# Patient Record
Sex: Male | Born: 1960 | Race: White | Hispanic: No | Marital: Married | Smoking: Former smoker
Health system: Southern US, Community
[De-identification: ages and names within clinical notes are randomized; demographics above are authoritative.]

## PROBLEM LIST (undated history)

## (undated) DIAGNOSIS — M1990 Unspecified osteoarthritis, unspecified site: Secondary | ICD-10-CM

## (undated) DIAGNOSIS — I10 Essential (primary) hypertension: Secondary | ICD-10-CM

## (undated) DIAGNOSIS — C884 Extranodal marginal zone b-cell lymphoma of mucosa-associated lymphoid tissue (malt-lymphoma) not having achieved remission: Secondary | ICD-10-CM

## (undated) DIAGNOSIS — E119 Type 2 diabetes mellitus without complications: Secondary | ICD-10-CM

## (undated) DIAGNOSIS — C801 Malignant (primary) neoplasm, unspecified: Secondary | ICD-10-CM

## (undated) DIAGNOSIS — G2581 Restless legs syndrome: Secondary | ICD-10-CM

## (undated) DIAGNOSIS — E669 Obesity, unspecified: Secondary | ICD-10-CM

## (undated) DIAGNOSIS — G894 Chronic pain syndrome: Secondary | ICD-10-CM

## (undated) DIAGNOSIS — E785 Hyperlipidemia, unspecified: Secondary | ICD-10-CM

## (undated) DIAGNOSIS — I2510 Atherosclerotic heart disease of native coronary artery without angina pectoris: Secondary | ICD-10-CM

## (undated) DIAGNOSIS — R918 Other nonspecific abnormal finding of lung field: Secondary | ICD-10-CM

## (undated) DIAGNOSIS — N184 Chronic kidney disease, stage 4 (severe): Secondary | ICD-10-CM

## (undated) DIAGNOSIS — I700 Atherosclerosis of aorta: Secondary | ICD-10-CM

## (undated) DIAGNOSIS — E041 Nontoxic single thyroid nodule: Secondary | ICD-10-CM

## (undated) DIAGNOSIS — G629 Polyneuropathy, unspecified: Secondary | ICD-10-CM

## (undated) DIAGNOSIS — I509 Heart failure, unspecified: Secondary | ICD-10-CM

## (undated) HISTORY — DX: Hyperlipidemia, unspecified: E78.5

## (undated) HISTORY — DX: Extranodal marginal zone B-cell lymphoma of mucosa-associated lymphoid tissue (MALT-lymphoma): C88.4

## (undated) HISTORY — DX: Type 2 diabetes mellitus without complications: E11.9

## (undated) HISTORY — PX: EYE SURGERY: SHX253

## (undated) HISTORY — DX: Chronic pain syndrome: G89.4

## (undated) HISTORY — PX: PERITONEAL CATHETER REMOVAL: SUR1106

## (undated) HISTORY — DX: Restless legs syndrome: G25.81

## (undated) HISTORY — DX: Essential (primary) hypertension: I10

## (undated) HISTORY — DX: Other nonspecific abnormal finding of lung field: R91.8

## (undated) HISTORY — DX: Polyneuropathy, unspecified: G62.9

## (undated) HISTORY — PX: LUNG LOBECTOMY: SHX167

## (undated) HISTORY — DX: Nontoxic single thyroid nodule: E04.1

## (undated) HISTORY — PX: MOUTH SURGERY: SHX715

## (undated) HISTORY — DX: Extranodal marginal zone b-cell lymphoma of mucosa-associated lymphoid tissue (malt-lymphoma) not having achieved remission: C88.40

## (undated) HISTORY — PX: POLYPECTOMY: SHX149

## (undated) HISTORY — PX: EYE EXAMINATION UNDER ANESTHESIA W/ RETINAL CRYOTHERAPY AND RETINAL LASER: SHX1561

## (undated) HISTORY — DX: Atherosclerosis of aorta: I70.0

## (undated) HISTORY — PX: PERITONEAL CATHETER INSERTION: SHX2223

## (undated) HISTORY — DX: Unspecified osteoarthritis, unspecified site: M19.90

## (undated) HISTORY — PX: TONSILLECTOMY: SUR1361

## (undated) HISTORY — DX: Atherosclerotic heart disease of native coronary artery without angina pectoris: I25.10

## (undated) HISTORY — DX: Obesity, unspecified: E66.9

---

## 2007-12-02 HISTORY — PX: CERVICAL SPINE SURGERY: SHX589

## 2011-05-17 ENCOUNTER — Institutional Professional Consult (permissible substitution) (INDEPENDENT_AMBULATORY_CARE_PROVIDER_SITE_OTHER): Payer: Medicare Other | Admitting: Cardiothoracic Surgery

## 2011-05-17 ENCOUNTER — Other Ambulatory Visit: Payer: Self-pay | Admitting: Cardiothoracic Surgery

## 2011-05-17 ENCOUNTER — Encounter: Payer: Self-pay | Admitting: Cardiothoracic Surgery

## 2011-05-17 ENCOUNTER — Encounter: Payer: Medicare Other | Admitting: Cardiothoracic Surgery

## 2011-05-17 VITALS — BP 157/89 | HR 66 | Temp 98.0°F | Resp 16 | Ht 71.0 in | Wt 250.0 lb

## 2011-05-17 DIAGNOSIS — D381 Neoplasm of uncertain behavior of trachea, bronchus and lung: Secondary | ICD-10-CM

## 2011-05-17 DIAGNOSIS — G2581 Restless legs syndrome: Secondary | ICD-10-CM

## 2011-05-17 DIAGNOSIS — R911 Solitary pulmonary nodule: Secondary | ICD-10-CM | POA: Insufficient documentation

## 2011-05-17 DIAGNOSIS — I10 Essential (primary) hypertension: Secondary | ICD-10-CM

## 2011-05-17 DIAGNOSIS — I700 Atherosclerosis of aorta: Secondary | ICD-10-CM | POA: Insufficient documentation

## 2011-05-17 DIAGNOSIS — E119 Type 2 diabetes mellitus without complications: Secondary | ICD-10-CM

## 2011-05-17 DIAGNOSIS — R918 Other nonspecific abnormal finding of lung field: Secondary | ICD-10-CM

## 2011-05-17 DIAGNOSIS — G894 Chronic pain syndrome: Secondary | ICD-10-CM

## 2011-05-17 DIAGNOSIS — E785 Hyperlipidemia, unspecified: Secondary | ICD-10-CM | POA: Insufficient documentation

## 2011-05-17 DIAGNOSIS — E041 Nontoxic single thyroid nodule: Secondary | ICD-10-CM

## 2011-05-17 DIAGNOSIS — E669 Obesity, unspecified: Secondary | ICD-10-CM

## 2011-05-17 DIAGNOSIS — M1990 Unspecified osteoarthritis, unspecified site: Secondary | ICD-10-CM

## 2011-05-17 DIAGNOSIS — G629 Polyneuropathy, unspecified: Secondary | ICD-10-CM
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
## 2011-05-17 NOTE — Patient Instructions (Addendum)
Call your primary MD for treatment of gout. Stop taking amitriptyline. Return to office after PET scan to discuss lung surgery.

## 2011-05-23 ENCOUNTER — Encounter (HOSPITAL_COMMUNITY)
Admission: RE | Admit: 2011-05-23 | Discharge: 2011-05-23 | Disposition: A | Discharge status: Home / Self Care | Payer: Medicare Other | Source: Ambulatory Visit | Attending: Cardiothoracic Surgery | Admitting: Cardiothoracic Surgery

## 2011-05-23 ENCOUNTER — Encounter (HOSPITAL_COMMUNITY): Payer: Self-pay

## 2011-05-23 DIAGNOSIS — J984 Other disorders of lung: Secondary | ICD-10-CM | POA: Insufficient documentation

## 2011-05-23 DIAGNOSIS — D381 Neoplasm of uncertain behavior of trachea, bronchus and lung: Secondary | ICD-10-CM

## 2011-05-23 DIAGNOSIS — R222 Localized swelling, mass and lump, trunk: Secondary | ICD-10-CM | POA: Insufficient documentation

## 2011-05-23 LAB — GLUCOSE, CAPILLARY: Glucose-Capillary: 163 mg/dL — ABNORMAL HIGH (ref 70–99)

## 2011-05-23 MED ORDER — FLUDEOXYGLUCOSE F - 18 (FDG) INJECTION
18.5000 | Freq: Once | INTRAVENOUS | Status: AC | PRN
  Administered 2011-05-23: 18.5 via INTRAVENOUS

## 2011-05-24 ENCOUNTER — Ambulatory Visit (INDEPENDENT_AMBULATORY_CARE_PROVIDER_SITE_OTHER): Payer: Medicare Other | Admitting: Cardiothoracic Surgery

## 2011-05-24 VITALS — BP 163/82 | HR 67 | Resp 17 | Ht 71.0 in | Wt 259.0 lb

## 2011-05-24 DIAGNOSIS — R918 Other nonspecific abnormal finding of lung field: Secondary | ICD-10-CM

## 2011-05-24 DIAGNOSIS — J984 Other disorders of lung: Secondary | ICD-10-CM

## 2011-05-24 DIAGNOSIS — R911 Solitary pulmonary nodule: Secondary | ICD-10-CM

## 2011-05-24 NOTE — Progress Notes (Signed)
HPI  The patient returns for followup discussion of a recently diagnosed 1.5 cm nodule in the superior segment of the right lower lobe. He is a nonsmoker x20 years. Since his original consult he has undergone a PET scan. The PET scan is reviewed with the patient and shows no hypermetabolic activity of the solitary lung nodule. This is consistent with broncho- alveolar type lung cancer. Of importance the mediastinal lymph nodes were all negative. Also of note the right thyroid nodule the patient was recent diagnosed with is also negative on PET scan.  Current Outpatient Prescriptions  Medication Sig Dispense Refill  . aspirin 81 MG tablet Take 81 mg by mouth daily.        . febuxostat (ULORIC) 40 MG tablet Take 80 mg by mouth daily.        . furosemide (LASIX) 80 MG tablet Take 80 mg by mouth daily.        Marland Kitchen gabapentin (NEURONTIN) 600 MG tablet Take 600 mg by mouth 3 (three) times daily.        . insulin aspart (NOVOLOG) 100 UNIT/ML injection Inject into the skin 3 (three) times daily before meals.        . insulin aspart protamine-insulin aspart (NOVOLOG 70/30) (70-30) 100 UNIT/ML injection Inject 35 Units into the skin 2 (two) times daily with a meal.        . insulin glargine (LANTUS) 100 UNIT/ML injection Inject into the skin at bedtime. 54untis am and 80units pm       . lisinopril (PRINIVIL,ZESTRIL) 40 MG tablet Take 40 mg by mouth daily.        . metFORMIN (GLUCOPHAGE) 1000 MG tablet Take 1,000 mg by mouth 2 (two) times daily with a meal.        . nebivolol (BYSTOLIC) 10 MG tablet Take 10 mg by mouth daily.        . nitroGLYCERIN (NITROLINGUAL) 0.4 MG/SPRAY spray Place 1 spray under the tongue every 5 (five) minutes as needed.        Marland Kitchen oxyCODONE (OXY IR/ROXICODONE) 5 MG immediate release tablet Take 5 mg by mouth every 6 (six) hours as needed.        . Tamsulosin HCl (FLOMAX) 0.4 MG CAPS Take 0.4 mg by mouth daily.           Review of Systems: No pulmonary symptoms. No change.  Physical  Exam  Blood pressure 158/80 pulse 60 and regular saturation rare 97% General appearance is a middle-aged Caucasian male no distress. Neck is without adenopathy palpable. Cardiac rhythm is regular without gallop. Breath sounds are clear and equal. Neuro exam is intact.   Diagnostic Tests: The PET scan is reviewed with the patient as noted above.   Impression: Solitary nodule in the superior segment right lower lobe with negative activity and PET scan is consistent with a broncoalveolar or type lung cancer.  Plan: I discussed the golden impressed with the patient. Offered a repeat CT scan in 3-6 months time for comparison e wishes to have this nodule removed which is a reasonable option.  He is in the process of buying and moving into new home so we will 22nd right vats segmental resection of the superior segment right lower lobe. The patient will return for a preop office visit on October 10.

## 2011-05-29 ENCOUNTER — Encounter: Payer: Medicare Other | Admitting: Cardiothoracic Surgery

## 2011-06-04 ENCOUNTER — Other Ambulatory Visit: Payer: Self-pay | Admitting: Cardiothoracic Surgery

## 2011-06-04 DIAGNOSIS — D381 Neoplasm of uncertain behavior of trachea, bronchus and lung: Secondary | ICD-10-CM

## 2011-06-12 ENCOUNTER — Ambulatory Visit (INDEPENDENT_AMBULATORY_CARE_PROVIDER_SITE_OTHER): Payer: Medicare Other | Admitting: Cardiothoracic Surgery

## 2011-06-12 ENCOUNTER — Ambulatory Visit: Payer: Medicare Other | Admitting: Cardiothoracic Surgery

## 2011-06-12 ENCOUNTER — Encounter: Payer: Self-pay | Admitting: Cardiothoracic Surgery

## 2011-06-12 ENCOUNTER — Ambulatory Visit
Admission: RE | Admit: 2011-06-12 | Discharge: 2011-06-12 | Disposition: A | Discharge status: Home / Self Care | Payer: Medicare Other | Source: Ambulatory Visit | Attending: Cardiothoracic Surgery | Admitting: Cardiothoracic Surgery

## 2011-06-12 VITALS — BP 132/76 | HR 80 | Resp 20 | Ht 71.0 in | Wt 254.0 lb

## 2011-06-12 DIAGNOSIS — J984 Other disorders of lung: Secondary | ICD-10-CM

## 2011-06-12 DIAGNOSIS — R911 Solitary pulmonary nodule: Secondary | ICD-10-CM

## 2011-06-12 DIAGNOSIS — D381 Neoplasm of uncertain behavior of trachea, bronchus and lung: Secondary | ICD-10-CM

## 2011-06-12 NOTE — Patient Instructions (Signed)
Stop aspirin a week before surgery 
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
## 2011-06-20 ENCOUNTER — Encounter (HOSPITAL_COMMUNITY)
Admission: RE | Admit: 2011-06-20 | Discharge: 2011-06-20 | Disposition: A | Discharge status: Home / Self Care | Payer: Medicare Other | Source: Ambulatory Visit | Attending: Cardiothoracic Surgery | Admitting: Cardiothoracic Surgery

## 2011-06-20 ENCOUNTER — Other Ambulatory Visit: Payer: Self-pay | Admitting: Cardiothoracic Surgery

## 2011-06-20 DIAGNOSIS — Z01811 Encounter for preprocedural respiratory examination: Secondary | ICD-10-CM

## 2011-06-20 LAB — ABO/RH: ABO/RH(D): A POS

## 2011-06-20 LAB — COMPREHENSIVE METABOLIC PANEL
ALT: 36 U/L (ref 0–53)
AST: 23 U/L (ref 0–37)
Albumin: 3.5 g/dL (ref 3.5–5.2)
Alkaline Phosphatase: 76 U/L (ref 39–117)
BUN: 21 mg/dL (ref 6–23)
CO2: 26 mEq/L (ref 19–32)
Calcium: 9.9 mg/dL (ref 8.4–10.5)
Chloride: 102 mEq/L (ref 96–112)
Creatinine, Ser: 1.09 mg/dL (ref 0.50–1.35)
GFR calc Af Amer: 88 mL/min — ABNORMAL LOW (ref >90)
GFR calc non Af Amer: 76 mL/min — ABNORMAL LOW (ref >90)
Glucose, Bld: 171 mg/dL — ABNORMAL HIGH (ref 70–99)
Potassium: 3.8 mEq/L (ref 3.5–5.1)
Sodium: 141 mEq/L (ref 135–145)
Total Bilirubin: 0.4 mg/dL (ref 0.3–1.2)
Total Protein: 6.7 g/dL (ref 6.0–8.3)

## 2011-06-20 LAB — SURGICAL PCR SCREEN
MRSA, PCR: NEGATIVE
Staphylococcus aureus: NEGATIVE

## 2011-06-20 LAB — CBC
HCT: 35.9 % — ABNORMAL LOW (ref 39.0–52.0)
Hemoglobin: 13.2 g/dL (ref 13.0–17.0)
MCH: 31.4 pg (ref 26.0–34.0)
MCHC: 36.8 g/dL — ABNORMAL HIGH (ref 30.0–36.0)
MCV: 85.5 fL (ref 78.0–100.0)
Platelets: 230 10*3/uL (ref 150–400)
RBC: 4.2 MIL/uL — ABNORMAL LOW (ref 4.22–5.81)
RDW: 13.1 % (ref 11.5–15.5)
WBC: 10.6 10*3/uL — ABNORMAL HIGH (ref 4.0–10.5)

## 2011-06-20 LAB — URINALYSIS, ROUTINE W REFLEX MICROSCOPIC
Bilirubin Urine: NEGATIVE
Glucose, UA: NEGATIVE mg/dL
Hgb urine dipstick: NEGATIVE
Ketones, ur: NEGATIVE mg/dL
Leukocytes, UA: NEGATIVE
Nitrite: NEGATIVE
Protein, ur: 100 mg/dL — AB
Specific Gravity, Urine: 1.011 (ref 1.005–1.030)
Urobilinogen, UA: 0.2 mg/dL (ref 0.0–1.0)
pH: 5 (ref 5.0–8.0)

## 2011-06-20 LAB — URINE MICROSCOPIC-ADD ON

## 2011-06-20 LAB — BLOOD GAS, ARTERIAL
Acid-Base Excess: 2.8 mmol/L — ABNORMAL HIGH (ref 0.0–2.0)
Bicarbonate: 26.5 mEq/L — ABNORMAL HIGH (ref 20.0–24.0)
Drawn by: 344381
FIO2: 21 %
O2 Saturation: 99.8 %
Patient temperature: 98.6
TCO2: 27.7 mmol/L (ref 0–100)
pCO2 arterial: 38.5 mmHg (ref 35.0–45.0)
pH, Arterial: 7.451 — ABNORMAL HIGH (ref 7.350–7.450)
pO2, Arterial: 115 mmHg — ABNORMAL HIGH (ref 80.0–100.0)

## 2011-06-20 LAB — APTT: aPTT: 29 seconds (ref 24–37)

## 2011-06-20 LAB — PROTIME-INR
INR: 0.99 (ref 0.00–1.49)
Prothrombin Time: 13.3 seconds (ref 11.6–15.2)

## 2011-06-24 ENCOUNTER — Other Ambulatory Visit: Payer: Self-pay | Admitting: Cardiothoracic Surgery

## 2011-06-24 ENCOUNTER — Inpatient Hospital Stay (HOSPITAL_COMMUNITY): Payer: Medicare Other

## 2011-06-24 ENCOUNTER — Inpatient Hospital Stay (HOSPITAL_COMMUNITY)
Admission: RE | Admit: 2011-06-24 | Discharge: 2011-06-28 | DRG: 822 | Disposition: A | Discharge status: Home / Self Care | Payer: Medicare Other | Source: Ambulatory Visit | Attending: Cardiothoracic Surgery | Admitting: Cardiothoracic Surgery

## 2011-06-24 DIAGNOSIS — Z01818 Encounter for other preprocedural examination: Secondary | ICD-10-CM

## 2011-06-24 DIAGNOSIS — D381 Neoplasm of uncertain behavior of trachea, bronchus and lung: Secondary | ICD-10-CM

## 2011-06-24 DIAGNOSIS — Z87891 Personal history of nicotine dependence: Secondary | ICD-10-CM

## 2011-06-24 DIAGNOSIS — Z7982 Long term (current) use of aspirin: Secondary | ICD-10-CM

## 2011-06-24 DIAGNOSIS — E669 Obesity, unspecified: Secondary | ICD-10-CM | POA: Diagnosis present

## 2011-06-24 DIAGNOSIS — Z01812 Encounter for preprocedural laboratory examination: Secondary | ICD-10-CM

## 2011-06-24 DIAGNOSIS — Z79899 Other long term (current) drug therapy: Secondary | ICD-10-CM

## 2011-06-24 DIAGNOSIS — I10 Essential (primary) hypertension: Secondary | ICD-10-CM | POA: Diagnosis present

## 2011-06-24 DIAGNOSIS — I700 Atherosclerosis of aorta: Secondary | ICD-10-CM | POA: Diagnosis present

## 2011-06-24 DIAGNOSIS — G2581 Restless legs syndrome: Secondary | ICD-10-CM | POA: Diagnosis present

## 2011-06-24 DIAGNOSIS — Z01810 Encounter for preprocedural cardiovascular examination: Secondary | ICD-10-CM

## 2011-06-24 DIAGNOSIS — E119 Type 2 diabetes mellitus without complications: Secondary | ICD-10-CM | POA: Diagnosis present

## 2011-06-24 DIAGNOSIS — G894 Chronic pain syndrome: Secondary | ICD-10-CM | POA: Diagnosis present

## 2011-06-24 DIAGNOSIS — C884 Extranodal marginal zone b-cell lymphoma of mucosa-associated lymphoid tissue (malt-lymphoma) not having achieved remission: Secondary | ICD-10-CM

## 2011-06-24 DIAGNOSIS — E785 Hyperlipidemia, unspecified: Secondary | ICD-10-CM | POA: Diagnosis present

## 2011-06-24 DIAGNOSIS — C8589 Other specified types of non-Hodgkin lymphoma, extranodal and solid organ sites: Principal | ICD-10-CM | POA: Diagnosis present

## 2011-06-24 DIAGNOSIS — Z794 Long term (current) use of insulin: Secondary | ICD-10-CM

## 2011-06-24 HISTORY — DX: Extranodal marginal zone B-cell lymphoma of mucosa-associated lymphoid tissue (MALT-lymphoma): C88.4

## 2011-06-24 HISTORY — DX: Extranodal marginal zone b-cell lymphoma of mucosa-associated lymphoid tissue (malt-lymphoma) not having achieved remission: C88.40

## 2011-06-24 LAB — POCT I-STAT 3, ART BLOOD GAS (G3+)
Acid-Base Excess: 3 mmol/L — ABNORMAL HIGH (ref 0.0–2.0)
Bicarbonate: 28.8 mEq/L — ABNORMAL HIGH (ref 20.0–24.0)
O2 Saturation: 98 %
Patient temperature: 97.5
TCO2: 30 mmol/L (ref 0–100)
pCO2 arterial: 46.9 mmHg — ABNORMAL HIGH (ref 35.0–45.0)
pH, Arterial: 7.394 (ref 7.350–7.450)
pO2, Arterial: 97 mmHg (ref 80.0–100.0)

## 2011-06-24 LAB — BLOOD GAS, ARTERIAL
Acid-base deficit: 1 mmol/L (ref 0.0–2.0)
Bicarbonate: 24.6 mEq/L — ABNORMAL HIGH (ref 20.0–24.0)
Drawn by: 296031
O2 Content: 2 L/min
O2 Saturation: 97.9 %
Patient temperature: 98.6
TCO2: 26.3 mmol/L (ref 0–100)
pCO2 arterial: 52.6 mmHg — ABNORMAL HIGH (ref 35.0–45.0)
pH, Arterial: 7.292 — ABNORMAL LOW (ref 7.350–7.450)
pO2, Arterial: 99.5 mmHg (ref 80.0–100.0)

## 2011-06-24 LAB — GLUCOSE, CAPILLARY
Glucose-Capillary: 302 mg/dL — ABNORMAL HIGH (ref 70–99)
Glucose-Capillary: 265 mg/dL — ABNORMAL HIGH (ref 70–99)
Glucose-Capillary: 231 mg/dL — ABNORMAL HIGH (ref 70–99)
Glucose-Capillary: 211 mg/dL — ABNORMAL HIGH (ref 70–99)
Glucose-Capillary: 188 mg/dL — ABNORMAL HIGH (ref 70–99)

## 2011-06-25 ENCOUNTER — Inpatient Hospital Stay (HOSPITAL_COMMUNITY): Payer: Medicare Other

## 2011-06-25 DIAGNOSIS — E1165 Type 2 diabetes mellitus with hyperglycemia: Secondary | ICD-10-CM

## 2011-06-25 DIAGNOSIS — IMO0001 Reserved for inherently not codable concepts without codable children: Secondary | ICD-10-CM

## 2011-06-25 LAB — TYPE AND SCREEN
ABO/RH(D): A POS
Antibody Screen: NEGATIVE
Unit division: 0
Unit division: 0

## 2011-06-25 LAB — BASIC METABOLIC PANEL
BUN: 27 mg/dL — ABNORMAL HIGH (ref 6–23)
CO2: 28 mEq/L (ref 19–32)
Calcium: 9.1 mg/dL (ref 8.4–10.5)
Chloride: 101 mEq/L (ref 96–112)
Creatinine, Ser: 1.13 mg/dL (ref 0.50–1.35)
GFR calc Af Amer: 85 mL/min — ABNORMAL LOW (ref >90)
GFR calc non Af Amer: 73 mL/min — ABNORMAL LOW (ref >90)
Glucose, Bld: 180 mg/dL — ABNORMAL HIGH (ref 70–99)
Potassium: 4.2 mEq/L (ref 3.5–5.1)
Sodium: 138 mEq/L (ref 135–145)

## 2011-06-25 LAB — GLUCOSE, CAPILLARY
Glucose-Capillary: 158 mg/dL — ABNORMAL HIGH (ref 70–99)
Glucose-Capillary: 147 mg/dL — ABNORMAL HIGH (ref 70–99)
Glucose-Capillary: 209 mg/dL — ABNORMAL HIGH (ref 70–99)
Glucose-Capillary: 166 mg/dL — ABNORMAL HIGH (ref 70–99)
Glucose-Capillary: 234 mg/dL — ABNORMAL HIGH (ref 70–99)

## 2011-06-25 LAB — CBC
HCT: 33 % — ABNORMAL LOW (ref 39.0–52.0)
Hemoglobin: 11.7 g/dL — ABNORMAL LOW (ref 13.0–17.0)
MCH: 30.8 pg (ref 26.0–34.0)
MCHC: 35.5 g/dL (ref 30.0–36.0)
MCV: 86.8 fL (ref 78.0–100.0)
Platelets: 179 10*3/uL (ref 150–400)
RBC: 3.8 MIL/uL — ABNORMAL LOW (ref 4.22–5.81)
RDW: 13.3 % (ref 11.5–15.5)
WBC: 10.9 10*3/uL — ABNORMAL HIGH (ref 4.0–10.5)

## 2011-06-25 LAB — POCT I-STAT 3, ART BLOOD GAS (G3+)
Acid-Base Excess: 3 mmol/L — ABNORMAL HIGH (ref 0.0–2.0)
Bicarbonate: 29 mEq/L — ABNORMAL HIGH (ref 20.0–24.0)
O2 Saturation: 97 %
Patient temperature: 99.5
TCO2: 31 mmol/L (ref 0–100)
pCO2 arterial: 52.8 mmHg — ABNORMAL HIGH (ref 35.0–45.0)
pH, Arterial: 7.35 (ref 7.350–7.450)
pO2, Arterial: 94 mmHg (ref 80.0–100.0)

## 2011-06-26 ENCOUNTER — Inpatient Hospital Stay (HOSPITAL_COMMUNITY): Payer: Medicare Other

## 2011-06-26 DIAGNOSIS — IMO0001 Reserved for inherently not codable concepts without codable children: Secondary | ICD-10-CM

## 2011-06-26 DIAGNOSIS — E1165 Type 2 diabetes mellitus with hyperglycemia: Secondary | ICD-10-CM

## 2011-06-26 LAB — CBC
HCT: 34.9 % — ABNORMAL LOW (ref 39.0–52.0)
Hemoglobin: 12.1 g/dL — ABNORMAL LOW (ref 13.0–17.0)
MCH: 31.1 pg (ref 26.0–34.0)
MCHC: 34.7 g/dL (ref 30.0–36.0)
MCV: 89.7 fL (ref 78.0–100.0)
Platelets: 180 10*3/uL (ref 150–400)
RBC: 3.89 MIL/uL — ABNORMAL LOW (ref 4.22–5.81)
RDW: 13.6 % (ref 11.5–15.5)
WBC: 13.5 10*3/uL — ABNORMAL HIGH (ref 4.0–10.5)

## 2011-06-26 LAB — COMPREHENSIVE METABOLIC PANEL
ALT: 25 U/L (ref 0–53)
AST: 33 U/L (ref 0–37)
Albumin: 3.3 g/dL — ABNORMAL LOW (ref 3.5–5.2)
Alkaline Phosphatase: 66 U/L (ref 39–117)
BUN: 33 mg/dL — ABNORMAL HIGH (ref 6–23)
CO2: 30 mEq/L (ref 19–32)
Calcium: 9.4 mg/dL (ref 8.4–10.5)
Chloride: 98 mEq/L (ref 96–112)
Creatinine, Ser: 1.3 mg/dL (ref 0.50–1.35)
GFR calc Af Amer: 71 mL/min — ABNORMAL LOW (ref >90)
GFR calc non Af Amer: 62 mL/min — ABNORMAL LOW (ref >90)
Glucose, Bld: 157 mg/dL — ABNORMAL HIGH (ref 70–99)
Potassium: 4.4 mEq/L (ref 3.5–5.1)
Sodium: 136 mEq/L (ref 135–145)
Total Bilirubin: 0.9 mg/dL (ref 0.3–1.2)
Total Protein: 7 g/dL (ref 6.0–8.3)

## 2011-06-26 LAB — GLUCOSE, CAPILLARY
Glucose-Capillary: 159 mg/dL — ABNORMAL HIGH (ref 70–99)
Glucose-Capillary: 147 mg/dL — ABNORMAL HIGH (ref 70–99)
Glucose-Capillary: 164 mg/dL — ABNORMAL HIGH (ref 70–99)
Glucose-Capillary: 180 mg/dL — ABNORMAL HIGH (ref 70–99)
Glucose-Capillary: 231 mg/dL — ABNORMAL HIGH (ref 70–99)
Glucose-Capillary: 171 mg/dL — ABNORMAL HIGH (ref 70–99)

## 2011-06-26 LAB — HEMOGLOBIN A1C
Hgb A1c MFr Bld: 6.6 % — ABNORMAL HIGH (ref <5.7)
Mean Plasma Glucose: 143 mg/dL — ABNORMAL HIGH (ref <117)

## 2011-06-27 ENCOUNTER — Inpatient Hospital Stay (HOSPITAL_COMMUNITY): Payer: Medicare Other

## 2011-06-27 DIAGNOSIS — IMO0001 Reserved for inherently not codable concepts without codable children: Secondary | ICD-10-CM

## 2011-06-27 DIAGNOSIS — E1165 Type 2 diabetes mellitus with hyperglycemia: Secondary | ICD-10-CM

## 2011-06-27 LAB — BASIC METABOLIC PANEL
BUN: 44 mg/dL — ABNORMAL HIGH (ref 6–23)
CO2: 30 mEq/L (ref 19–32)
Calcium: 9.3 mg/dL (ref 8.4–10.5)
Chloride: 100 mEq/L (ref 96–112)
Creatinine, Ser: 1.38 mg/dL — ABNORMAL HIGH (ref 0.50–1.35)
GFR calc Af Amer: 66 mL/min — ABNORMAL LOW (ref >90)
GFR calc non Af Amer: 57 mL/min — ABNORMAL LOW (ref >90)
Glucose, Bld: 179 mg/dL — ABNORMAL HIGH (ref 70–99)
Potassium: 4.4 mEq/L (ref 3.5–5.1)
Sodium: 137 mEq/L (ref 135–145)

## 2011-06-27 LAB — GLUCOSE, CAPILLARY
Glucose-Capillary: 204 mg/dL — ABNORMAL HIGH (ref 70–99)
Glucose-Capillary: 181 mg/dL — ABNORMAL HIGH (ref 70–99)
Glucose-Capillary: 212 mg/dL — ABNORMAL HIGH (ref 70–99)
Glucose-Capillary: 102 mg/dL — ABNORMAL HIGH (ref 70–99)

## 2011-06-28 ENCOUNTER — Inpatient Hospital Stay (HOSPITAL_COMMUNITY): Payer: Medicare Other

## 2011-06-28 ENCOUNTER — Encounter: Payer: Medicare Other | Admitting: Oncology

## 2011-06-28 LAB — GLUCOSE, CAPILLARY: Glucose-Capillary: 196 mg/dL — ABNORMAL HIGH (ref 70–99)

## 2011-07-01 LAB — POCT I-STAT GLUCOSE
Glucose, Bld: 262 mg/dL — ABNORMAL HIGH (ref 70–99)
Operator id: 195151

## 2011-07-04 ENCOUNTER — Ambulatory Visit (INDEPENDENT_AMBULATORY_CARE_PROVIDER_SITE_OTHER): Payer: Self-pay | Admitting: *Deleted

## 2011-07-04 DIAGNOSIS — C3430 Malignant neoplasm of lower lobe, unspecified bronchus or lung: Secondary | ICD-10-CM

## 2011-07-04 NOTE — Progress Notes (Signed)
Mr, Licon returns to the office for suture removal s/p (R)video-assisted thoracci surgery, wedge resection of right lower lobe superior segment nodule, mediastinall lymph node dissection on 06/24/11 by Dr. Dahlia Byes.  The thoracotomy incision is well healed, asa well as the vats incisions.  I removed the one ct suture without difficulty from a well healed site.  He is using the Oxycodone q6hrs and keeps him at a level 5 pain score.  I told him he could use it every 4 hrs to better manage his pain if he wishes.  Lungs are cta, breathing easily.  Walking at home.  Appetite and bowels ok.  He will return next week to see md with a cxr.

## 2011-07-08 ENCOUNTER — Other Ambulatory Visit: Payer: Self-pay | Admitting: *Deleted

## 2011-07-08 ENCOUNTER — Encounter: Payer: Self-pay | Admitting: *Deleted

## 2011-07-08 ENCOUNTER — Telehealth: Payer: Self-pay | Admitting: *Deleted

## 2011-07-08 ENCOUNTER — Other Ambulatory Visit: Payer: Self-pay | Admitting: Cardiothoracic Surgery

## 2011-07-08 DIAGNOSIS — D381 Neoplasm of uncertain behavior of trachea, bronchus and lung: Secondary | ICD-10-CM

## 2011-07-08 DIAGNOSIS — R52 Pain, unspecified: Secondary | ICD-10-CM

## 2011-07-08 MED ORDER — HYDROCODONE-ACETAMINOPHEN 7.5-500 MG PO TABS: 1.0000 | ORAL_TABLET | ORAL | Status: DC | PRN

## 2011-07-08 NOTE — Telephone Encounter (Signed)
I called Hydrocodone 7.5/500 to his pharmacy.

## 2011-07-09 ENCOUNTER — Telehealth: Payer: Self-pay | Admitting: Oncology

## 2011-07-09 NOTE — Telephone Encounter (Signed)
gv pt new pt appt for 11/26 @ 9:30 am w/JG. D/t/duration per Lavone Nian.

## 2011-07-10 ENCOUNTER — Ambulatory Visit (INDEPENDENT_AMBULATORY_CARE_PROVIDER_SITE_OTHER): Payer: Self-pay | Admitting: Cardiothoracic Surgery

## 2011-07-10 ENCOUNTER — Encounter: Payer: Self-pay | Admitting: Cardiothoracic Surgery

## 2011-07-10 ENCOUNTER — Ambulatory Visit
Admission: RE | Admit: 2011-07-10 | Discharge: 2011-07-10 | Disposition: A | Discharge status: Home / Self Care | Payer: Medicare Other | Source: Ambulatory Visit | Attending: Cardiothoracic Surgery | Admitting: Cardiothoracic Surgery

## 2011-07-10 VITALS — BP 105/64 | HR 60 | Resp 18 | Ht 70.0 in | Wt 249.0 lb

## 2011-07-10 DIAGNOSIS — D381 Neoplasm of uncertain behavior of trachea, bronchus and lung: Secondary | ICD-10-CM

## 2011-07-10 DIAGNOSIS — J984 Other disorders of lung: Secondary | ICD-10-CM

## 2011-07-10 DIAGNOSIS — C8581 Other specified types of non-Hodgkin lymphoma, lymph nodes of head, face, and neck: Secondary | ICD-10-CM

## 2011-07-10 DIAGNOSIS — Z09 Encounter for follow-up examination after completed treatment for conditions other than malignant neoplasm: Secondary | ICD-10-CM

## 2011-07-10 NOTE — Op Note (Signed)
NAMEMarland Kitchen  Curtis Mays, Curtis Mays NO.:  [id number]  MEDICAL RECORD NO.:  [id number]  LOCATION:  2316                         FACILITY:  [facility]  PHYSICIAN:  Ivin Poot, M.D.  DATE OF BIRTH:  [birth year]  DATE OF PROCEDURE:  06/24/2011 DATE OF DISCHARGE:                              OPERATIVE REPORT   OPERATION: 1. Right video-assisted thoracic surgery, wedge resection of right     lower lobe superior segment nodule. 2. Mediastinal lymph node dissection. 3. Placement of On-Q wound analgesia irrigation system.  SURGEON:  Ivin Poot, MD  ASSISTANT:  Suzzanne Cloud, PA  PREOPERATIVE DIAGNOSIS:  A 1.7-mm nodule in superior segment of right lower lobe.  POSTOPERATIVE DIAGNOSIS:  A 1.7-mm nodule in superior segment of right lower lobe.  INDICATIONS:  The patient is a 50 year old obese Caucasian diabetic ex- smoker who presents with recently discovered incidental finding of a 1.7- cm nodule in the superior segment of the right lower lobe.  He stopped smoking over 20 years ago.  He has no pulmonary symptoms.  He recently had surgery on the C-spine at [facility] and is recovering from that procedure.  A PET scan showed this to have no metabolic activity, and there is no abnormal metabolic activity in the mediastinal lymph nodes.  A recent head CT scan was negative.  The patient was offered the choice of either following this with serial scans or excisional biopsy. It is his preference that the abnormality be removed, and we went ahead and scheduled this operation.  In the office, I discussed indications, benefits and risks of right VATS, wedge resection of the nodule in the superior segment of the right lower lobe.  I discussed the major aspects of the surgery including the location of the surgical incision, the use of postoperative chest tube, and expected postoperative hospital recovery.  He understood that he would have incisional pain managed by PCA pump as  well supplemental oral medications.  He understood the risks of bleeding, pneumonia, air leak, ventilator dependence, and death. After reviewing these issues, he demonstrated his understanding and agreed to proceed with the surgery under what I felt was an informed consent.  OPERATIVE FINDINGS:  A generous wedge resection of the tumor was performed.  The frozen section on this appeared to be a lymphoid tumor possible lymphoma with clear margins.  PROCEDURE:  The patient was brought to the operating room, placed supine on the operating table where general anesthesia was induced.  A double- lumen endotracheal tube was positioned by the anesthesiologist.  The patient was then turned to expose the right chest which was prepped and draped as a sterile field.  A proper time-out was performed to confirm proper patient, proper site, and proper operation.  After the patient was prepped and draped, a small 1 inch incision was made in the fifth interspace, through which a VATS port was inserted and the camera was used for surveillance of the right chest.  There were no adhesions. There were no abnormalities noted on the surface of the lung.  The camera was withdrawn.  The incision was extended in the fifth interspace, and a retractor was placed to gently  retract the ribs.  Using the camera as well as the VATS endoscopic instrument, the lung was mobilized and carefully examined.  I was able to reach in and feel the superior segment and there was a firm nodule at the location of the CT scan.  The fissure between the upper lobe and lower lobe was extended with an Endo-GIA stapling device. Several more lows in the GI stapling device were used to perform a generous wedge shaped resection of the superior segment including the nodule.  Specimen was sent to pathology and frozen section showed this to be a lymphoid tissue tumor.  Lymphoma markers will be performed.  The margins were cleared by the  pathologist.  While the Pathology was looking at the specimen, lymph nodes were biopsied from the 10R and 11R stations and submitted for permanent section.  The staple lines were inspected and found to be hemostatic. Pulmonary is sealant (Pro Gel) was placed on the staple lines.  After the pathology report was returned, the lungs were expanded under direct vision.  A 28-French chest tube was placed and directed to the apex and connected to underwater seal Pleur-Evac drainage system.  The chest was closed with #2 Vicryl pericostals to reapproximate the ribs.  The muscle layers were closed in 2 layers using #1 interrupted Ethibond.  The subcutaneous and skin layers were closed with a running Vicryl.  An On Q catheter was placed just beneath the incision above the chest tube site and connected to a reservoir of 0.5% Marcaine and secured to the skin with silk suture.  Sterile dressings were applied.  The patient was then returned to the supine position.  He was extubated and observed and found to be stable for transfer to the recovery room.     Ivin Poot, M.D.     PV/MEDQ  D:  06/24/2011  T:  06/25/2011  Job:  [id number]  Electronically Signed by Ivin Poot M.D. on 07/10/2011 08:07:39 AM

## 2011-07-10 NOTE — Discharge Summary (Signed)
NAMEARLAN, Curtis Mays NO.:  [id number]  MEDICAL RECORD NO.:  [id number]  LOCATION:  2003                         FACILITY:  [facility]  PHYSICIAN:  Ivin Poot, M.D.  DATE OF BIRTH:  1960/10/02  DATE OF ADMISSION:  06/24/2011 DATE OF DISCHARGE:                              DISCHARGE SUMMARY   PRIMARY ADMITTING DIAGNOSIS:  Right lower lobe lung mass.  ADDITIONAL/DISCHARGE DIAGNOSES: 1. Mucosa-associated lymphoid tissue (MALT) lymphoma. 2. Type 2 diabetes mellitus, insulin dependent. 3. History of neuropathy. 4. Chronic pain syndrome. 5. Arthritis. 6. Obesity. 7. Hyperlipidemia. 8. Restless legs syndrome. 9. Hypertension. 10.History of right thyroid nodule. 11.Aortic calcification. 12.Previous history of tobacco abuse.  PROCEDURES PERFORMED:  Right VATS, right mini thoracotomy with right lower lobe wedge resection and node dissection.  HISTORY:  The patient is a 50 year old male who underwent cervical spine surgery earlier this year at [facility].  During his preoperative workup, he underwent a CT scan, which showed an incidental 1.7 cm nodule in the superior segment of the right lower lobe.  A PET scan was then performed, which showed no metabolic activity in the nodule or in any of the mediastinal lymph nodes.  A CT of the head was negative.  The patient was referred to Dr. Tharon Aquas Trigt for further evaluation and treatment.  Dr. Prescott Gum reviewed his films and felt that this likely represented an early stage lung cancer.  The patient was given the option of either following this with serial CT scans or excisional biopsy.  It was his preference that the abnormality be removed and after explanation of all risks, benefits, and alternatives of surgery, the patient agreed to proceed.  HOSPITAL COURSE:  He was admitted to [facility] on June 24, 2011 and was taken to the operating room where he underwent right VATS with  right lower lobe wedge resection by Dr. Prescott Gum.  Please see previously dictated operative report for complete details of surgery. He tolerated the procedure well and was transferred to the SICU in stable condition for further postoperative convalescence.  The intraoperative frozen section was positive for lymphoma, and his final pathology showed extranodal marginal zone lymphoma of mucosa-associated lymphoid tissue lymphoma (MALT).  Postoperatively, he has done very well.  He has been transferred to the step-down unit and is progressing well.  His chest tubes have been removed in the standard fashion and a followup chest x-ray is stable with only a tiny residual right apical pneumothorax.  He is tolerating a regular diet and is having normal bowel and bladder function.  He has remained afebrile, and his vital signs have been stable.  His most recent postoperative labs show sodium 137, potassium 4.4, BUN 44, creatinine 1.38, hemoglobin 12.1, hematocrit 34.9, platelets 180, white count 13.5.  His incisions are all healing well.  He has been seen and evaluated on the morning of postop day 4 and is doing well and has been deemed ready for discharge home at this time.  DISCHARGE MEDICATIONS:  As follows: 1. Oxycodone/APAP 5/325 mg 1-2 q.4 h. p.r.n. pain. 2. Aspirin 325 mg daily. 3. Bystolic 10 mg daily. 4. Lasix 80 mg  daily. 5. Neurontin 600 mg t.i.d. 6. Lantus 80 units subcu at bedtime. 7. Lisinopril 40 mg daily. 8. Metformin 1000 mg b.i.d. 9. NovoLog 10 units subcutaneously t.i.d. with meals as directed. 10.Flomax 0.4 mg at bedtime. 11.Uloric 80 mg at bedtime.  DISCHARGE INSTRUCTIONS:  He is asked to refrain from driving, heavy lifting, or strenuous activity.  He may continue ambulating daily and using his incentive spirometer.  He may shower daily and clean his incisions with soap and water.  He will continue his same preoperative diet.  DISCHARGE FOLLOWUP:  He will return to  the office on July 10, 2011 to see Dr. Prescott Gum with a chest x-ray from [place] 45 minutes prior to this appointment.  If he experiences any problems or has questions in the interim, he is asked to contact our office immediately. Dr. Prescott Gum will also schedule an appointment with the patient to follow up with Dr. Beryle Beams in the [facility] for further management of this issue post discharge.  Primary care physician there is Jenean Lindau.     Suzzanne Cloud, P.A.   ______________________________ Ivin Poot, M.D.    GC/MEDQ  D:  06/28/2011  T:  06/28/2011  Job:  [id number]  cc:   Annia Belt, M.D., F.A.C.P. [place] [facility]  Electronically Signed by Micheline Maze. on 07/02/2011 10:12:24 AM Electronically Signed by Ivin Poot M.D. on 07/10/2011 08:07:34 AM

## 2011-07-10 NOTE — Progress Notes (Signed)
:                    [address]            [place] [address]          [phone]     HPI The patient returns for his first postop office visit after undergoing right VATS and wedge resection of a B cell lymphoma (MALT) 2 weeks ago. He did well following surgery without significant Pulmicort problem was and remained in a sinus rhythm. He is currently on his long-term medications as listed below for his diabetes, neuropathy, and hypertension. He is having some postthoracotomy pain which is helped with the oral narcotic. He denies fever productive cough or difficulty with surgical drainage or erythema. He is a nonsmoker.  The patient has an appointment to be evaluated by Dr. Cletis Athens later this month. The pathology report has been forwarded to Dr. Sammuel Cooper.  Current Outpatient Prescriptions  Medication Sig Dispense Refill  . aspirin 81 MG tablet Take 81 mg by mouth daily.        . febuxostat (ULORIC) 40 MG tablet Take 80 mg by mouth daily.        . furosemide (LASIX) 80 MG tablet Take 80 mg by mouth daily.        Marland Kitchen gabapentin (NEURONTIN) 600 MG tablet Take 600 mg by mouth 3 (three) times daily.        Marland Kitchen HYDROcodone-acetaminophen (LORTAB) 7.5-500 MG per tablet Take 1 tablet by mouth every 4 (four) hours as needed. One or two every 4-6 hours prn pain  40 tablet  0  . insulin aspart (NOVOLOG) 100 UNIT/ML injection Inject into the skin 3 (three) times daily before meals.        . insulin glargine (LANTUS) 100 UNIT/ML injection Inject into the skin at bedtime. 54[place]is am and 80units pm       . lisinopril (PRINIVIL,ZESTRIL) 40 MG tablet Take 40 mg by mouth daily.        . metFORMIN (GLUCOPHAGE) 1000 MG tablet Take 1,000 mg by mouth 2 (two) times daily with a meal.        . nebivolol (BYSTOLIC) 10 MG tablet Take 10 mg by mouth daily.        Marland Kitchen oxyCODONE-acetaminophen (PERCOCET) 5-325 MG per tablet 1 tablet every 6 (six) hours as needed.       . Tamsulosin HCl (FLOMAX)  0.4 MG CAPS Take 0.4 mg by mouth daily.           Physical Exam: Is afebrile. Oxygen saturation 96% room air. Blood pressure 105/60. Pulse 80 and regular. Breath sounds clear and equal. Thoracotomy incision well-healed. No calf tenderness.  Diagnostic Tests: Chest x-ray reveals minimal volume loss of the right base, no pleural effusion no airspace disease.  Impression Stable course following right VATS and wedge resection of the 1.7 cm nodule which by histology is a MALT tumor.  Plan: Return in 2 months with a chest x-ray for followup of recovery. The patient was provided with a prescription for oxycodone when necessary pain.

## 2011-07-10 NOTE — Patient Instructions (Signed)
Ok to drive,routine activity

## 2011-07-28 ENCOUNTER — Encounter: Payer: Self-pay | Admitting: Oncology

## 2011-07-29 ENCOUNTER — Ambulatory Visit (HOSPITAL_BASED_OUTPATIENT_CLINIC_OR_DEPARTMENT_OTHER): Payer: Medicare Other | Admitting: Oncology

## 2011-07-29 ENCOUNTER — Other Ambulatory Visit: Payer: Self-pay | Admitting: *Deleted

## 2011-07-29 ENCOUNTER — Telehealth: Payer: Self-pay | Admitting: Oncology

## 2011-07-29 ENCOUNTER — Ambulatory Visit: Payer: Medicare Other

## 2011-07-29 ENCOUNTER — Other Ambulatory Visit: Payer: Medicare Other | Admitting: Lab

## 2011-07-29 VITALS — BP 180/87 | HR 66 | Temp 98.1°F | Ht 69.5 in | Wt 251.6 lb

## 2011-07-29 DIAGNOSIS — C884 Extranodal marginal zone B-cell lymphoma of mucosa-associated lymphoid tissue [MALT-lymphoma]: Secondary | ICD-10-CM

## 2011-07-29 DIAGNOSIS — G8929 Other chronic pain: Secondary | ICD-10-CM

## 2011-07-29 DIAGNOSIS — I10 Essential (primary) hypertension: Secondary | ICD-10-CM

## 2011-07-29 DIAGNOSIS — C8582 Other specified types of non-Hodgkin lymphoma, intrathoracic lymph nodes: Secondary | ICD-10-CM

## 2011-07-29 DIAGNOSIS — E119 Type 2 diabetes mellitus without complications: Secondary | ICD-10-CM

## 2011-07-29 NOTE — Telephone Encounter (Signed)
gve the pt his nov ct scan appt along with the march 2013 appt calendar and ct scan appts with instructions.
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
## 2011-08-02 ENCOUNTER — Other Ambulatory Visit: Payer: Self-pay | Admitting: Oncology

## 2011-08-02 ENCOUNTER — Ambulatory Visit (HOSPITAL_COMMUNITY)
Admission: RE | Admit: 2011-08-02 | Discharge: 2011-08-02 | Disposition: A | Discharge status: Home / Self Care | Payer: Medicare Other | Source: Ambulatory Visit | Attending: Oncology | Admitting: Oncology

## 2011-08-02 DIAGNOSIS — C8589 Other specified types of non-Hodgkin lymphoma, extranodal and solid organ sites: Secondary | ICD-10-CM | POA: Insufficient documentation

## 2011-08-02 DIAGNOSIS — Z902 Acquired absence of lung [part of]: Secondary | ICD-10-CM | POA: Insufficient documentation

## 2011-08-02 DIAGNOSIS — J90 Pleural effusion, not elsewhere classified: Secondary | ICD-10-CM | POA: Insufficient documentation

## 2011-08-02 DIAGNOSIS — C884 Extranodal marginal zone b-cell lymphoma of mucosa-associated lymphoid tissue (malt-lymphoma) not having achieved remission: Secondary | ICD-10-CM

## 2011-08-02 DIAGNOSIS — I2510 Atherosclerotic heart disease of native coronary artery without angina pectoris: Secondary | ICD-10-CM | POA: Insufficient documentation

## 2011-08-02 DIAGNOSIS — R599 Enlarged lymph nodes, unspecified: Secondary | ICD-10-CM | POA: Insufficient documentation

## 2011-08-02 DIAGNOSIS — K7689 Other specified diseases of liver: Secondary | ICD-10-CM | POA: Insufficient documentation

## 2011-08-02 DIAGNOSIS — R161 Splenomegaly, not elsewhere classified: Secondary | ICD-10-CM | POA: Insufficient documentation

## 2011-08-02 MED ORDER — IOHEXOL 300 MG/ML  SOLN
100.0000 mL | Freq: Once | INTRAMUSCULAR | Status: AC | PRN
  Administered 2011-08-02: 100 mL via INTRAVENOUS

## 2011-08-11 ENCOUNTER — Inpatient Hospital Stay (HOSPITAL_COMMUNITY)
Admission: EM | Admit: 2011-08-11 | Discharge: 2011-08-16 | DRG: 194 | Disposition: A | Discharge status: Home / Self Care | Payer: Medicare Other | Attending: Internal Medicine | Admitting: Internal Medicine

## 2011-08-11 ENCOUNTER — Emergency Department (HOSPITAL_COMMUNITY): Payer: Medicare Other

## 2011-08-11 ENCOUNTER — Encounter (HOSPITAL_COMMUNITY): Payer: Self-pay | Admitting: Emergency Medicine

## 2011-08-11 ENCOUNTER — Other Ambulatory Visit: Payer: Self-pay

## 2011-08-11 DIAGNOSIS — E871 Hypo-osmolality and hyponatremia: Secondary | ICD-10-CM | POA: Diagnosis present

## 2011-08-11 DIAGNOSIS — D72829 Elevated white blood cell count, unspecified: Secondary | ICD-10-CM | POA: Diagnosis present

## 2011-08-11 DIAGNOSIS — R0600 Dyspnea, unspecified: Secondary | ICD-10-CM

## 2011-08-11 DIAGNOSIS — C884 Extranodal marginal zone B-cell lymphoma of mucosa-associated lymphoid tissue [MALT-lymphoma]: Secondary | ICD-10-CM | POA: Diagnosis present

## 2011-08-11 DIAGNOSIS — I10 Essential (primary) hypertension: Secondary | ICD-10-CM | POA: Diagnosis present

## 2011-08-11 DIAGNOSIS — R52 Pain, unspecified: Secondary | ICD-10-CM

## 2011-08-11 DIAGNOSIS — C8589 Other specified types of non-Hodgkin lymphoma, extranodal and solid organ sites: Secondary | ICD-10-CM | POA: Diagnosis present

## 2011-08-11 DIAGNOSIS — E875 Hyperkalemia: Secondary | ICD-10-CM | POA: Diagnosis present

## 2011-08-11 DIAGNOSIS — J189 Pneumonia, unspecified organism: Principal | ICD-10-CM | POA: Diagnosis present

## 2011-08-11 DIAGNOSIS — E119 Type 2 diabetes mellitus without complications: Secondary | ICD-10-CM | POA: Diagnosis present

## 2011-08-11 LAB — DIFFERENTIAL
Basophils Absolute: 0 10*3/uL (ref 0.0–0.1)
Eosinophils Relative: 0 % (ref 0–5)
Lymphocytes Relative: 3 % — ABNORMAL LOW (ref 12–46)
Monocytes Absolute: 1.5 10*3/uL — ABNORMAL HIGH (ref 0.1–1.0)

## 2011-08-11 LAB — BASIC METABOLIC PANEL
CO2: 25 mEq/L (ref 19–32)
Calcium: 10.2 mg/dL (ref 8.4–10.5)
Creatinine, Ser: 1.02 mg/dL (ref 0.50–1.35)
Glucose, Bld: 272 mg/dL — ABNORMAL HIGH (ref 70–99)

## 2011-08-11 LAB — GLUCOSE, CAPILLARY: Glucose-Capillary: 333 mg/dL — ABNORMAL HIGH (ref 70–99)

## 2011-08-11 LAB — CBC
HCT: 37.1 % — ABNORMAL LOW (ref 39.0–52.0)
MCH: 31.1 pg (ref 26.0–34.0)
MCV: 84.9 fL (ref 78.0–100.0)
RDW: 13.2 % (ref 11.5–15.5)
WBC: 21 10*3/uL — ABNORMAL HIGH (ref 4.0–10.5)

## 2011-08-11 LAB — URINALYSIS, ROUTINE W REFLEX MICROSCOPIC
Ketones, ur: NEGATIVE mg/dL
Specific Gravity, Urine: 1.031 — ABNORMAL HIGH (ref 1.005–1.030)
Urobilinogen, UA: 1 mg/dL (ref 0.0–1.0)
pH: 5 (ref 5.0–8.0)

## 2011-08-11 LAB — CARDIAC PANEL(CRET KIN+CKTOT+MB+TROPI)
Relative Index: INVALID (ref 0.0–2.5)
Total CK: 27 U/L (ref 7–232)

## 2011-08-11 LAB — PRO B NATRIURETIC PEPTIDE: Pro B Natriuretic peptide (BNP): 253 pg/mL — ABNORMAL HIGH (ref 0–125)

## 2011-08-11 MED ORDER — DEXTROSE 5 % IV SOLN
1.0000 g | INTRAVENOUS | Status: DC
  Administered 2011-08-11 – 2011-08-15 (×5): 1 g via INTRAVENOUS
  Filled 2011-08-11 (×6): qty 10

## 2011-08-11 MED ORDER — GABAPENTIN 300 MG PO CAPS
600.0000 mg | ORAL_CAPSULE | Freq: Three times a day (TID) | ORAL | Status: DC
  Administered 2011-08-11 – 2011-08-16 (×14): 600 mg via ORAL
  Filled 2011-08-11 (×16): qty 2

## 2011-08-11 MED ORDER — IPRATROPIUM BROMIDE 0.02 % IN SOLN
0.5000 mg | Freq: Four times a day (QID) | RESPIRATORY_TRACT | Status: DC
  Administered 2011-08-11 – 2011-08-13 (×8): 0.5 mg via RESPIRATORY_TRACT
  Filled 2011-08-11 (×9): qty 2.5

## 2011-08-11 MED ORDER — IPRATROPIUM BROMIDE 0.02 % IN SOLN
0.5000 mg | Freq: Once | RESPIRATORY_TRACT | Status: AC
  Administered 2011-08-11: 0.5 mg via RESPIRATORY_TRACT
  Filled 2011-08-11: qty 2.5

## 2011-08-11 MED ORDER — INSULIN GLARGINE 100 UNIT/ML ~~LOC~~ SOLN
20.0000 [IU] | Freq: Every day | SUBCUTANEOUS | Status: DC
  Administered 2011-08-11: 20 [IU] via SUBCUTANEOUS
  Filled 2011-08-11: qty 3

## 2011-08-11 MED ORDER — INSULIN ASPART 100 UNIT/ML ~~LOC~~ SOLN
0.0000 [IU] | Freq: Three times a day (TID) | SUBCUTANEOUS | Status: DC
  Administered 2011-08-12: 8 [IU] via SUBCUTANEOUS
  Administered 2011-08-12 (×2): 5 [IU] via SUBCUTANEOUS
  Administered 2011-08-13: 11 [IU] via SUBCUTANEOUS
  Administered 2011-08-13 – 2011-08-15 (×7): 5 [IU] via SUBCUTANEOUS
  Administered 2011-08-15: 11 [IU] via SUBCUTANEOUS
  Administered 2011-08-16: 5 [IU] via SUBCUTANEOUS

## 2011-08-11 MED ORDER — GABAPENTIN 600 MG PO TABS: 600.0000 mg | ORAL_TABLET | Freq: Three times a day (TID) | ORAL | Status: DC

## 2011-08-11 MED ORDER — IBUPROFEN 800 MG PO TABS
400.0000 mg | ORAL_TABLET | Freq: Four times a day (QID) | ORAL | Status: DC | PRN
  Filled 2011-08-11: qty 1

## 2011-08-11 MED ORDER — TAMSULOSIN HCL 0.4 MG PO CAPS
0.4000 mg | ORAL_CAPSULE | Freq: Every day | ORAL | Status: DC
  Administered 2011-08-12 – 2011-08-15 (×4): 0.4 mg via ORAL
  Filled 2011-08-11 (×5): qty 1

## 2011-08-11 MED ORDER — DEXTROSE 5 % IV SOLN
500.0000 mg | INTRAVENOUS | Status: DC
  Administered 2011-08-11 – 2011-08-15 (×5): 500 mg via INTRAVENOUS
  Filled 2011-08-11 (×6): qty 500

## 2011-08-11 MED ORDER — INSULIN ASPART 100 UNIT/ML ~~LOC~~ SOLN
3.0000 [IU] | Freq: Three times a day (TID) | SUBCUTANEOUS | Status: DC
  Administered 2011-08-12 – 2011-08-13 (×5): 3 [IU] via SUBCUTANEOUS

## 2011-08-11 MED ORDER — INSULIN ASPART 100 UNIT/ML ~~LOC~~ SOLN
4.0000 [IU] | Freq: Once | SUBCUTANEOUS | Status: AC
  Administered 2011-08-11: 4 [IU] via SUBCUTANEOUS
  Filled 2011-08-11: qty 3

## 2011-08-11 MED ORDER — ONDANSETRON HCL 4 MG/2ML IJ SOLN
4.0000 mg | Freq: Four times a day (QID) | INTRAMUSCULAR | Status: DC | PRN
  Administered 2011-08-13: 4 mg via INTRAVENOUS
  Filled 2011-08-11: qty 2

## 2011-08-11 MED ORDER — DOCUSATE SODIUM 100 MG PO CAPS
100.0000 mg | ORAL_CAPSULE | Freq: Two times a day (BID) | ORAL | Status: DC
  Administered 2011-08-11 – 2011-08-16 (×10): 100 mg via ORAL
  Filled 2011-08-11 (×11): qty 1

## 2011-08-11 MED ORDER — NEBIVOLOL HCL 10 MG PO TABS
10.0000 mg | ORAL_TABLET | Freq: Every day | ORAL | Status: DC
  Administered 2011-08-12 – 2011-08-16 (×5): 10 mg via ORAL
  Filled 2011-08-11 (×6): qty 1

## 2011-08-11 MED ORDER — LEVALBUTEROL HCL 0.63 MG/3ML IN NEBU
0.6300 mg | INHALATION_SOLUTION | Freq: Once | RESPIRATORY_TRACT | Status: AC
  Administered 2011-08-11: 0.63 mg via RESPIRATORY_TRACT
  Filled 2011-08-11: qty 3

## 2011-08-11 MED ORDER — GUAIFENESIN ER 600 MG PO TB12
600.0000 mg | ORAL_TABLET | Freq: Two times a day (BID) | ORAL | Status: DC
  Administered 2011-08-11 – 2011-08-16 (×10): 600 mg via ORAL
  Filled 2011-08-11 (×12): qty 1

## 2011-08-11 MED ORDER — LEVALBUTEROL HCL 0.63 MG/3ML IN NEBU
0.6300 mg | INHALATION_SOLUTION | Freq: Four times a day (QID) | RESPIRATORY_TRACT | Status: DC
  Administered 2011-08-12 – 2011-08-13 (×7): 0.63 mg via RESPIRATORY_TRACT
  Filled 2011-08-11 (×16): qty 3

## 2011-08-11 MED ORDER — FEBUXOSTAT 40 MG PO TABS
80.0000 mg | ORAL_TABLET | Freq: Every day | ORAL | Status: DC
  Administered 2011-08-12 – 2011-08-15 (×4): 80 mg via ORAL
  Filled 2011-08-11 (×5): qty 2

## 2011-08-11 MED ORDER — ALBUTEROL SULFATE (5 MG/ML) 0.5% IN NEBU
2.5000 mg | INHALATION_SOLUTION | Freq: Once | RESPIRATORY_TRACT | Status: AC
  Administered 2011-08-11: 2.5 mg via RESPIRATORY_TRACT
  Filled 2011-08-11: qty 0.5

## 2011-08-11 MED ORDER — ACETAMINOPHEN 650 MG RE SUPP: 650.0000 mg | Freq: Four times a day (QID) | RECTAL | Status: DC | PRN

## 2011-08-11 MED ORDER — ACETAMINOPHEN 325 MG PO TABS: 650.0000 mg | ORAL_TABLET | Freq: Four times a day (QID) | ORAL | Status: DC | PRN

## 2011-08-11 MED ORDER — MORPHINE SULFATE 2 MG/ML IJ SOLN
1.0000 mg | INTRAMUSCULAR | Status: DC | PRN
  Administered 2011-08-11 – 2011-08-15 (×9): 1 mg via INTRAVENOUS
  Filled 2011-08-11 (×9): qty 1

## 2011-08-11 MED ORDER — ASPIRIN 81 MG PO CHEW
81.0000 mg | CHEWABLE_TABLET | Freq: Every day | ORAL | Status: DC
  Administered 2011-08-12 – 2011-08-16 (×5): 81 mg via ORAL
  Filled 2011-08-11 (×5): qty 1

## 2011-08-11 MED ORDER — ONDANSETRON HCL 4 MG PO TABS: 4.0000 mg | ORAL_TABLET | Freq: Four times a day (QID) | ORAL | Status: DC | PRN

## 2011-08-11 MED ORDER — OSELTAMIVIR PHOSPHATE 75 MG PO CAPS
75.0000 mg | ORAL_CAPSULE | Freq: Two times a day (BID) | ORAL | Status: DC
  Administered 2011-08-12 – 2011-08-16 (×9): 75 mg via ORAL
  Filled 2011-08-11 (×11): qty 1

## 2011-08-11 MED ORDER — SODIUM CHLORIDE 0.9 % IV SOLN
Freq: Once | INTRAVENOUS | Status: AC
  Administered 2011-08-11: 15:00:00 via INTRAVENOUS

## 2011-08-11 MED ORDER — OXYCODONE-ACETAMINOPHEN 5-325 MG PO TABS
1.0000 | ORAL_TABLET | Freq: Four times a day (QID) | ORAL | Status: DC | PRN
  Administered 2011-08-11 – 2011-08-16 (×17): 1 via ORAL
  Filled 2011-08-11 (×16): qty 1

## 2011-08-11 MED ORDER — LISINOPRIL 20 MG PO TABS: 40.0000 mg | ORAL_TABLET | Freq: Every day | ORAL | Status: DC

## 2011-08-11 MED ORDER — ALBUTEROL SULFATE (5 MG/ML) 0.5% IN NEBU: 2.5000 mg | INHALATION_SOLUTION | RESPIRATORY_TRACT | Status: DC | PRN

## 2011-08-11 MED ORDER — SODIUM CHLORIDE 0.9 % IV SOLN
INTRAVENOUS | Status: DC
  Administered 2011-08-12 (×2): via INTRAVENOUS
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
## 2011-08-11 NOTE — ED Notes (Signed)
[id number] Expected date:08/11/11<BR> Expected time:<BR> Means of arrival:Ambulance<BR> Comments:<BR> RAND [id number] - 51yoM Lung CA productive cough

## 2011-08-11 NOTE — ED Notes (Signed)
resp called for tx 

## 2011-08-11 NOTE — ED Notes (Signed)
Per EMS pt c/o sob with productive cough, green sputum. Pt states fever and chills as well as pain with cough. No resp distress.
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
## 2011-08-12 ENCOUNTER — Inpatient Hospital Stay (HOSPITAL_COMMUNITY): Payer: Medicare Other

## 2011-08-12 LAB — COMPREHENSIVE METABOLIC PANEL
ALT: 13 U/L (ref 0–53)
AST: 9 U/L (ref 0–37)
Albumin: 2.8 g/dL — ABNORMAL LOW (ref 3.5–5.2)
Calcium: 9.4 mg/dL (ref 8.4–10.5)
GFR calc Af Amer: 66 mL/min — ABNORMAL LOW (ref >90)
Potassium: 4.3 mEq/L (ref 3.5–5.1)
Sodium: 130 mEq/L — ABNORMAL LOW (ref 135–145)
Total Protein: 7.3 g/dL (ref 6.0–8.3)

## 2011-08-12 LAB — CARDIAC PANEL(CRET KIN+CKTOT+MB+TROPI)
Relative Index: INVALID (ref 0.0–2.5)
Relative Index: INVALID (ref 0.0–2.5)
Troponin I: <0.3 ng/mL (ref <0.30)

## 2011-08-12 LAB — CBC
HCT: 32.8 % — ABNORMAL LOW (ref 39.0–52.0)
Hemoglobin: 11.7 g/dL — ABNORMAL LOW (ref 13.0–17.0)
MCH: 30.5 pg (ref 26.0–34.0)
MCHC: 35.7 g/dL (ref 30.0–36.0)
MCV: 85.4 fL (ref 78.0–100.0)

## 2011-08-12 LAB — GLUCOSE, CAPILLARY: Glucose-Capillary: 207 mg/dL — ABNORMAL HIGH (ref 70–99)

## 2011-08-12 LAB — APTT: aPTT: 38 seconds — ABNORMAL HIGH (ref 24–37)

## 2011-08-12 LAB — EXPECTORATED SPUTUM ASSESSMENT W GRAM STAIN, RFLX TO RESP C

## 2011-08-12 LAB — PROTIME-INR: Prothrombin Time: 16.5 seconds — ABNORMAL HIGH (ref 11.6–15.2)

## 2011-08-12 LAB — INFLUENZA PANEL BY PCR (TYPE A & B): Influenza A By PCR: NEGATIVE

## 2011-08-12 MED ORDER — INSULIN GLARGINE 100 UNIT/ML ~~LOC~~ SOLN
30.0000 [IU] | Freq: Every day | SUBCUTANEOUS | Status: DC
  Administered 2011-08-12: 30 [IU] via SUBCUTANEOUS

## 2011-08-12 NOTE — Progress Notes (Signed)
Subjective: Patient seen, admitted with pneumonia, on ceftriaxone and zithromax.  Objective: Vital signs in last 24 hours: Temp:  [97.2 F (36.2 C)-100.3 F (37.9 C)] 100 F (37.8 C) (12/10 1345) Pulse Rate:  [79-107] 80  (12/10 1345) Resp:  [18-19] 19  (12/10 1345) BP: (114-144)/(62-77) 126/64 mmHg (12/10 1345) SpO2:  [96 %-100 %] 97 % (12/10 1433) Weight:  [109.7 kg (241 lb 13.5 oz)] 241 lb 13.5 oz (109.7 kg) (12/09 2123) Weight change:  Last BM Date: 08/05/11  Intake/Output from previous day: 12/09 0701 - 12/10 0700 In: 975 [P.O.:360; I.V.:315; IV Piggyback:300] Out: 150 [Urine:150] Total I/O In: 1330 [P.O.:480; I.V.:600; IV Piggyback:250] Out: 800 [Urine:800]   Physical Exam: General: Alert, awake, oriented x3, in no acute distress. HEENT: No bruits, no goiter. Heart: Regular rate and rhythm, without murmurs, rubs, gallops. Lungs: Clear to auscultation bilaterally. Abdomen: Soft, nontender, nondistended, positive bowel sounds. Extremities: No clubbing cyanosis or edema with positive pedal pulses. Neuro: Grossly intact, nonfocal.    Lab Results: Basic Metabolic Panel:  Basename 08/12/11 0200 08/11/11 1528  NA 130* 132*  K 4.3 5.9*  CL 95* 95*  CO2 25 25  GLUCOSE 289* 272*  BUN 40* 32*  CREATININE 1.39* 1.02  CALCIUM 9.4 10.2  MG -- --  PHOS -- --   Liver Function Tests:  Basename 08/12/11 0200  AST 9  ALT 13  ALKPHOS 68  BILITOT 1.4*  PROT 7.3  ALBUMIN 2.8*   CBC:  Basename 08/12/11 0200 08/11/11 1528  WBC 24.1* 21.0*  NEUTROABS -- 18.8*  HGB 11.7* 13.6  HCT 32.8* 37.1*  MCV 85.4 84.9  PLT 242 272   Cardiac Enzymes:  Basename 08/12/11 1055 08/12/11 0200 08/11/11 1808  CKTOTAL 30 30 27   CKMB 1.8 1.6 1.5  CKMBINDEX -- -- --  TROPONINI <0.30 <0.30 <0.30     Basename 08/12/11 1654 08/12/11 1154 08/12/11 0825 08/11/11 2130  GLUCAP 207* 290* 233* 333*    Coagulation:  Basename 08/12/11 0200  LABPROT 16.5*  INR 1.31   Urine Drug  Screen: Drugs of Abuse  No results found for this basename: labopia, cocainscrnur, labbenz, amphetmu, thcu, labbarb     Recent Results (from the past 240 hour(s))  CULTURE, SPUTUM-ASSESSMENT     Status: Normal   Collection Time   08/12/11 10:30 AM      Component Value Range Status Comment   Specimen Description SPUTUM   Final    Special Requests NONE   Final    Sputum evaluation     Final    Value: THIS SPECIMEN IS ACCEPTABLE. RESPIRATORY CULTURE REPORT TO FOLLOW.   Report Status 08/12/2011 FINAL   Final     Studies/Results: Dg Chest 2 View  08/12/2011  *RADIOLOGY REPORT*  Clinical Data: Cough, fever  CHEST - 2 VIEW  Comparison: 08/10/2009  Findings: Cardiomediastinal silhouette is stable.  There is increasing right pleural effusion with right basilar atelectasis or infiltrate.  Stable postsurgical changes. No pulmonary edema.  IMPRESSION: Increasing right pleural effusion with right basilar atelectasis or infiltrate.  No pulmonary edema.  Original Report Authenticated By: Lahoma Crocker, M.D.   Dg Chest 2 View  08/11/2011  *RADIOLOGY REPORT*  Clinical Data: Fever and cough.  History of right lobectomy. History of right lobectomy and lymphoma.  CHEST - 2 VIEW  Comparison: CT chest 08/02/2011 and plain film chest 07/10/2011.  Findings: Small right pleural effusion with some basilar atelectasis appear unchanged.  Left lung is clear.  Heart size normal.  No pneumothorax.  No focal bony abnormality.  IMPRESSION: No change in a small right effusion and basilar airspace disease consistent with history of prior right lower lobectomy.  Original Report Authenticated By: Arvid Right. Luther Parody, M.D.    Medications: Scheduled Meds:   . aspirin  81 mg Oral Daily  . azithromycin  500 mg Intravenous Q24H  . cefTRIAXone (ROCEPHIN)  IV  1 g Intravenous Q24H  . docusate sodium  100 mg Oral BID  . febuxostat  80 mg Oral Daily  . gabapentin  600 mg Oral TID  . guaiFENesin  600 mg Oral BID  . insulin aspart   0-15 Units Subcutaneous TID WC  . insulin aspart  3 Units Subcutaneous TID WC  . insulin aspart  4 Units Subcutaneous Once  . insulin glargine  30 Units Subcutaneous QHS  . ipratropium  0.5 mg Nebulization Q6H  . levalbuterol  0.63 mg Nebulization Q6H  . levalbuterol  0.63 mg Nebulization Once  . nebivolol  10 mg Oral Daily  . oseltamivir  75 mg Oral BID  . Tamsulosin HCl  0.4 mg Oral Daily  . DISCONTD: gabapentin  600 mg Oral TID  . DISCONTD: insulin glargine  20 Units Subcutaneous QHS  . DISCONTD: lisinopril  40 mg Oral Daily   Continuous Infusions:   . sodium chloride 75 mL/hr at 08/12/11 1500   PRN Meds:.acetaminophen, acetaminophen, albuterol, ibuprofen, morphine, ondansetron (ZOFRAN) IV, ondansetron, oxyCODONE-acetaminophen  Assessment/Plan:    PNA (pneumonia) Continue Ceftriaxone and zithromax. Will get CBC in am.   DM2 (diabetes mellitus, type 2) Continue lantus and SSI  HTN (hypertension) Continue nebivolol.  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  Leukocytosis Sec to pneumonia.    LOS: 1 day   [facility] Pager: [phone] 08/12/2011, 5:37 PM

## 2011-08-12 NOTE — Progress Notes (Signed)
UR CHART REVIEWED; B Konstantina Nachreiner RN, BSN, MHA 

## 2011-08-13 ENCOUNTER — Encounter: Payer: Self-pay | Admitting: *Deleted

## 2011-08-13 LAB — CBC
Hemoglobin: 11 g/dL — ABNORMAL LOW (ref 13.0–17.0)
MCH: 29.6 pg (ref 26.0–34.0)
MCHC: 34.3 g/dL (ref 30.0–36.0)
Platelets: 255 10*3/uL (ref 150–400)

## 2011-08-13 LAB — BASIC METABOLIC PANEL
Calcium: 9.5 mg/dL (ref 8.4–10.5)
GFR calc non Af Amer: 79 mL/min — ABNORMAL LOW (ref >90)
Glucose, Bld: 210 mg/dL — ABNORMAL HIGH (ref 70–99)
Sodium: 132 mEq/L — ABNORMAL LOW (ref 135–145)

## 2011-08-13 LAB — URINE CULTURE
Culture  Setup Time: 201212100303
Culture: NO GROWTH

## 2011-08-13 LAB — GLUCOSE, CAPILLARY
Glucose-Capillary: 212 mg/dL — ABNORMAL HIGH (ref 70–99)
Glucose-Capillary: 263 mg/dL — ABNORMAL HIGH (ref 70–99)

## 2011-08-13 MED ORDER — INSULIN GLARGINE 100 UNIT/ML ~~LOC~~ SOLN
50.0000 [IU] | Freq: Every day | SUBCUTANEOUS | Status: DC
  Administered 2011-08-13 – 2011-08-14 (×2): 50 [IU] via SUBCUTANEOUS

## 2011-08-13 MED ORDER — INSULIN ASPART 100 UNIT/ML ~~LOC~~ SOLN
4.0000 [IU] | Freq: Three times a day (TID) | SUBCUTANEOUS | Status: DC
  Administered 2011-08-13 – 2011-08-16 (×8): 4 [IU] via SUBCUTANEOUS

## 2011-08-13 MED ORDER — IPRATROPIUM BROMIDE 0.02 % IN SOLN
0.5000 mg | Freq: Four times a day (QID) | RESPIRATORY_TRACT | Status: DC
  Administered 2011-08-14 – 2011-08-16 (×7): 0.5 mg via RESPIRATORY_TRACT
  Filled 2011-08-13 (×7): qty 2.5

## 2011-08-13 MED ORDER — LEVALBUTEROL HCL 0.63 MG/3ML IN NEBU
0.6300 mg | INHALATION_SOLUTION | Freq: Four times a day (QID) | RESPIRATORY_TRACT | Status: DC
  Administered 2011-08-14 – 2011-08-16 (×7): 0.63 mg via RESPIRATORY_TRACT
  Filled 2011-08-13 (×13): qty 3

## 2011-08-13 MED ORDER — VITAMINS A & D EX OINT
TOPICAL_OINTMENT | CUTANEOUS | Status: AC
  Administered 2011-08-13: 5
  Filled 2011-08-13: qty 5

## 2011-08-13 NOTE — Progress Notes (Signed)
Subjective: Patient seen, admitted with pneumonia, on ceftriaxone and zithromax. WBC has improved, complains of cough with clear phlegm. Influenza PCR is negative. Blood Glucose is elevated, lantus was increased to 30 units yesterday.  Objective: Vital signs in last 24 hours: Temp:  [98.1 F (36.7 C)-99.1 F (37.3 C)] 98.1 F (36.7 C) (12/11 1345) Pulse Rate:  [76-101] 76  (12/11 1345) Resp:  [19-20] 19  (12/11 1345) BP: (130-164)/(69-79) 130/69 mmHg (12/11 1345) SpO2:  [97 %-100 %] 98 % (12/11 1345) Weight:  [109.226 kg (240 lb 12.8 oz)] 240 lb 12.8 oz (109.226 kg) (12/11 [id number]) Weight change: -0.474 kg (-1 lb 0.7 oz) Last BM Date: 08/05/11  Intake/Output from previous day: 12/10 0701 - 12/11 0700 In: 3000 [P.O.:1200; I.V.:1500; IV Piggyback:300] Out: 2100 [Urine:2100] Total I/O In: 480 [P.O.:480] Out: 350 [Urine:350]   Physical Exam: General: Alert, awake, oriented x3, in no acute distress. HEENT: No bruits, no goiter. Heart: Regular rate and rhythm, without murmurs, rubs, gallops. Lungs: Decreased breath sounds on right. Abdomen: Soft, nontender, nondistended, positive bowel sounds. Extremities: No clubbing cyanosis or edema with positive pedal pulses. Neuro: Grossly intact, nonfocal.    Lab Results: Basic Metabolic Panel:  Basename 08/13/11 0450 08/12/11 0200  NA 132* 130*  K 4.6 4.3  CL 100 95*  CO2 24 25  GLUCOSE 210* 289*  BUN 39* 40*  CREATININE 1.06 1.39*  CALCIUM 9.5 9.4  MG -- --  PHOS -- --   Liver Function Tests:  Basename 08/12/11 0200  AST 9  ALT 13  ALKPHOS 68  BILITOT 1.4*  PROT 7.3  ALBUMIN 2.8*   CBC:  Basename 08/13/11 0450 08/12/11 0200 08/11/11 1528  WBC 19.3* 24.1* --  NEUTROABS -- -- 18.8*  HGB 11.0* 11.7* --  HCT 32.1* 32.8* --  MCV 86.3 85.4 --  PLT 255 242 --   Cardiac Enzymes:  Basename 08/12/11 1055 08/12/11 0200 08/11/11 1808  CKTOTAL 30 30 27   CKMB 1.8 1.6 1.5  CKMBINDEX -- -- --  TROPONINI <0.30 <0.30 <0.30       Basename 08/13/11 1214 08/13/11 0749 08/12/11 2141 08/12/11 1654 08/12/11 1154 08/12/11 0825  GLUCAP 333* 212* 267* 207* 290* 233*    Coagulation:  Basename 08/12/11 0200  LABPROT 16.5*  INR 1.31   Urine Drug Screen: Drugs of Abuse  No results found for this basename: labopia,  cocainscrnur,  labbenz,  amphetmu,  thcu,  labbarb     Recent Results (from the past 240 hour(s))  CULTURE, BLOOD (ROUTINE X 2)     Status: Normal (Preliminary result)   Collection Time   08/11/11  6:40 PM      Component Value Range Status Comment   Specimen Description BLOOD LEFT ARM   Final    Special Requests BOTTLES DRAWN AEROBIC AND ANAEROBIC 10CC EACH   Final    Setup Time [id number]   Final    Culture     Final    Value:        BLOOD CULTURE RECEIVED NO GROWTH TO DATE CULTURE WILL BE HELD FOR 5 DAYS BEFORE ISSUING A FINAL NEGATIVE REPORT   Report Status PENDING   Incomplete   CULTURE, BLOOD (ROUTINE X 2)     Status: Normal (Preliminary result)   Collection Time   08/11/11  6:45 PM      Component Value Range Status Comment   Specimen Description BLOOD LEFT HAND   Final    Special Requests BOTTLES DRAWN AEROBIC AND ANAEROBIC 8  CC EACH   Final    Setup Time [id number]   Final    Culture     Final    Value:        BLOOD CULTURE RECEIVED NO GROWTH TO DATE CULTURE WILL BE HELD FOR 5 DAYS BEFORE ISSUING A FINAL NEGATIVE REPORT   Report Status PENDING   Incomplete   URINE CULTURE     Status: Normal   Collection Time   08/11/11  7:31 PM      Component Value Range Status Comment   Specimen Description URINE, CLEAN CATCH   Final    Special Requests NONE   Final    Setup Time [id number]   Final    Colony Count NO GROWTH   Final    Culture NO GROWTH   Final    Report Status 08/13/2011 FINAL   Final   CULTURE, SPUTUM-ASSESSMENT     Status: Normal   Collection Time   08/12/11 10:30 AM      Component Value Range Status Comment   Specimen Description SPUTUM   Final    Special Requests NONE    Final    Sputum evaluation     Final    Value: THIS SPECIMEN IS ACCEPTABLE. RESPIRATORY CULTURE REPORT TO FOLLOW.   Report Status 08/12/2011 FINAL   Final   CULTURE, RESPIRATORY     Status: Normal (Preliminary result)   Collection Time   08/12/11 10:30 AM      Component Value Range Status Comment   Specimen Description SPUTUM   Final    Special Requests NONE   Final    Gram Stain     Final    Value: FEW WBC PRESENT, PREDOMINANTLY PMN     RARE SQUAMOUS EPITHELIAL CELLS PRESENT     RARE GRAM POSITIVE COCCI IN PAIRS     RARE GRAM NEGATIVE RODS   Culture NORMAL OROPHARYNGEAL FLORA   Final    Report Status PENDING   Incomplete     Studies/Results: Dg Chest 2 View  08/12/2011  *RADIOLOGY REPORT*  Clinical Data: Cough, fever  CHEST - 2 VIEW  Comparison: 08/10/2009  Findings: Cardiomediastinal silhouette is stable.  There is increasing right pleural effusion with right basilar atelectasis or infiltrate.  Stable postsurgical changes. No pulmonary edema.  IMPRESSION: Increasing right pleural effusion with right basilar atelectasis or infiltrate.  No pulmonary edema.  Original Report Authenticated By: Lahoma Crocker, M.D.    Medications: Scheduled Meds:    . aspirin  81 mg Oral Daily  . azithromycin  500 mg Intravenous Q24H  . cefTRIAXone (ROCEPHIN)  IV  1 g Intravenous Q24H  . docusate sodium  100 mg Oral BID  . febuxostat  80 mg Oral Daily  . gabapentin  600 mg Oral TID  . guaiFENesin  600 mg Oral BID  . insulin aspart  0-15 Units Subcutaneous TID WC  . insulin aspart  3 Units Subcutaneous TID WC  . insulin glargine  30 Units Subcutaneous QHS  . ipratropium  0.5 mg Nebulization Q6H  . levalbuterol  0.63 mg Nebulization Q6H  . nebivolol  10 mg Oral Daily  . oseltamivir  75 mg Oral BID  . Tamsulosin HCl  0.4 mg Oral Daily  . vitamin A & D       Continuous Infusions:    . sodium chloride 75 mL/hr at 08/12/11 1500   PRN Meds:.acetaminophen, acetaminophen, albuterol, ibuprofen,  morphine, ondansetron (ZOFRAN) IV, ondansetron, oxyCODONE-acetaminophen  Assessment/Plan:   PNA (pneumonia)  Continue Ceftriaxone and zithromax. Will get CBC in am. DM2 (diabetes mellitus, type 2) Blood glucose is elevated. Will increase Lantus to 50 units. Increase the meal coverage to 4 units subcutaneous tid. HTN (hypertension) Continue nebivolol. 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 Leukocytosis Sec to pneumonia.    LOS: 2 days   [facility] Pager: [phone] 08/13/2011, 4:13 PM

## 2011-08-13 NOTE — Progress Notes (Signed)
Inpatient Diabetes Program Recommendations  AACE/ADA: New Consensus Statement on Inpatient Glycemic Control (2009)  Target Ranges:  Prepandial:   less than 140 mg/dL      Peak postprandial:   less than 180 mg/dL (1-2 hours)      Critically ill patients:  140 - 180 mg/dL   Reason for Visit: Hyperglycemia  Inpatient Diabetes Program Recommendations Insulin - Basal: Increase Lantus to 40 units QHS - Titrate until FBS is <150 mg/dL. Home dose is Lantus 80 units QHS Insulin - Meal Coverage: Increase Novolog to 6 units tidwc if pt eats >50% meal  Note:

## 2011-08-14 LAB — CBC
MCH: 29.7 pg (ref 26.0–34.0)
MCHC: 34.2 g/dL (ref 30.0–36.0)
MCV: 86.9 fL (ref 78.0–100.0)
Platelets: 256 10*3/uL (ref 150–400)
RDW: 13.2 % (ref 11.5–15.5)
WBC: 17.7 10*3/uL — ABNORMAL HIGH (ref 4.0–10.5)

## 2011-08-14 LAB — CULTURE, RESPIRATORY W GRAM STAIN: Culture: NORMAL

## 2011-08-14 LAB — GLUCOSE, CAPILLARY: Glucose-Capillary: 235 mg/dL — ABNORMAL HIGH (ref 70–99)

## 2011-08-14 LAB — BASIC METABOLIC PANEL
Calcium: 9.7 mg/dL (ref 8.4–10.5)
Chloride: 100 mEq/L (ref 96–112)
Creatinine, Ser: 1.04 mg/dL (ref 0.50–1.35)
GFR calc Af Amer: >90 mL/min (ref >90)
GFR calc non Af Amer: 81 mL/min — ABNORMAL LOW (ref >90)

## 2011-08-14 MED ORDER — VITAMINS A & D EX OINT
TOPICAL_OINTMENT | CUTANEOUS | Status: AC
  Administered 2011-08-14: 17:00:00
  Filled 2011-08-14: qty 5

## 2011-08-14 MED ORDER — INSULIN GLARGINE 100 UNIT/ML ~~LOC~~ SOLN
60.0000 [IU] | Freq: Every day | SUBCUTANEOUS | Status: DC
  Administered 2011-08-15: 60 [IU] via SUBCUTANEOUS

## 2011-08-14 NOTE — Progress Notes (Signed)
Patient ID: Curtis Mays, male   DOB: [birth year], 50 y.o.   MRN: [id number] Subjective: No events overnight. Patient denies chest pain, shortness of breath, abdominal pain. Had bowel movement and reports ambulating.  Objective:  Vital signs in last 24 hours:  Filed Vitals:   08/14/11 1425 08/14/11 1450 08/14/11 2052 08/14/11 2154  BP: 160/77   179/88  Pulse: 81   88  Temp: 98 F (36.7 C)   99.1 F (37.3 C)  TempSrc: Oral   Oral  Resp: 19   18  Height:      Weight:      SpO2: 99% 98% 99% 98%    Intake/Output from previous day:   Intake/Output Summary (Last 24 hours) at 08/14/11 2252 Last data filed at 08/14/11 1958  Gross per 24 hour  Intake   1340 ml  Output   1850 ml  Net   -510 ml    Physical Exam: General: Alert, awake, oriented x3, in no acute distress. HEENT: No bruits, no goiter. Moist mucous membranes, no scleral icterus, no conjunctival pallor. Heart: Regular rate and rhythm, without murmurs, rubs, gallops. Lungs: Clear to auscultation bilaterally. No wheezing, no rhonchi, no rales.  Abdomen: Soft, nontender, nondistended, positive bowel sounds. Extremities: No clubbing cyanosis or edema,  positive pedal pulses. Neuro: Grossly intact, nonfocal.    Lab Results:  Basic Metabolic Panel:    Component Value Date/Time   NA 133* 08/14/2011 0435   K 4.9 08/14/2011 0435   CL 100 08/14/2011 0435   CO2 25 08/14/2011 0435   BUN 37* 08/14/2011 0435   CREATININE 1.04 08/14/2011 0435   GLUCOSE 263* 08/14/2011 0435   CALCIUM 9.7 08/14/2011 0435   CBC:    Component Value Date/Time   WBC 17.7* 08/14/2011 0435   HGB 10.4* 08/14/2011 0435   HCT 30.4* 08/14/2011 0435   PLT 256 08/14/2011 0435   MCV 86.9 08/14/2011 0435   NEUTROABS 18.8* 08/11/2011 1528   LYMPHSABS 0.7 08/11/2011 1528   MONOABS 1.5* 08/11/2011 1528   EOSABS 0.1 08/11/2011 1528   BASOSABS 0.0 08/11/2011 1528      Lab 08/14/11 0435 08/13/11 0450 08/12/11 0200 08/11/11 1528  WBC 17.7* 19.3* 24.1*  21.0*  HGB 10.4* 11.0* 11.7* 13.6  HCT 30.4* 32.1* 32.8* 37.1*  PLT 256 255 242 272  MCV 86.9 86.3 85.4 84.9  MCH 29.7 29.6 30.5 31.1  MCHC 34.2 34.3 35.7 36.7*  RDW 13.2 13.2 13.4 13.2  LYMPHSABS -- -- -- 0.7  MONOABS -- -- -- 1.5*  EOSABS -- -- -- 0.1  BASOSABS -- -- -- 0.0  BANDABS -- -- -- --    Lab 08/14/11 0435 08/13/11 0450 08/12/11 0200 08/11/11 1528  NA 133* 132* 130* 132*  K 4.9 4.6 4.3 5.9*  CL 100 100 95* 95*  CO2 25 24 25 25   GLUCOSE 263* 210* 289* 272*  BUN 37* 39* 40* 32*  CREATININE 1.04 1.06 1.39* 1.02  CALCIUM 9.7 9.5 9.4 10.2  MG -- -- -- --    Lab 08/12/11 0200  INR 1.31  PROTIME --   Cardiac markers:  Lab 08/12/11 1055 08/12/11 0200 08/11/11 1808  CKMB 1.8 1.6 1.5  TROPONINI <0.30 <0.30 <0.30  MYOGLOBIN -- -- --   No components found with this basename: POCBNP:3 Recent Results (from the past 240 hour(s))  CULTURE, BLOOD (ROUTINE X 2)     Status: Normal (Preliminary result)   Collection Time   08/11/11  6:40 PM  Component Value Range Status Comment   Specimen Description BLOOD LEFT ARM   Final    Special Requests BOTTLES DRAWN AEROBIC AND ANAEROBIC 10CC EACH   Final    Setup Time [id number]   Final    Culture     Final    Value:        BLOOD CULTURE RECEIVED NO GROWTH TO DATE CULTURE WILL BE HELD FOR 5 DAYS BEFORE ISSUING A FINAL NEGATIVE REPORT   Report Status PENDING   Incomplete   CULTURE, BLOOD (ROUTINE X 2)     Status: Normal (Preliminary result)   Collection Time   08/11/11  6:45 PM      Component Value Range Status Comment   Specimen Description BLOOD LEFT HAND   Final    Special Requests BOTTLES DRAWN AEROBIC AND ANAEROBIC 8 CC EACH   Final    Setup Time [id number]   Final    Culture     Final    Value:        BLOOD CULTURE RECEIVED NO GROWTH TO DATE CULTURE WILL BE HELD FOR 5 DAYS BEFORE ISSUING A FINAL NEGATIVE REPORT   Report Status PENDING   Incomplete   URINE CULTURE     Status: Normal   Collection Time   08/11/11   7:31 PM      Component Value Range Status Comment   Specimen Description URINE, CLEAN CATCH   Final    Special Requests NONE   Final    Setup Time [id number]   Final    Colony Count NO GROWTH   Final    Culture NO GROWTH   Final    Report Status 08/13/2011 FINAL   Final   CULTURE, SPUTUM-ASSESSMENT     Status: Normal   Collection Time   08/12/11 10:30 AM      Component Value Range Status Comment   Specimen Description SPUTUM   Final    Special Requests NONE   Final    Sputum evaluation     Final    Value: THIS SPECIMEN IS ACCEPTABLE. RESPIRATORY CULTURE REPORT TO FOLLOW.   Report Status 08/12/2011 FINAL   Final   CULTURE, RESPIRATORY     Status: Normal   Collection Time   08/12/11 10:30 AM      Component Value Range Status Comment   Specimen Description SPUTUM   Final    Special Requests NONE   Final    Gram Stain     Final    Value: FEW WBC PRESENT, PREDOMINANTLY PMN     RARE SQUAMOUS EPITHELIAL CELLS PRESENT     RARE GRAM POSITIVE COCCI IN PAIRS     RARE GRAM NEGATIVE RODS   Culture NORMAL OROPHARYNGEAL FLORA   Final    Report Status 08/14/2011 FINAL   Final   URINE CULTURE     Status: Normal   Collection Time   08/12/11  7:15 PM      Component Value Range Status Comment   Specimen Description URINE, RANDOM   Final    Special Requests Zithromax, Rocephin   Final    Setup Time [id number]   Final    Colony Count NO GROWTH   Final    Culture NO GROWTH   Final    Report Status 08/13/2011 FINAL   Final     Studies/Results: No results found.  Medications: Scheduled Meds:   . aspirin  81 mg Oral Daily  . azithromycin  500 mg Intravenous Q24H  .  cefTRIAXone (ROCEPHIN)  IV  1 g Intravenous Q24H  . docusate sodium  100 mg Oral BID  . febuxostat  80 mg Oral Daily  . gabapentin  600 mg Oral TID  . guaiFENesin  600 mg Oral BID  . insulin aspart  0-15 Units Subcutaneous TID WC  . insulin aspart  4 Units Subcutaneous TID WC  . insulin glargine  50 Units Subcutaneous  QHS  . ipratropium  0.5 mg Nebulization Q6H WA  . levalbuterol  0.63 mg Nebulization Q6H WA  . nebivolol  10 mg Oral Daily  . oseltamivir  75 mg Oral BID  . Tamsulosin HCl  0.4 mg Oral Daily  . vitamin A & D       Continuous Infusions:   . sodium chloride 10 mL/hr at 08/13/11 1614   PRN Meds:.acetaminophen, acetaminophen, albuterol, ibuprofen, morphine, ondansetron (ZOFRAN) IV, ondansetron, oxyCODONE-acetaminophen  Assessment/Plan:  PNA (pneumonia)  Continue Ceftriaxone and zithromax.  Will get CBC in am.   DM2 (diabetes mellitus, type 2)  Blood glucose is elevated.  Will increase Lantus to 60 units.   HTN (hypertension)  Continue nebivolol.   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   Leukocytosis  Sec to pneumonia.     LOS: 3 days   MAGICK-Daaiyah Baumert 08/14/2011, 10:52 PM

## 2011-08-15 LAB — BASIC METABOLIC PANEL
BUN: 32 mg/dL — ABNORMAL HIGH (ref 6–23)
Chloride: 100 mEq/L (ref 96–112)
GFR calc Af Amer: >90 mL/min (ref >90)
GFR calc non Af Amer: >90 mL/min (ref >90)
Potassium: 4.9 mEq/L (ref 3.5–5.1)
Sodium: 132 mEq/L — ABNORMAL LOW (ref 135–145)

## 2011-08-15 LAB — GLUCOSE, CAPILLARY
Glucose-Capillary: 237 mg/dL — ABNORMAL HIGH (ref 70–99)
Glucose-Capillary: 338 mg/dL — ABNORMAL HIGH (ref 70–99)
Glucose-Capillary: 223 mg/dL — ABNORMAL HIGH (ref 70–99)
Glucose-Capillary: 295 mg/dL — ABNORMAL HIGH (ref 70–99)

## 2011-08-15 LAB — CBC
MCHC: 33.1 g/dL (ref 30.0–36.0)
Platelets: 286 10*3/uL (ref 150–400)
RDW: 12.9 % (ref 11.5–15.5)
WBC: 15.7 10*3/uL — ABNORMAL HIGH (ref 4.0–10.5)

## 2011-08-15 MED ORDER — HYDRALAZINE HCL 20 MG/ML IJ SOLN
10.0000 mg | Freq: Three times a day (TID) | INTRAMUSCULAR | Status: DC | PRN
  Administered 2011-08-15: 10 mg via INTRAVENOUS

## 2011-08-15 MED ORDER — ALBUTEROL SULFATE (5 MG/ML) 0.5% IN NEBU
INHALATION_SOLUTION | RESPIRATORY_TRACT | Status: AC
  Filled 2011-08-15: qty 0.5

## 2011-08-15 MED ORDER — INSULIN GLARGINE 100 UNIT/ML ~~LOC~~ SOLN
75.0000 [IU] | Freq: Every day | SUBCUTANEOUS | Status: DC
  Filled 2011-08-15: qty 3

## 2011-08-15 MED ORDER — CLONIDINE HCL 0.1 MG PO TABS
0.1000 mg | ORAL_TABLET | Freq: Once | ORAL | Status: AC
  Administered 2011-08-15: 0.1 mg via ORAL
  Filled 2011-08-15: qty 1

## 2011-08-15 MED ORDER — HYDRALAZINE HCL 20 MG/ML IJ SOLN
INTRAMUSCULAR | Status: AC
  Administered 2011-08-15: 10 mg via INTRAVENOUS
  Filled 2011-08-15: qty 1

## 2011-08-15 NOTE — Progress Notes (Signed)
Patient ID: Curtis Mays, male   DOB: July 29, 1961, 50 y.o.   MRN: [id number]  Subjective: No events overnight. Patient denies chest pain, shortness of breath, abdominal pain. Had bowel movement and reports ambulating.  Objective:  Vital signs in last 24 hours:  Filed Vitals:   08/15/11 1745 08/15/11 1938 08/15/11 2103 08/15/11 2239  BP: 177/80 180/80 169/77 168/76  Pulse:  93 97 93  Temp:   98.4 F (36.9 C)   TempSrc:   Oral   Resp:   20   Height:      Weight:      SpO2:   95% 95%    Intake/Output from previous day:   Intake/Output Summary (Last 24 hours) at 08/15/11 2300 Last data filed at 08/15/11 1843  Gross per 24 hour  Intake    540 ml  Output   2151 ml  Net  -1611 ml    Physical Exam: General: Alert, awake, oriented x3, in no acute distress. HEENT: No bruits, no goiter. Moist mucous membranes, no scleral icterus, no conjunctival pallor. Heart: Regular rate and rhythm, without murmurs, rubs, gallops. Lungs: Clear to auscultation bilaterally. No wheezing, no rhonchi, no rales.  Abdomen: Soft, nontender, nondistended, positive bowel sounds. Extremities: No clubbing cyanosis or edema,  positive pedal pulses. Neuro: Grossly intact, nonfocal.    Lab Results:  Basic Metabolic Panel:    Component Value Date/Time   NA 132* 08/15/2011 0426   K 4.9 08/15/2011 0426   CL 100 08/15/2011 0426   CO2 24 08/15/2011 0426   BUN 32* 08/15/2011 0426   CREATININE 0.98 08/15/2011 0426   GLUCOSE 258* 08/15/2011 0426   CALCIUM 9.8 08/15/2011 0426   CBC:    Component Value Date/Time   WBC 15.7* 08/15/2011 0426   HGB 10.2* 08/15/2011 0426   HCT 30.8* 08/15/2011 0426   PLT 286 08/15/2011 0426   MCV 86.3 08/15/2011 0426   NEUTROABS 18.8* 08/11/2011 1528   LYMPHSABS 0.7 08/11/2011 1528   MONOABS 1.5* 08/11/2011 1528   EOSABS 0.1 08/11/2011 1528   BASOSABS 0.0 08/11/2011 1528      Lab 08/15/11 0426 08/14/11 0435 08/13/11 0450 08/12/11 0200 08/11/11 1528  WBC 15.7* 17.7*  19.3* 24.1* 21.0*  HGB 10.2* 10.4* 11.0* 11.7* 13.6  HCT 30.8* 30.4* 32.1* 32.8* 37.1*  PLT 286 256 255 242 272  MCV 86.3 86.9 86.3 85.4 84.9  MCH 28.6 29.7 29.6 30.5 31.1  MCHC 33.1 34.2 34.3 35.7 36.7*  RDW 12.9 13.2 13.2 13.4 13.2  LYMPHSABS -- -- -- -- 0.7  MONOABS -- -- -- -- 1.5*  EOSABS -- -- -- -- 0.1  BASOSABS -- -- -- -- 0.0  BANDABS -- -- -- -- --    Lab 08/15/11 0426 08/14/11 0435 08/13/11 0450 08/12/11 0200 08/11/11 1528  NA 132* 133* 132* 130* 132*  K 4.9 4.9 4.6 4.3 5.9*  CL 100 100 100 95* 95*  CO2 24 25 24 25 25   GLUCOSE 258* 263* 210* 289* 272*  BUN 32* 37* 39* 40* 32*  CREATININE 0.98 1.04 1.06 1.39* 1.02  CALCIUM 9.8 9.7 9.5 9.4 10.2  MG -- -- -- -- --    Lab 08/12/11 0200  INR 1.31  PROTIME --   Cardiac markers:  Lab 08/12/11 1055 08/12/11 0200 08/11/11 1808  CKMB 1.8 1.6 1.5  TROPONINI <0.30 <0.30 <0.30  MYOGLOBIN -- -- --   No components found with this basename: POCBNP:3 Recent Results (from the past 240 hour(s))  CULTURE, BLOOD (  ROUTINE X 2)     Status: Normal (Preliminary result)   Collection Time   08/11/11  6:40 PM      Component Value Range Status Comment   Specimen Description BLOOD LEFT ARM   Final    Special Requests BOTTLES DRAWN AEROBIC AND ANAEROBIC 10CC EACH   Final    Setup Time [id number]   Final    Culture     Final    Value:        BLOOD CULTURE RECEIVED NO GROWTH TO DATE CULTURE WILL BE HELD FOR 5 DAYS BEFORE ISSUING A FINAL NEGATIVE REPORT   Report Status PENDING   Incomplete   CULTURE, BLOOD (ROUTINE X 2)     Status: Normal (Preliminary result)   Collection Time   08/11/11  6:45 PM      Component Value Range Status Comment   Specimen Description BLOOD LEFT HAND   Final    Special Requests BOTTLES DRAWN AEROBIC AND ANAEROBIC 8 CC EACH   Final    Setup Time [id number]   Final    Culture     Final    Value:        BLOOD CULTURE RECEIVED NO GROWTH TO DATE CULTURE WILL BE HELD FOR 5 DAYS BEFORE ISSUING A FINAL NEGATIVE  REPORT   Report Status PENDING   Incomplete   URINE CULTURE     Status: Normal   Collection Time   08/11/11  7:31 PM      Component Value Range Status Comment   Specimen Description URINE, CLEAN CATCH   Final    Special Requests NONE   Final    Setup Time [id number]   Final    Colony Count NO GROWTH   Final    Culture NO GROWTH   Final    Report Status 08/13/2011 FINAL   Final   CULTURE, SPUTUM-ASSESSMENT     Status: Normal   Collection Time   08/12/11 10:30 AM      Component Value Range Status Comment   Specimen Description SPUTUM   Final    Special Requests NONE   Final    Sputum evaluation     Final    Value: THIS SPECIMEN IS ACCEPTABLE. RESPIRATORY CULTURE REPORT TO FOLLOW.   Report Status 08/12/2011 FINAL   Final   CULTURE, RESPIRATORY     Status: Normal   Collection Time   08/12/11 10:30 AM      Component Value Range Status Comment   Specimen Description SPUTUM   Final    Special Requests NONE   Final    Gram Stain     Final    Value: FEW WBC PRESENT, PREDOMINANTLY PMN     RARE SQUAMOUS EPITHELIAL CELLS PRESENT     RARE GRAM POSITIVE COCCI IN PAIRS     RARE GRAM NEGATIVE RODS   Culture NORMAL OROPHARYNGEAL FLORA   Final    Report Status 08/14/2011 FINAL   Final   URINE CULTURE     Status: Normal   Collection Time   08/12/11  7:15 PM      Component Value Range Status Comment   Specimen Description URINE, RANDOM   Final    Special Requests Zithromax, Rocephin   Final    Setup Time [id number]   Final    Colony Count NO GROWTH   Final    Culture NO GROWTH   Final    Report Status 08/13/2011 FINAL   Final     Studies/Results:  No results found.  Medications: Scheduled Meds:   . aspirin  81 mg Oral Daily  . azithromycin  500 mg Intravenous Q24H  . cefTRIAXone (ROCEPHIN)  IV  1 g Intravenous Q24H  . cloNIDine  0.1 mg Oral Once  . docusate sodium  100 mg Oral BID  . febuxostat  80 mg Oral Daily  . gabapentin  600 mg Oral TID  . guaiFENesin  600 mg Oral BID    . insulin aspart  0-15 Units Subcutaneous TID WC  . insulin aspart  4 Units Subcutaneous TID WC  . insulin glargine  60 Units Subcutaneous QHS  . ipratropium  0.5 mg Nebulization Q6H WA  . levalbuterol  0.63 mg Nebulization Q6H WA  . nebivolol  10 mg Oral Daily  . oseltamivir  75 mg Oral BID  . Tamsulosin HCl  0.4 mg Oral Daily  . DISCONTD: insulin glargine  50 Units Subcutaneous QHS   Continuous Infusions:   . sodium chloride 10 mL/hr at 08/13/11 1614   PRN Meds:.acetaminophen, acetaminophen, albuterol, hydrALAZINE, ibuprofen, morphine, ondansetron (ZOFRAN) IV, ondansetron, oxyCODONE-acetaminophen  Assessment/Plan:  Principal Problem:  *PNA (pneumonia) - clinically improving - continue the same medication regimen as noted above - continue supportive care  Active Problems:  DM2 (diabetes mellitus, type 2) - increase lantus to home dose   HTN (hypertension) - well controlled during the hospitalization   Leukocytosis - pt remains afebrile - WBC trending down - obtain CBC in AM   Hyponatremia - stable and at pt's baseline   Hyperkalemia - replete if indicated   LOS: 4 days   MAGICK-MYERS, ISKRA 08/15/2011, 11:00 PM
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
## 2011-08-16 LAB — BASIC METABOLIC PANEL
Chloride: 101 mEq/L (ref 96–112)
Creatinine, Ser: 0.9 mg/dL (ref 0.50–1.35)
GFR calc Af Amer: >90 mL/min (ref >90)
Sodium: 134 mEq/L — ABNORMAL LOW (ref 135–145)

## 2011-08-16 LAB — CBC
MCV: 86.2 fL (ref 78.0–100.0)
Platelets: 279 10*3/uL (ref 150–400)
RDW: 12.8 % (ref 11.5–15.5)
WBC: 17.1 10*3/uL — ABNORMAL HIGH (ref 4.0–10.5)

## 2011-08-16 MED ORDER — MOXIFLOXACIN HCL 400 MG PO TABS: 400.0000 mg | ORAL_TABLET | Freq: Every day | ORAL | Status: AC

## 2011-08-16 MED ORDER — ALBUTEROL SULFATE (5 MG/ML) 0.5% IN NEBU: 2.5000 mg | INHALATION_SOLUTION | RESPIRATORY_TRACT | Status: DC | PRN

## 2011-08-16 MED ORDER — HYDROCODONE-ACETAMINOPHEN 7.5-500 MG PO TABS: 1.0000 | ORAL_TABLET | ORAL | Status: DC | PRN

## 2011-08-16 MED ORDER — GUAIFENESIN ER 600 MG PO TB12: 600.0000 mg | ORAL_TABLET | Freq: Two times a day (BID) | ORAL | Status: DC

## 2011-08-16 NOTE — Progress Notes (Signed)
D/C instructions reviewed w/ patient. Pt verbalizes understanding, all questions answered, no further questions. Pt does state that he does not have a nebulizer machine at home and has never been on inhalers or nebs. MD did write a script for Albuterol neb.  This was clarified w/ Dr Romilda Joy who states that the Albuterol is just to be filled if the patient has increasing SOB and that he can purchase a nebulizer machine at any medical supply store.  She states pt will likely not need the script, but left if as a "just in case."  This was explained to the patient who verbalized understanding and is in agreement.

## 2011-08-16 NOTE — Discharge Summary (Signed)
Patient ID: Curtis Mays MRN: [id number] DOB/AGE: December 17, 1960 50 y.o.  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 Qty: 20 mL, Refills: 1    guaiFENesin (MUCINEX) 600 MG 12 hr tablet Take 1 tablet (600 mg total) by mouth 2 (two) times daily. Qty: 45 tablet, Refills: 1    moxifloxacin (AVELOX) 400 MG tablet Take 1 tablet (400 mg total) by mouth daily. Qty: 7 tablet, Refills: 0      CONTINUE these medications which have CHANGED   Details  HYDROcodone-acetaminophen (LORTAB) 7.5-500 MG per tablet Take 1 tablet by mouth every 4 (four) hours as needed. One or two every 4-6 hours prn pain Qty: 40 tablet, Refills: 0   Associated Diagnoses: Pain      CONTINUE these medications which have NOT CHANGED   Details  aspirin 81 MG tablet Take 81 mg by mouth daily.      febuxostat (ULORIC) 40 MG tablet Take 80 mg by mouth daily.     furosemide (LASIX) 80 MG tablet Take 80 mg by mouth daily.      gabapentin (NEURONTIN) 600 MG tablet Take 600 mg by mouth 3 (three) times daily.      ibuprofen (ADVIL,MOTRIN) 200 MG tablet Take 400 mg by mouth every 6 (six) hours as needed. For pain     insulin aspart (NOVOLOG) 100 UNIT/ML injection Inject into the skin 3 (three)  times daily before meals. 10units per 29 carbs/sliding scale    insulin glargine (LANTUS) 100 UNIT/ML injection Inject into the skin at bedtime. 80 units in evening    lisinopril (PRINIVIL,ZESTRIL) 40 MG tablet Take 40 mg by mouth daily.      metFORMIN (GLUCOPHAGE) 1000 MG tablet Take 1,000 mg by mouth 2 (two) times daily with a meal.      nebivolol (BYSTOLIC) 10 MG tablet Take 10 mg by mouth daily.      oxyCODONE (OXY IR/ROXICODONE) 5 MG immediate release tablet 5 mg by Per post-pyloric tube route Every 6 hours as needed. For pain    oxyCODONE-acetaminophen (PERCOCET) 5-325 MG per tablet Take 1 tablet by mouth every 6 (six) hours as needed. For pain    Tamsulosin HCl (FLOMAX) 0.4 MG CAPS Take 0.4 mg by mouth daily.          Disposition and Follow-up: Pt will need to follow up with PCP in 2-3 weeks to check up on resolution of symptoms. He will be discharged on antibiotics so please ensure that he has completed the therapy ( he was given 7 tablets of Avalox to complete over the next week. Please also follow upon CBG control since it has been rather difficult to control his blood sugar levels during the hospitalization. He will also need to have basic metabolic panel checked to ensure that creatinine remains stable and at pt's baseline. Please also check cbc to ensure that WBC,  Hg/Hct remain stable and at pt's baseline.  Consults:  none  Significant Diagnostic Studies:  Dg Chest 2 View 08/12/2011  IMPRESSION: Increasing right pleural effusion with right basilar atelectasis or infiltrate.  No pulmonary edema.    Dg Chest 2 View 08/11/2011   IMPRESSION: No change in a small right effusion and basilar airspace disease consistent with history of prior right lower lobectomy.    Brief H and P: This is a very pleasant 50 year old, past medical history significant for Diabetes, MALT lymphoma, followed by Dr. Durwin Reges, he present to the emergency department complaining of worsening shortness  of breath, cough, and fever. He relates that his symptoms started last Friday (2 days prior to admission). He saw his primary care physician, and he was prescribed Z-pack, and he received steroid injection. He relates that his symptoms are getting worse, that was why he presented to the emergency department. He relates runny nose, headaches when he has fever. He denies chest pain. He does have pain when he cough in the incision site of right mini thoracotomy. He is satting 97% on 2 L.    Physical Exam on Discharge:  Filed Vitals:   08/15/11 2239 08/16/11 0226 08/16/11 0627 08/16/11 0905  BP: 168/76  179/88 163/71  Pulse: 93  93 89  Temp:   100.8 F (38.2 C) 98.3 F (36.8 C)  TempSrc:   Oral Oral  Resp:   28 24  Height:      Weight:   107.775 kg (237 lb 9.6 oz)   SpO2: 95% 95% 95% 97%     Intake/Output Summary (Last 24 hours) at 08/16/11 1002 Last data filed at 08/16/11 0900  Gross per 24 hour  Intake    660 ml  Output   1676 ml  Net  -1016 ml    General: Alert, awake, oriented x3, in no acute distress. HEENT: No bruits, no goiter. Heart: Regular rate and rhythm, without murmurs, rubs, gallops. Lungs: Clear to auscultation bilaterally. Abdomen: Soft, nontender, nondistended, positive bowel sounds. Extremities: No clubbing cyanosis or edema with positive pedal pulses. Neuro: Grossly intact, nonfocal.  CBC:    Component Value Date/Time   WBC 17.1* 08/16/2011 0510   HGB 11.0* 08/16/2011 0510   HCT 31.8* 08/16/2011 0510   PLT 279 08/16/2011 0510   MCV 86.2 08/16/2011 0510   NEUTROABS 18.8* 08/11/2011 1528   LYMPHSABS 0.7 08/11/2011 1528   MONOABS 1.5* 08/11/2011 1528   EOSABS 0.1 08/11/2011 1528   BASOSABS 0.0 08/11/2011 [id number]    Basic Metabolic Panel:    Component Value Date/Time   NA 134* 08/16/2011 0510   K 4.1 08/16/2011 0510   CL 101 08/16/2011 0510   CO2 26 08/16/2011 0510   BUN 24* 08/16/2011 0510   CREATININE 0.90 08/16/2011 0510   GLUCOSE 205* 08/16/2011 0510    CALCIUM 9.6 08/16/2011 0510    Hospital Course:   Principal Problem:  *PNA (pneumonia)  - pt has responded well to treatment with antibiotic and IVF - his PNA was resolving and he has maintained oxygen saturations > 97 % on RA - we was also ambulating well with saturations > 95% on RA - pt will continue to take antibiotic for additional  7 days and will need to follow up with PCP to ensure resolution of symptms  Active Problems:  DM2 (diabetes mellitus, type 2)  - Lantus was increased to pt's home regimen and CBG were relatively well controlled - he will need to follow up with  PCP to ensure that dose of insulin is adequate in diabetes contro  HTN (hypertension)  - well controlled during the hospitalization   Leukocytosis  - pt remained afebrile  - WBC trending down during the course of hospitalization - pt will need follow up CBC in an outpatient setting  Hyponatremia  - stable during the hospitalization and at pt's baseline   Disposition - plan of care and diagnosis, diagnostic studies and test results were discussed with pt and family at bedside - pt and  family verbalized understanding  Time spent on Discharge: Over 30 minutes  Signed: MAGICK-Emmersen Garraway 08/16/2011, 10:02 AM

## 2011-08-16 NOTE — Progress Notes (Signed)
Pt wife here for d/c home. Pt d/c in w/c in stable condition by this RN. Pt states he is in possession of d/c instructions, scripts, and all personal belongings.

## 2011-08-16 NOTE — Plan of Care (Signed)
Problem: Phase II Progression Outcomes Goal: Patient states signs/symptoms high/low CBGs & treatment Outcome: Completed/Met Date Met:  08/16/11 Patient verbalized

## 2011-08-16 NOTE — Progress Notes (Addendum)
Patient had 4 bts vtach  @0139 .  Patient asymptomatic and resting comfortably.  Will continue to monitor. At [place] patient had 29 beat run of SVT.  Patient resting upright in chair

## 2011-08-18 LAB — CULTURE, BLOOD (ROUTINE X 2)
Culture  Setup Time: 201212100213
Culture  Setup Time: 201212100213

## 2011-09-09 ENCOUNTER — Other Ambulatory Visit: Payer: Self-pay | Admitting: Cardiothoracic Surgery

## 2011-09-09 DIAGNOSIS — D381 Neoplasm of uncertain behavior of trachea, bronchus and lung: Secondary | ICD-10-CM

## 2011-09-11 ENCOUNTER — Ambulatory Visit
Admission: RE | Admit: 2011-09-11 | Discharge: 2011-09-11 | Disposition: A | Discharge status: Home / Self Care | Payer: Medicare Other | Source: Ambulatory Visit | Attending: Cardiothoracic Surgery | Admitting: Cardiothoracic Surgery

## 2011-09-11 ENCOUNTER — Encounter: Payer: Self-pay | Admitting: Cardiothoracic Surgery

## 2011-09-11 ENCOUNTER — Ambulatory Visit (INDEPENDENT_AMBULATORY_CARE_PROVIDER_SITE_OTHER): Payer: Medicare Other | Admitting: Cardiothoracic Surgery

## 2011-09-11 VITALS — BP 160/80 | HR 72 | Resp 16 | Ht 71.0 in | Wt 230.0 lb

## 2011-09-11 DIAGNOSIS — Z09 Encounter for follow-up examination after completed treatment for conditions other than malignant neoplasm: Secondary | ICD-10-CM

## 2011-09-11 DIAGNOSIS — C3430 Malignant neoplasm of lower lobe, unspecified bronchus or lung: Secondary | ICD-10-CM

## 2011-09-11 DIAGNOSIS — D381 Neoplasm of uncertain behavior of trachea, bronchus and lung: Secondary | ICD-10-CM

## 2011-09-11 NOTE — Patient Instructions (Signed)
Do not smopke

## 2011-09-11 NOTE — Progress Notes (Signed)
HPI:                            [address]            [place] [address]          [phone]      The patient returns for a 3 month followup after undergoing resection of a B-cell lymphoma (M. ALT) tumor with superior segmentectomy of the right lower lobe. This was a stage I lesion and no further chemotherapy is planned. The patient is being followed by Dr. Murriel Hopper. He is now 3 months following surgery. He was recently admitted to the [facility] long hospital for treatment of a pneumonia. Due to the pneumonia and recent thoracotomy has lost approximately 20 pounds. He is a nonsmoker. He is having some expected postthoracotomy pain.  Current Outpatient Prescriptions  Medication Sig Dispense Refill  . aspirin 81 MG tablet Take 81 mg by mouth daily.        . febuxostat (ULORIC) 40 MG tablet Take 80 mg by mouth daily.       . furosemide (LASIX) 80 MG tablet Take 80 mg by mouth daily.        Marland Kitchen gabapentin (NEURONTIN) 600 MG tablet Take 600 mg by mouth 3 (three) times daily.       Marland Kitchen ibuprofen (ADVIL,MOTRIN) 200 MG tablet Take 400 mg by mouth every 6 (six) hours as needed. For pain       . insulin aspart (NOVOLOG) 100 UNIT/ML injection Inject into the skin 3 (three) times daily before meals. 10units per 29 carbs/sliding scale      . insulin glargine (LANTUS) 100 UNIT/ML injection Inject into the skin at bedtime. 80 units in evening      . lisinopril (PRINIVIL,ZESTRIL) 40 MG tablet Take 40 mg by mouth daily.        . metFORMIN (GLUCOPHAGE) 1000 MG tablet Take 1,000 mg by mouth 2 (two) times daily with a meal.        . nebivolol (BYSTOLIC) 10 MG tablet Take 10 mg by mouth daily.        Marland Kitchen oxyCODONE-acetaminophen (PERCOCET) 5-325 MG per tablet Take 1 tablet by mouth every 6 (six) hours as needed. For pain      . Tamsulosin HCl (FLOMAX) 0.4 MG CAPS Take 0.4 mg by mouth daily.           Physical Exam: Vital signs afebrile blood pressure 160/80 pulse 72 regular room-air  saturation 97% weight 2:30 height 5 foot 11 BMI 32.1 Breath sounds clear and equal Thoracotomy scar well healed Cardiac rhythm regular Good range of motion of right upper cavity  Diagnostic Tests: PA and lateral chest x-ray reveals a postoperative changes in the right lower lung field otherwise no evidence of recurrent disease  Impression: Stable course 3 months following resection for stage I MALT tumor from the right lower lobe  Plan: The patient will be followed by Dr. Verlin Grills return appeared are detected the patient back in a time for any thoracic surgical issues. Patient to resume normal activity.

## 2011-10-24 DIAGNOSIS — Z0271 Encounter for disability determination: Secondary | ICD-10-CM

## 2011-11-18 ENCOUNTER — Ambulatory Visit (HOSPITAL_COMMUNITY)
Admission: RE | Admit: 2011-11-18 | Discharge: 2011-11-18 | Disposition: A | Discharge status: Home / Self Care | Payer: Medicare Other | Source: Ambulatory Visit | Attending: Oncology | Admitting: Oncology

## 2011-11-18 ENCOUNTER — Other Ambulatory Visit (HOSPITAL_BASED_OUTPATIENT_CLINIC_OR_DEPARTMENT_OTHER): Payer: Medicare Other | Admitting: Lab

## 2011-11-18 ENCOUNTER — Other Ambulatory Visit: Payer: Self-pay | Admitting: Oncology

## 2011-11-18 ENCOUNTER — Encounter (HOSPITAL_COMMUNITY): Payer: Self-pay

## 2011-11-18 DIAGNOSIS — C8589 Other specified types of non-Hodgkin lymphoma, extranodal and solid organ sites: Secondary | ICD-10-CM

## 2011-11-18 DIAGNOSIS — C884 Extranodal marginal zone B-cell lymphoma of mucosa-associated lymphoid tissue [MALT-lymphoma]: Secondary | ICD-10-CM

## 2011-11-18 DIAGNOSIS — R0602 Shortness of breath: Secondary | ICD-10-CM | POA: Insufficient documentation

## 2011-11-18 DIAGNOSIS — I2510 Atherosclerotic heart disease of native coronary artery without angina pectoris: Secondary | ICD-10-CM | POA: Insufficient documentation

## 2011-11-18 HISTORY — DX: Malignant (primary) neoplasm, unspecified: C80.1

## 2011-11-18 LAB — CBC WITH DIFFERENTIAL/PLATELET
BASO%: 0.7 % (ref 0.0–2.0)
LYMPH%: 16 % (ref 14.0–49.0)
MCHC: 35 g/dL (ref 32.0–36.0)
MCV: 86.7 fL (ref 79.3–98.0)
MONO#: 0.6 10*3/uL (ref 0.1–0.9)
MONO%: 7.4 % (ref 0.0–14.0)
Platelets: 191 10*3/uL (ref 140–400)
RBC: 4.81 10*6/uL (ref 4.20–5.82)
RDW: 13.8 % (ref 11.0–14.6)
WBC: 8 10*3/uL (ref 4.0–10.3)

## 2011-11-18 LAB — CMP (CANCER CENTER ONLY)
ALT(SGPT): 24 U/L (ref 10–47)
AST: 23 U/L (ref 11–38)
Alkaline Phosphatase: 66 U/L (ref 26–84)
Sodium: 144 mEq/L (ref 128–145)
Total Bilirubin: 0.6 mg/dl (ref 0.20–1.60)
Total Protein: 7.3 g/dL (ref 6.4–8.1)

## 2011-11-18 MED ORDER — IOHEXOL 300 MG/ML  SOLN
80.0000 mL | Freq: Once | INTRAMUSCULAR | Status: AC | PRN
  Administered 2011-11-18: 80 mL via INTRAVENOUS

## 2011-11-22 ENCOUNTER — Ambulatory Visit: Payer: Medicare Other | Admitting: Oncology

## 2011-12-23 ENCOUNTER — Ambulatory Visit (HOSPITAL_BASED_OUTPATIENT_CLINIC_OR_DEPARTMENT_OTHER): Payer: Medicare Other | Admitting: Oncology

## 2011-12-23 VITALS — BP 156/79 | HR 72 | Temp 97.2°F | Ht 71.0 in | Wt 252.3 lb

## 2011-12-23 DIAGNOSIS — R0989 Other specified symptoms and signs involving the circulatory and respiratory systems: Secondary | ICD-10-CM

## 2011-12-23 DIAGNOSIS — C884 Extranodal marginal zone B-cell lymphoma of mucosa-associated lymphoid tissue [MALT-lymphoma]: Secondary | ICD-10-CM

## 2011-12-23 DIAGNOSIS — C8582 Other specified types of non-Hodgkin lymphoma, intrathoracic lymph nodes: Secondary | ICD-10-CM

## 2011-12-23 DIAGNOSIS — E119 Type 2 diabetes mellitus without complications: Secondary | ICD-10-CM

## 2011-12-23 DIAGNOSIS — I10 Essential (primary) hypertension: Secondary | ICD-10-CM
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
## 2011-12-24 ENCOUNTER — Ambulatory Visit: Payer: Medicare Other | Admitting: Oncology

## 2011-12-26 ENCOUNTER — Other Ambulatory Visit: Payer: Self-pay | Admitting: Oncology

## 2011-12-27 ENCOUNTER — Telehealth: Payer: Self-pay | Admitting: Oncology

## 2011-12-27 NOTE — Telephone Encounter (Signed)
Called pt left message, lab and CT in July, NPO 4 hrs prior to CT. Pt will see md after a few days

## 2012-02-28 ENCOUNTER — Telehealth: Payer: Self-pay | Admitting: Oncology

## 2012-02-28 NOTE — Telephone Encounter (Signed)
Called pt and moved appt to 7/30 due to MD call day,

## 2012-03-23 ENCOUNTER — Other Ambulatory Visit (HOSPITAL_BASED_OUTPATIENT_CLINIC_OR_DEPARTMENT_OTHER): Payer: Medicare Other | Admitting: Lab

## 2012-03-23 ENCOUNTER — Ambulatory Visit (HOSPITAL_COMMUNITY)
Admission: RE | Admit: 2012-03-23 | Discharge: 2012-03-23 | Disposition: A | Discharge status: Home / Self Care | Payer: Medicare Other | Source: Ambulatory Visit | Attending: Oncology | Admitting: Oncology

## 2012-03-23 DIAGNOSIS — I2510 Atherosclerotic heart disease of native coronary artery without angina pectoris: Secondary | ICD-10-CM | POA: Insufficient documentation

## 2012-03-23 DIAGNOSIS — C8589 Other specified types of non-Hodgkin lymphoma, extranodal and solid organ sites: Secondary | ICD-10-CM | POA: Insufficient documentation

## 2012-03-23 DIAGNOSIS — I319 Disease of pericardium, unspecified: Secondary | ICD-10-CM | POA: Insufficient documentation

## 2012-03-23 DIAGNOSIS — I700 Atherosclerosis of aorta: Secondary | ICD-10-CM | POA: Insufficient documentation

## 2012-03-23 DIAGNOSIS — C884 Extranodal marginal zone B-cell lymphoma of mucosa-associated lymphoid tissue [MALT-lymphoma]: Secondary | ICD-10-CM

## 2012-03-23 DIAGNOSIS — J984 Other disorders of lung: Secondary | ICD-10-CM | POA: Insufficient documentation

## 2012-03-23 LAB — CBC WITH DIFFERENTIAL/PLATELET
BASO%: 1.5 % (ref 0.0–2.0)
EOS%: 3.7 % (ref 0.0–7.0)
HCT: 38.7 % (ref 38.4–49.9)
LYMPH%: 20.7 % (ref 14.0–49.0)
MCH: 32.1 pg (ref 27.2–33.4)
MCHC: 35.5 g/dL (ref 32.0–36.0)
NEUT%: 65.9 % (ref 39.0–75.0)
Platelets: 172 10*3/uL (ref 140–400)
lymph#: 1.4 10*3/uL (ref 0.9–3.3)

## 2012-03-23 LAB — CMP (CANCER CENTER ONLY)
Alkaline Phosphatase: 67 U/L (ref 26–84)
BUN, Bld: 40 mg/dL — ABNORMAL HIGH (ref 7–22)
CO2: 28 mEq/L (ref 18–33)
Creat: 1.3 mg/dl — ABNORMAL HIGH (ref 0.6–1.2)
Glucose, Bld: 339 mg/dL — ABNORMAL HIGH (ref 73–118)
Sodium: 140 mEq/L (ref 128–145)
Total Bilirubin: 0.8 mg/dl (ref 0.20–1.60)
Total Protein: 7.2 g/dL (ref 6.4–8.1)

## 2012-03-23 MED ORDER — IOHEXOL 300 MG/ML  SOLN
100.0000 mL | Freq: Once | INTRAMUSCULAR | Status: AC | PRN
  Administered 2012-03-23: 100 mL via INTRAVENOUS

## 2012-03-24 ENCOUNTER — Telehealth: Payer: Self-pay | Admitting: *Deleted

## 2012-03-24 NOTE — Telephone Encounter (Signed)
Called patient and let him know that his CT scans were good, however his blood sugar is up and I will send a copy of his labs to his PCP:  Dr. Regino Schultze  In [place].  Phone is [phone] and fax is [phone]  Or [phone].

## 2012-03-24 NOTE — Telephone Encounter (Signed)
Message copied by Ignacia Felling on Tue Mar 24, 2012 12:29 PM ------      Message from: Annia Belt      Created: Mon Mar 23, 2012  8:44 PM       Call pt 1)  CTs good  2)  Blood sugar high - forward results to his primary care please

## 2012-03-30 ENCOUNTER — Ambulatory Visit: Payer: Medicare Other | Admitting: Oncology

## 2012-03-31 ENCOUNTER — Telehealth: Payer: Self-pay | Admitting: Oncology

## 2012-03-31 ENCOUNTER — Ambulatory Visit (HOSPITAL_BASED_OUTPATIENT_CLINIC_OR_DEPARTMENT_OTHER): Payer: Medicare Other | Admitting: Oncology

## 2012-03-31 VITALS — BP 154/69 | HR 70 | Temp 97.6°F | Ht 71.5 in | Wt 258.9 lb

## 2012-03-31 DIAGNOSIS — I10 Essential (primary) hypertension: Secondary | ICD-10-CM

## 2012-03-31 DIAGNOSIS — C884 Extranodal marginal zone B-cell lymphoma of mucosa-associated lymphoid tissue [MALT-lymphoma]: Secondary | ICD-10-CM

## 2012-03-31 DIAGNOSIS — C8582 Other specified types of non-Hodgkin lymphoma, intrathoracic lymph nodes: Secondary | ICD-10-CM

## 2012-03-31 DIAGNOSIS — E119 Type 2 diabetes mellitus without complications: Secondary | ICD-10-CM

## 2012-03-31 DIAGNOSIS — G8912 Acute post-thoracotomy pain: Secondary | ICD-10-CM

## 2012-03-31 NOTE — Progress Notes (Signed)
CC:   Ivin Poot, M.D. Jenean Lindau, MD  Followup visit for this 51 year old man diagnosed with an extranodal marginal zone B-cell non-Hodgkin's lymphoma found on resection of a solitary right lower lobe pulmonary nodule in October 2012.  Staging evaluation including a PET scan showed no additional disease.  Spleen was borderline enlarged on initial scans but not reported as enlarged on subsequent studies.  Unfortunately, he has developed a postthoracotomy pain syndrome which is unrelieved by the gabapentin that he takes for his diabetic neuropathy. He was recently started on some morphine tablets by his primary care physician.  REVIEW OF SYSTEMS:  Otherwise unremarkable.  PHYSICAL EXAMINATION:  Head and neck:  Normal.  Lungs:  Clear, resonant to percussion.  Heart:  Regular cardiac rhythm.  No murmur.  No lymphadenopathy in the neck, cervical, supraclavicular, axillary or inguinal regions.  Abdomen:  Soft, obese, nontender.  No obvious mass or organomegaly.  Extremities:  No edema.  No calf tenderness.  Neurologic: Grossly normal.  LAB:  Hemoglobin 13.7, hematocrit 38.7, MCV 90, white count 6800, platelets a 172,000.  A random blood sugar was 339.  Serum LDH remains normal at 129.  CT scan chest, abdomen, pelvis done March 23, 2012, which I personally reviewed, shows no evidence for new disease.  Spleen upper limit of normal for his size at 13.1 cm, unchanged.  Small upper abdominal/retroperitoneal lymph nodes, maximum dimension 1.1 cm in the portacaval region, unchanged from prior studies.  IMPRESSION: 1. Mucosa-associated lymphoid tissue-type extranodal B-cell non-     Hodgkin's lymphoma.  Status post resection of solitary right lung     nodule.  He remains free of any obvious new disease now out almost     1 years from surgery in October 2012.  Plan:  I will get a followup scan in 6 months.  I think if this     scan is stable, I will decrease scanning to once a  year. 2. Post-thoracotomy pain syndrome.  This is likely a result from some     damage to small nerves.  I suggested that he might try Lyrica     instead of the gabapentin.  Another thing that might help would be     a topical lidoderm patch. 3. Insulin-dependent diabetes. 4. Essential hypertension.    ______________________________ Annia Belt, M.D., F.A.C.P. JMG/MEDQ  D:  03/31/2012  T:  03/31/2012  Job:  [id number]

## 2012-03-31 NOTE — Telephone Encounter (Signed)
gv pt appt schedule for January 2014 including ct 09/22/12.

## 2012-04-27 ENCOUNTER — Ambulatory Visit: Payer: Medicare Other | Admitting: Oncology

## 2012-06-03 ENCOUNTER — Ambulatory Visit: Payer: Medicare Other | Admitting: Cardiothoracic Surgery

## 2012-09-22 ENCOUNTER — Other Ambulatory Visit (HOSPITAL_BASED_OUTPATIENT_CLINIC_OR_DEPARTMENT_OTHER): Payer: Medicare Other | Admitting: Lab

## 2012-09-22 ENCOUNTER — Other Ambulatory Visit: Payer: Self-pay | Admitting: Oncology

## 2012-09-22 ENCOUNTER — Encounter (HOSPITAL_COMMUNITY): Payer: Self-pay

## 2012-09-22 ENCOUNTER — Ambulatory Visit (HOSPITAL_COMMUNITY)
Admission: RE | Admit: 2012-09-22 | Discharge: 2012-09-22 | Disposition: A | Discharge status: Home / Self Care | Payer: Medicare Other | Source: Ambulatory Visit | Attending: Oncology | Admitting: Oncology

## 2012-09-22 DIAGNOSIS — I2510 Atherosclerotic heart disease of native coronary artery without angina pectoris: Secondary | ICD-10-CM | POA: Insufficient documentation

## 2012-09-22 DIAGNOSIS — E119 Type 2 diabetes mellitus without complications: Secondary | ICD-10-CM

## 2012-09-22 DIAGNOSIS — C8589 Other specified types of non-Hodgkin lymphoma, extranodal and solid organ sites: Secondary | ICD-10-CM | POA: Insufficient documentation

## 2012-09-22 DIAGNOSIS — N289 Disorder of kidney and ureter, unspecified: Secondary | ICD-10-CM | POA: Insufficient documentation

## 2012-09-22 DIAGNOSIS — C884 Extranodal marginal zone B-cell lymphoma of mucosa-associated lymphoid tissue [MALT-lymphoma]: Secondary | ICD-10-CM

## 2012-09-22 DIAGNOSIS — C8582 Other specified types of non-Hodgkin lymphoma, intrathoracic lymph nodes: Secondary | ICD-10-CM

## 2012-09-22 DIAGNOSIS — K4090 Unilateral inguinal hernia, without obstruction or gangrene, not specified as recurrent: Secondary | ICD-10-CM | POA: Insufficient documentation

## 2012-09-22 DIAGNOSIS — E041 Nontoxic single thyroid nodule: Secondary | ICD-10-CM | POA: Insufficient documentation

## 2012-09-22 DIAGNOSIS — R161 Splenomegaly, not elsewhere classified: Secondary | ICD-10-CM | POA: Insufficient documentation

## 2012-09-22 DIAGNOSIS — R1011 Right upper quadrant pain: Secondary | ICD-10-CM | POA: Insufficient documentation

## 2012-09-22 LAB — BASIC METABOLIC PANEL (CC13)
CO2: 28 mEq/L (ref 22–29)
Calcium: 10.1 mg/dL (ref 8.4–10.4)
Creatinine: 1.4 mg/dL — ABNORMAL HIGH (ref 0.7–1.3)
Glucose: 412 mg/dl — ABNORMAL HIGH (ref 70–99)

## 2012-09-22 LAB — SEDIMENTATION RATE: Sed Rate: 23 mm/hr — ABNORMAL HIGH (ref 0–16)

## 2012-09-22 LAB — CBC WITH DIFFERENTIAL/PLATELET
Basophils Absolute: 0.1 10*3/uL (ref 0.0–0.1)
EOS%: 5.1 % (ref 0.0–7.0)
Eosinophils Absolute: 0.4 10*3/uL (ref 0.0–0.5)
LYMPH%: 24.8 % (ref 14.0–49.0)
MCH: 31.9 pg (ref 27.2–33.4)
MCV: 88.7 fL (ref 79.3–98.0)
MONO%: 9.2 % (ref 0.0–14.0)
NEUT#: 5 10*3/uL (ref 1.5–6.5)
Platelets: 177 10*3/uL (ref 140–400)
RBC: 4.24 10*6/uL (ref 4.20–5.82)
RDW: 13.9 % (ref 11.0–14.6)

## 2012-09-22 MED ORDER — IOHEXOL 300 MG/ML  SOLN
100.0000 mL | Freq: Once | INTRAMUSCULAR | Status: AC | PRN
  Administered 2012-09-22: 100 mL via INTRAVENOUS

## 2012-09-24 ENCOUNTER — Telehealth: Payer: Self-pay | Admitting: *Deleted

## 2012-09-24 NOTE — Telephone Encounter (Signed)
Message copied by Jesse Fall on Thu Sep 24, 2012  2:55 PM ------      Message from: Annia Belt      Created: Wed Sep 23, 2012  5:03 PM       Call pt blood sugar high at 412.  Forward cc to PCP

## 2012-09-24 NOTE — Telephone Encounter (Signed)
Pt. Notified of good CT report & elevated blood sugar & lab result routed to PCP.

## 2012-09-24 NOTE — Telephone Encounter (Signed)
Message copied by Jesse Fall on Thu Sep 24, 2012 12:12 PM ------      Message from: Annia Belt      Created: Wed Sep 23, 2012  5:04 PM       Call pt CT with no evidence for active lymphoma

## 2012-09-24 NOTE — Telephone Encounter (Signed)
Message copied by Jesse Fall on Thu Sep 24, 2012  2:44 PM ------      Message from: Annia Belt      Created: Wed Sep 23, 2012  5:03 PM       Call pt blood sugar high at 412.  Forward cc to PCP

## 2012-09-24 NOTE — Telephone Encounter (Signed)
Message left on pt's identified vm to call back for CT results.

## 2012-09-24 NOTE — Telephone Encounter (Signed)
Message copied by Jesse Fall on Thu Sep 24, 2012  2:20 PM ------      Message from: Annia Belt      Created: Wed Sep 23, 2012  5:03 PM       Call pt blood sugar high at 412.  Forward cc to PCP

## 2012-09-24 NOTE — Telephone Encounter (Signed)
Message copied by Jesse Fall on Thu Sep 24, 2012  2:18 PM ------      Message from: Annia Belt      Created: Wed Sep 23, 2012  5:04 PM       Call pt CT with no evidence for active lymphoma

## 2012-09-29 ENCOUNTER — Ambulatory Visit: Payer: Medicare Other | Admitting: Oncology

## 2012-09-30 ENCOUNTER — Encounter: Payer: Self-pay | Admitting: Oncology
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
## 2012-12-03 ENCOUNTER — Telehealth: Payer: Self-pay | Admitting: *Deleted

## 2012-12-03 NOTE — Telephone Encounter (Signed)
Received call from pt stating that he has a knot on his arm & has seen Family MD & they don't know what it is & suggested he call Dr Beryle Beams.  Note to Dr. Beryle Beams.  Return call [phone].

## 2012-12-04 ENCOUNTER — Ambulatory Visit: Payer: Medicare Other | Admitting: Oncology

## 2012-12-06 ENCOUNTER — Encounter: Payer: Self-pay | Admitting: Oncology

## 2012-12-06 NOTE — Progress Notes (Signed)
52 year old man status post resection of a pulmonary nodule that turned out to be a MALT type lymphoma. He noticed a lump on his arm. He he brought this to the attention of his family physician who told him to call our office. He called yesterday. I put him on as a work in appointment today and he failed to report for the visit.

## 2013-05-31 ENCOUNTER — Telehealth: Payer: Self-pay | Admitting: *Deleted

## 2013-05-31 NOTE — Telephone Encounter (Addendum)
Received vm call from pt asking for return call to discuss symptoms.  Call returned @ 6pm & pt reports not being able to sleep at hs, problems breathing with little exertion, & night sweats.  He doesn't have a f/u appt.  Note to Dr Beryle Beams, Spoke to pt today 9/130/14 & informed per Dr Sid Falcon that this sounds like a cardiac problem but we can get a CXR & EKG & go from there.  Asked if he had a PCP & he reports that he does & will f/u with him & may want to get EKG & CXR closer to home.  He will call back if we need to schedule.

## 2013-06-01 ENCOUNTER — Telehealth: Payer: Self-pay | Admitting: *Deleted

## 2013-10-11 IMAGING — CR DG CHEST 2V
2 series · 2 of 2 positions shown · non-contrast
Comparison: 06/27/2011

CLINICAL DATA: Postop right VATS

CHEST - 2 VIEW

[w chest pa]
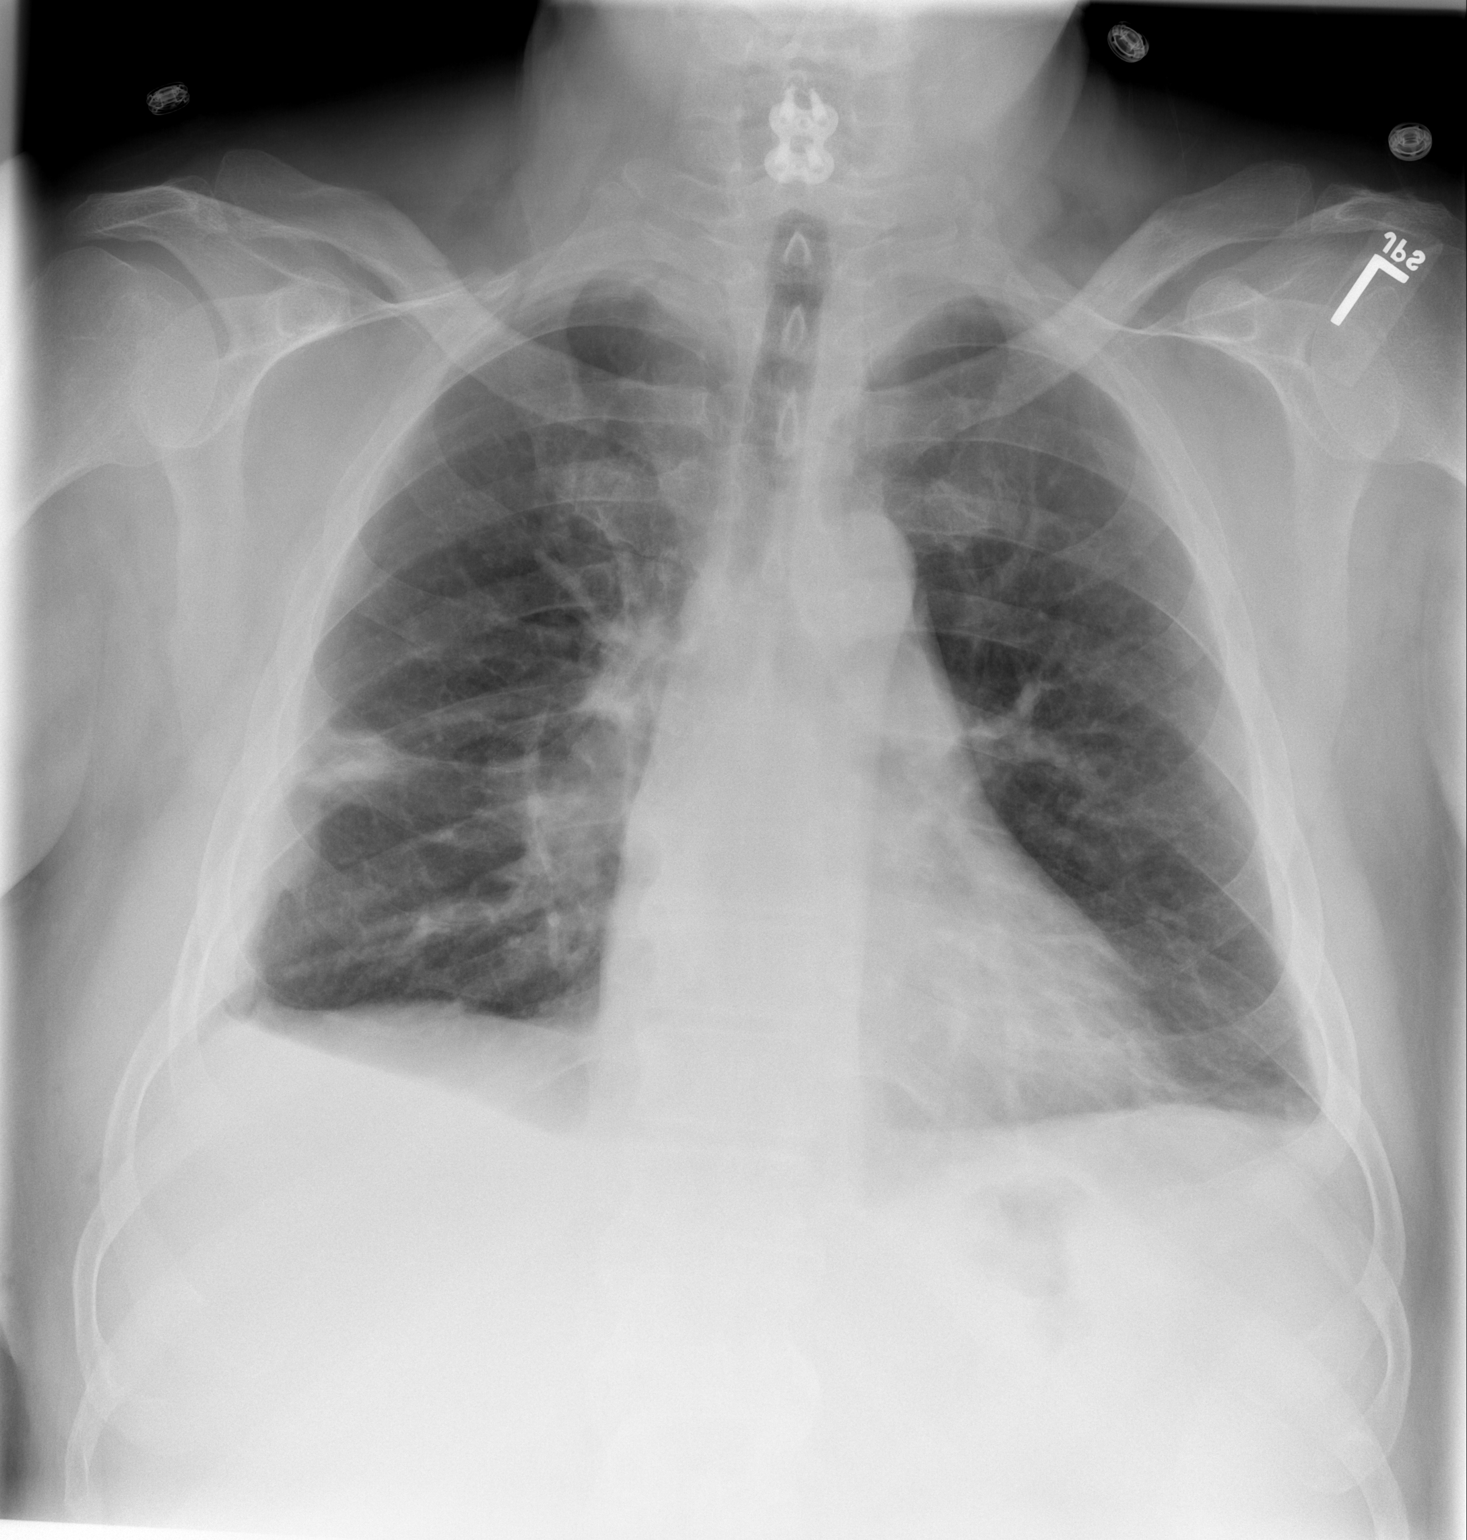

[w chest lat]
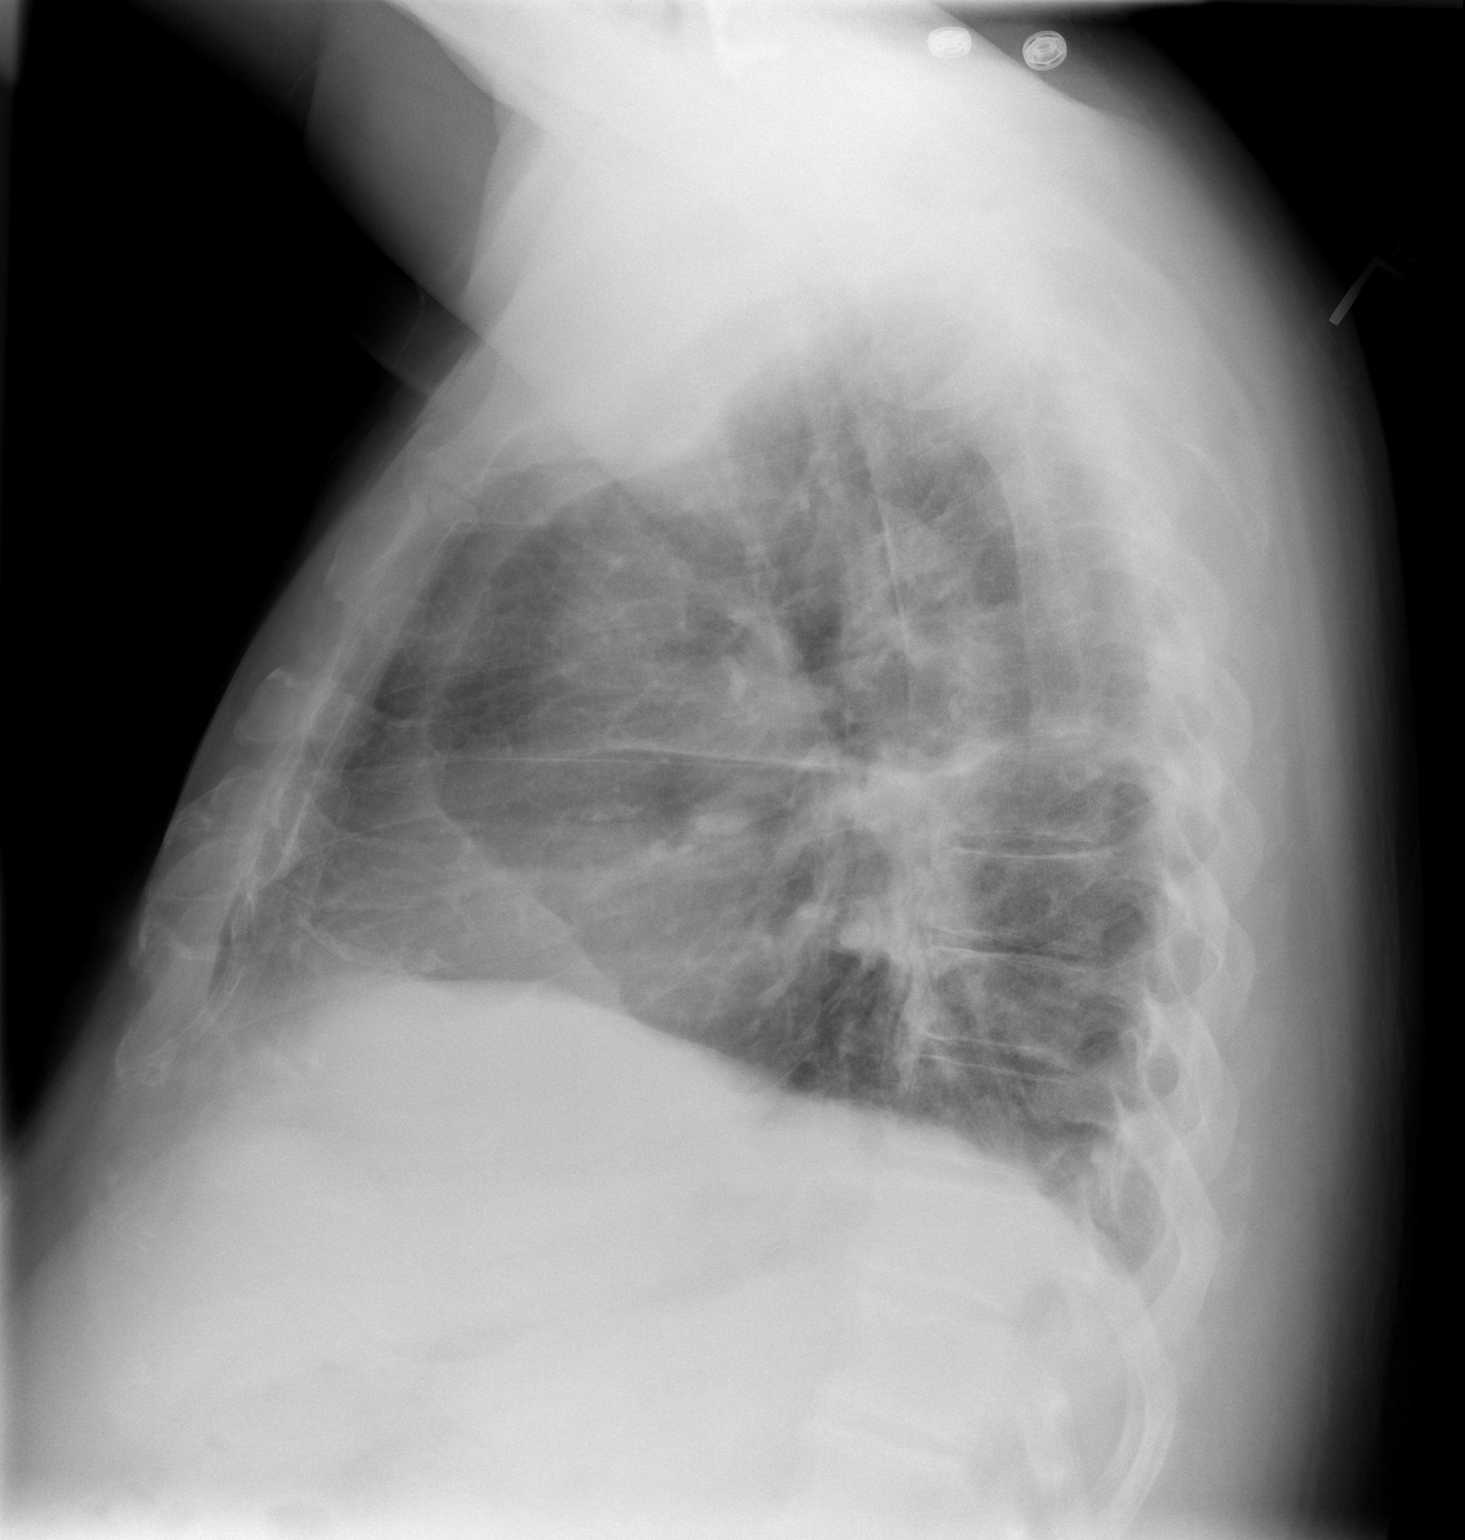

[2 of 2 positions shown; findings below may reference images not displayed]

FINDINGS: Small right hydropneumothorax with stable tiny apical
pneumothorax component.  Mild right sided volume loss.  Small
amount of loculated fluid along the major fissure.

Left lung is essentially clear.

Cardiomediastinal silhouette is within normal limits.

Degenerative changes of the visualized thoracolumbar spine.
Cervical spine fixation hardware.
IMPRESSION: Small right hydropneumothorax with stable tiny apical pneumothorax
component.

## 2013-10-30 ENCOUNTER — Encounter: Payer: Self-pay | Admitting: *Deleted

## 2013-10-30 ENCOUNTER — Telehealth: Payer: Self-pay | Admitting: *Deleted

## 2013-10-30 NOTE — Telephone Encounter (Signed)
Former pt of DR. G...td

## 2013-12-22 ENCOUNTER — Ambulatory Visit (HOSPITAL_BASED_OUTPATIENT_CLINIC_OR_DEPARTMENT_OTHER): Payer: Medicare Other | Admitting: Oncology

## 2013-12-22 ENCOUNTER — Encounter: Payer: Self-pay | Admitting: Oncology

## 2013-12-22 ENCOUNTER — Telehealth: Payer: Self-pay | Admitting: Internal Medicine

## 2013-12-22 VITALS — BP 161/80 | HR 67 | Temp 97.5°F | Resp 19 | Ht 71.5 in | Wt 261.2 lb

## 2013-12-22 DIAGNOSIS — C8582 Other specified types of non-Hodgkin lymphoma, intrathoracic lymph nodes: Secondary | ICD-10-CM

## 2013-12-22 DIAGNOSIS — C8589 Other specified types of non-Hodgkin lymphoma, extranodal and solid organ sites: Secondary | ICD-10-CM

## 2013-12-22 NOTE — Telephone Encounter (Signed)
, °

## 2013-12-22 NOTE — Progress Notes (Signed)
Hematology and Oncology Follow Up Visit  Curtis Mays [id number] August 04, 1961 53 y.o. 12/22/2013 3:21 PM   Principle Diagnosis: 86 or old gentleman diagnosed with non-Hodgkin's lymphoma, marginal zone B cell type after presenting with a solitary right lower lobe nodule in October of 2012.     Prior Therapy: Status post surgical resection and a thoracotomy done in October of 2012.   Current therapy: Observation and surveillance.  Interim History: This is a pleasant 53 year old gentleman he is to be followed by Dr. Beryle Beams for the above diagnosis. Since his last visit, he has no new complaints. He has not reported any lymphadenopathy or hospitalization. Has not reported any chest pain or difficulty breathing. Has not reported any shortness of breath or hemoptysis. Has not reported any pulmonary symptoms. He currently lives close to [employer] and commutes here for these appointments.  Medications: I have reviewed the patient's current medications.  Current Outpatient Prescriptions  Medication Sig Dispense Refill  . aspirin 81 MG tablet Take 81 mg by mouth daily.        Marland Kitchen BAYER CONTOUR TEST test strip       . febuxostat (ULORIC) 40 MG tablet Take 80 mg by mouth daily.       . furosemide (LASIX) 80 MG tablet Take 80 mg by mouth 2 (two) times daily as needed.       . gabapentin (NEURONTIN) 600 MG tablet Take 600 mg by mouth 3 (three) times daily.       . insulin aspart (NOVOLOG) 100 UNIT/ML injection Inject into the skin 3 (three) times daily before meals. 10units per 29 carbs/sliding scale      . insulin glargine (LANTUS) 100 UNIT/ML injection Inject into the skin at bedtime. 80 units in evening      . KLOR-CON M20 20 MEQ tablet Take 20 mEq by mouth 2 (two) times daily as needed. With lasix      . lisinopril (PRINIVIL,ZESTRIL) 40 MG tablet Take 40 mg by mouth daily.        . metFORMIN (GLUCOPHAGE) 1000 MG tablet Take 1,000 mg by mouth 2 (two) times daily with a meal.        . nebivolol  (BYSTOLIC) 10 MG tablet Take 10 mg by mouth daily.        Marland Kitchen oxymorphone (OPANA ER) 20 MG 12 hr tablet Take 20 mg by mouth every 12 (twelve) hours.      . Tamsulosin HCl (FLOMAX) 0.4 MG CAPS Take 0.4 mg by mouth daily.         No current facility-administered medications for this visit.     Allergies: No Known Allergies  Past Medical History, Surgical history, Social history, and Family History were reviewed and updated.  Review of Systems: Constitutional:  Negative for fever, chills, night sweats, anorexia, weight loss, pain. Cardiovascular: no chest pain or dyspnea on exertion Respiratory: no cough, shortness of breath, or wheezing Neurological: no TIA or stroke symptoms Dermatological: negative ENT: negative Skin: Negative. Gastrointestinal: no abdominal pain, change in bowel habits, or black or bloody stools Genito-Urinary: no dysuria, trouble voiding, or hematuria Hematological and Lymphatic: negative for - bleeding problems, blood clots or weight loss Breast: negative Musculoskeletal: negative Remaining ROS negative. Physical Exam: Blood pressure 161/80, pulse 67, temperature 97.5 F (36.4 C), temperature source Oral, resp. rate 19, height 5' 11.5" (1.816 m), weight 261 lb 3.2 oz (118.48 kg). ECOG: 0 General appearance: alert, cooperative and appears stated age Head: Normocephalic, without obvious abnormality, atraumatic Neck:  no adenopathy, no carotid bruit, no JVD, supple, symmetrical, trachea midline and thyroid not enlarged, symmetric, no tenderness/mass/nodules Lymph nodes: Cervical, supraclavicular, and axillary nodes normal. Heart:regular rate and rhythm, S1, S2 normal, no murmur, click, rub or gallop Lung:chest clear, no wheezing, rales, normal symmetric air entry Abdomin: soft, non-tender, without masses or organomegaly EXT:no erythema, induration, or nodules   Lab Results: Lab Results  Component Value Date   WBC 8.4 09/22/2012   HGB 13.5 09/22/2012   HCT  37.7* 09/22/2012   MCV 88.7 09/22/2012   PLT 177 09/22/2012     Chemistry      Component Value Date/Time   NA 138 09/22/2012 1330   NA 140 03/23/2012 0806   NA 134* 08/16/2011 0510   K 4.7 09/22/2012 1330   K 4.7 03/23/2012 0806   K 4.1 08/16/2011 0510   CL 100 09/22/2012 1330   CL 96* 03/23/2012 0806   CL 101 08/16/2011 0510   CO2 28 09/22/2012 1330   CO2 28 03/23/2012 0806   CO2 26 08/16/2011 0510   BUN 27.0* 09/22/2012 1330   BUN 40* 03/23/2012 0806   BUN 24* 08/16/2011 0510   CREATININE 1.4* 09/22/2012 1330   CREATININE 1.3* 03/23/2012 0806   CREATININE 0.90 08/16/2011 0510      Component Value Date/Time   CALCIUM 10.1 09/22/2012 1330   CALCIUM 9.8 03/23/2012 0806   CALCIUM 9.6 08/16/2011 0510   ALKPHOS 67 03/23/2012 0806   ALKPHOS 68 08/12/2011 0200   AST 22 03/23/2012 0806   AST 9 08/12/2011 0200   ALT 34 03/23/2012 0806   ALT 13 08/12/2011 0200   BILITOT 0.80 03/23/2012 0806   BILITOT 1.4* 08/12/2011 0200       Impression and Plan:   53 year old gentleman with the following issues:  1. Non-Hodgkin's lymphoma extranodal diagnosed after a thoracotomy of the right lower lung lobe. There was in 2012 and have been disease free since that time. The plan at this point is to continue with active surveillance with a chest x-ray and laboratory data in 6 months. Will obtain a CT scan of the chest if there is any abnormalities.  2. Followup: Will be in 6 months.   Wyatt Portela, MD 4/22/20153:21 PM

## 2014-06-07 ENCOUNTER — Other Ambulatory Visit: Payer: Medicare Other

## 2014-06-07 ENCOUNTER — Ambulatory Visit: Payer: Medicare Other | Admitting: Oncology

## 2014-08-31 ENCOUNTER — Telehealth: Payer: Self-pay | Admitting: Oncology

## 2014-08-31 NOTE — Telephone Encounter (Signed)
pt called today to r/s missed appt from oct 2015. per pt he moved and lost inf. pt given new appt for lb/fu 09/28/14 and will do cxr the same day prior to lb/fu. cxr order still active.

## 2014-09-06 DIAGNOSIS — N400 Enlarged prostate without lower urinary tract symptoms: Secondary | ICD-10-CM | POA: Diagnosis not present

## 2014-09-06 DIAGNOSIS — G894 Chronic pain syndrome: Secondary | ICD-10-CM | POA: Diagnosis not present

## 2014-09-06 DIAGNOSIS — E1140 Type 2 diabetes mellitus with diabetic neuropathy, unspecified: Secondary | ICD-10-CM | POA: Diagnosis not present

## 2014-09-06 DIAGNOSIS — M5090 Cervical disc disorder, unspecified, unspecified cervical region: Secondary | ICD-10-CM | POA: Diagnosis not present

## 2014-09-06 DIAGNOSIS — Z79899 Other long term (current) drug therapy: Secondary | ICD-10-CM | POA: Diagnosis not present

## 2014-09-28 ENCOUNTER — Ambulatory Visit (HOSPITAL_BASED_OUTPATIENT_CLINIC_OR_DEPARTMENT_OTHER): Payer: 59 | Admitting: Oncology

## 2014-09-28 ENCOUNTER — Other Ambulatory Visit (HOSPITAL_BASED_OUTPATIENT_CLINIC_OR_DEPARTMENT_OTHER): Payer: 59

## 2014-09-28 ENCOUNTER — Ambulatory Visit (HOSPITAL_COMMUNITY)
Admission: RE | Admit: 2014-09-28 | Discharge: 2014-09-28 | Disposition: A | Discharge status: Home / Self Care | Payer: 59 | Source: Ambulatory Visit | Attending: Oncology | Admitting: Oncology

## 2014-09-28 ENCOUNTER — Telehealth: Payer: Self-pay | Admitting: Oncology

## 2014-09-28 VITALS — BP 168/79 | HR 61 | Temp 97.5°F | Resp 18 | Ht 71.5 in | Wt 260.2 lb

## 2014-09-28 DIAGNOSIS — C8599 Non-Hodgkin lymphoma, unspecified, extranodal and solid organ sites: Secondary | ICD-10-CM

## 2014-09-28 DIAGNOSIS — C3490 Malignant neoplasm of unspecified part of unspecified bronchus or lung: Secondary | ICD-10-CM | POA: Diagnosis not present

## 2014-09-28 DIAGNOSIS — I517 Cardiomegaly: Secondary | ICD-10-CM | POA: Diagnosis not present

## 2014-09-28 DIAGNOSIS — C884 Extranodal marginal zone B-cell lymphoma of mucosa-associated lymphoid tissue [MALT-lymphoma]: Secondary | ICD-10-CM

## 2014-09-28 DIAGNOSIS — Z9889 Other specified postprocedural states: Secondary | ICD-10-CM | POA: Insufficient documentation

## 2014-09-28 LAB — CBC WITH DIFFERENTIAL/PLATELET
BASO%: 1 % (ref 0.0–2.0)
Basophils Absolute: 0.1 10*3/uL (ref 0.0–0.1)
EOS%: 4.2 % (ref 0.0–7.0)
Eosinophils Absolute: 0.3 10*3/uL (ref 0.0–0.5)
HEMATOCRIT: 34.1 % — AB (ref 38.4–49.9)
HEMOGLOBIN: 12.2 g/dL — AB (ref 13.0–17.1)
LYMPH%: 25.9 % (ref 14.0–49.0)
MCH: 31.4 pg (ref 27.2–33.4)
MCHC: 35.8 g/dL (ref 32.0–36.0)
MCV: 87.7 fL (ref 79.3–98.0)
MONO#: 0.8 10*3/uL (ref 0.1–0.9)
MONO%: 9.8 % (ref 0.0–14.0)
NEUT#: 4.8 10*3/uL (ref 1.5–6.5)
NEUT%: 59.1 % (ref 39.0–75.0)
PLATELETS: 163 10*3/uL (ref 140–400)
RBC: 3.89 10*6/uL — AB (ref 4.20–5.82)
RDW: 13.7 % (ref 11.0–14.6)
WBC: 8.2 10*3/uL (ref 4.0–10.3)
lymph#: 2.1 10*3/uL (ref 0.9–3.3)

## 2014-09-28 LAB — COMPREHENSIVE METABOLIC PANEL (CC13)
ALBUMIN: 3.3 g/dL — AB (ref 3.5–5.0)
ALT: 19 U/L (ref 0–55)
ANION GAP: 10 meq/L (ref 3–11)
AST: 14 U/L (ref 5–34)
Alkaline Phosphatase: 76 U/L (ref 40–150)
BILIRUBIN TOTAL: 0.55 mg/dL (ref 0.20–1.20)
BUN: 27.1 mg/dL — ABNORMAL HIGH (ref 7.0–26.0)
CO2: 27 meq/L (ref 22–29)
Calcium: 8.7 mg/dL (ref 8.4–10.4)
Chloride: 104 mEq/L (ref 98–109)
Creatinine: 1.4 mg/dL — ABNORMAL HIGH (ref 0.7–1.3)
EGFR: 56 mL/min/{1.73_m2} — ABNORMAL LOW (ref >90)
Glucose: 247 mg/dl — ABNORMAL HIGH (ref 70–140)
Potassium: 4.2 mEq/L (ref 3.5–5.1)
Sodium: 141 mEq/L (ref 136–145)
TOTAL PROTEIN: 6.4 g/dL (ref 6.4–8.3)

## 2014-09-28 NOTE — Telephone Encounter (Signed)
Labs/ov added per 01/27 POF, called pt, # VM is full and not accepting calls, mailed out ltr/cal to pt.... KJ

## 2014-09-28 NOTE — Progress Notes (Signed)
Hematology and Oncology Follow Up Visit  Curtis Mays [id number] Jan 29, 1961 54 y.o. 09/28/2014 2:32 PM   Principle Diagnosis: 24 or old gentleman diagnosed with non-Hodgkin's lymphoma, marginal zone B cell type after presenting with a solitary right lower lobe nodule in October of 2012.     Prior Therapy: Status post surgical resection and a thoracotomy done in October of 2012.   Current therapy: Observation and surveillance.  Interim History: Mr. Curtis Mays presents today for a follow-up visit. Since his last visit, he has no new complaints. He has not reported any lymphadenopathy or hospitalization. Has not reported any chest pain or difficulty breathing. Has not reported any shortness of breath or hemoptysis. Has not reported any pulmonary symptoms. He continues to live close to [employer] and commutes here for these appointments. He does have symptoms of BPH with frequency and nocturia and currently receiving medications for that. He does not report any hematuria or dysuria. Has not reported any musculoskeletal complaints of arthralgias or myalgias. Does not report any leg edema or orthopnea. He does not report any nausea, vomiting, abdominal pain or distention. Rest of his review of systems unremarkable.  Medications: I have reviewed the patient's current medications.  Current Outpatient Prescriptions  Medication Sig Dispense Refill  . aspirin 81 MG tablet Take 81 mg by mouth daily.      Marland Kitchen BAYER CONTOUR TEST test strip     . furosemide (LASIX) 80 MG tablet Take 80 mg by mouth 2 (two) times daily as needed.     . gabapentin (NEURONTIN) 600 MG tablet Take 600 mg by mouth 3 (three) times daily.     . insulin aspart (NOVOLOG) 100 UNIT/ML injection Inject into the skin 3 (three) times daily before meals. 10units per 29 carbs/sliding scale    . insulin glargine (LANTUS) 100 UNIT/ML injection Inject into the skin at bedtime. 80 units in evening    . KLOR-CON M20 20 MEQ tablet Take 20 mEq by mouth  2 (two) times daily as needed. With lasix    . lisinopril (PRINIVIL,ZESTRIL) 40 MG tablet Take 40 mg by mouth daily.      . metFORMIN (GLUCOPHAGE) 1000 MG tablet Take 1,000 mg by mouth 2 (two) times daily with a meal.      . nebivolol (BYSTOLIC) 10 MG tablet Take 10 mg by mouth daily.      Marland Kitchen oxymorphone (OPANA ER) 20 MG 12 hr tablet Take 20 mg by mouth every 12 (twelve) hours.    . Tamsulosin HCl (FLOMAX) 0.4 MG CAPS Take 0.4 mg by mouth daily.       No current facility-administered medications for this visit.     Allergies: No Known Allergies  Past Medical History, Surgical history, Social history, and Family History were reviewed and updated.   Physical Exam: Blood pressure 168/79, pulse 61, temperature 97.5 F (36.4 C), temperature source Oral, resp. rate 18, height 5' 11.5" (1.816 m), weight 260 lb 3.2 oz (118.026 kg). ECOG: 0 General appearance: alert, cooperative and appears stated age Head: Normocephalic, without obvious abnormality, atraumatic Neck: no adenopathy Lymph nodes: Cervical, supraclavicular, and axillary nodes normal. Heart:regular rate and rhythm, S1, S2 normal, no murmur, click, rub or gallop Lung:chest clear, no wheezing, rales, normal symmetric air entry Abdomin: soft, non-tender, without masses or organomegaly EXT:no erythema, induration, or nodules   Lab Results: Lab Results  Component Value Date   WBC 8.2 09/28/2014   HGB 12.2* 09/28/2014   HCT 34.1* 09/28/2014   MCV 87.7  09/28/2014   PLT 163 09/28/2014     Chemistry      Component Value Date/Time   NA 138 09/22/2012 1330   NA 140 03/23/2012 0806   NA 134* 08/16/2011 0510   K 4.7 09/22/2012 1330   K 4.7 03/23/2012 0806   K 4.1 08/16/2011 0510   CL 100 09/22/2012 1330   CL 96* 03/23/2012 0806   CL 101 08/16/2011 0510   CO2 28 09/22/2012 1330   CO2 28 03/23/2012 0806   CO2 26 08/16/2011 0510   BUN 27.0* 09/22/2012 1330   BUN 40* 03/23/2012 0806   BUN 24* 08/16/2011 0510   CREATININE  1.4* 09/22/2012 1330   CREATININE 1.3* 03/23/2012 0806   CREATININE 0.90 08/16/2011 0510      Component Value Date/Time   CALCIUM 10.1 09/22/2012 1330   CALCIUM 9.8 03/23/2012 0806   CALCIUM 9.6 08/16/2011 0510   ALKPHOS 67 03/23/2012 0806   ALKPHOS 68 08/12/2011 0200   AST 22 03/23/2012 0806   AST 9 08/12/2011 0200   ALT 34 03/23/2012 0806   ALT 13 08/12/2011 0200   BILITOT 0.80 03/23/2012 0806   BILITOT 1.4* 08/12/2011 0200       Impression and Plan:   54 year old gentleman with the following issues:  1. Non-Hodgkin's lymphoma extranodal diagnosed after a thoracotomy of the right lower lung lobe. There was in 2012 and have been disease free since that time. His laboratory data and chest x-ray do not reveal any clear-cut malignancy. The official report from his chest x-rays pending and we will certainly review that. The plan at this point is to continue with active surveillance on an annual basis and repeat laboratory testing and chest x-ray at that time.  2. Followup: Will be in 12 months. This will be sooner if there is any abnormalities on the chest x-ray.   [name], MD 1/27/20162:32 PM

## 2014-10-06 DIAGNOSIS — M5090 Cervical disc disorder, unspecified, unspecified cervical region: Secondary | ICD-10-CM | POA: Diagnosis not present

## 2014-10-06 DIAGNOSIS — E1140 Type 2 diabetes mellitus with diabetic neuropathy, unspecified: Secondary | ICD-10-CM | POA: Diagnosis not present

## 2014-10-06 DIAGNOSIS — M25552 Pain in left hip: Secondary | ICD-10-CM | POA: Diagnosis not present

## 2014-10-06 DIAGNOSIS — G894 Chronic pain syndrome: Secondary | ICD-10-CM | POA: Diagnosis not present

## 2014-11-08 DIAGNOSIS — E1140 Type 2 diabetes mellitus with diabetic neuropathy, unspecified: Secondary | ICD-10-CM | POA: Diagnosis not present

## 2014-11-08 DIAGNOSIS — G894 Chronic pain syndrome: Secondary | ICD-10-CM | POA: Diagnosis not present

## 2014-11-08 DIAGNOSIS — M5090 Cervical disc disorder, unspecified, unspecified cervical region: Secondary | ICD-10-CM | POA: Diagnosis not present

## 2014-11-08 DIAGNOSIS — Z79899 Other long term (current) drug therapy: Secondary | ICD-10-CM | POA: Diagnosis not present

## 2014-11-08 DIAGNOSIS — Z6835 Body mass index (BMI) 35.0-35.9, adult: Secondary | ICD-10-CM | POA: Diagnosis not present

## 2014-12-08 DIAGNOSIS — G894 Chronic pain syndrome: Secondary | ICD-10-CM | POA: Diagnosis not present

## 2014-12-08 DIAGNOSIS — Z6835 Body mass index (BMI) 35.0-35.9, adult: Secondary | ICD-10-CM | POA: Diagnosis not present

## 2014-12-08 DIAGNOSIS — M5090 Cervical disc disorder, unspecified, unspecified cervical region: Secondary | ICD-10-CM | POA: Diagnosis not present

## 2014-12-08 DIAGNOSIS — Z79899 Other long term (current) drug therapy: Secondary | ICD-10-CM | POA: Diagnosis not present

## 2015-01-02 DIAGNOSIS — E11329 Type 2 diabetes mellitus with mild nonproliferative diabetic retinopathy without macular edema: Secondary | ICD-10-CM | POA: Diagnosis not present

## 2015-01-09 DIAGNOSIS — G894 Chronic pain syndrome: Secondary | ICD-10-CM | POA: Diagnosis not present

## 2015-01-09 DIAGNOSIS — Z79899 Other long term (current) drug therapy: Secondary | ICD-10-CM | POA: Diagnosis not present

## 2015-01-09 DIAGNOSIS — E041 Nontoxic single thyroid nodule: Secondary | ICD-10-CM | POA: Diagnosis not present

## 2015-01-09 DIAGNOSIS — I10 Essential (primary) hypertension: Secondary | ICD-10-CM | POA: Diagnosis not present

## 2015-01-09 DIAGNOSIS — Z1389 Encounter for screening for other disorder: Secondary | ICD-10-CM | POA: Diagnosis not present

## 2015-01-09 DIAGNOSIS — E785 Hyperlipidemia, unspecified: Secondary | ICD-10-CM | POA: Diagnosis not present

## 2015-01-09 DIAGNOSIS — E1140 Type 2 diabetes mellitus with diabetic neuropathy, unspecified: Secondary | ICD-10-CM | POA: Diagnosis not present

## 2015-02-09 DIAGNOSIS — G894 Chronic pain syndrome: Secondary | ICD-10-CM | POA: Diagnosis not present

## 2015-02-09 DIAGNOSIS — Z6835 Body mass index (BMI) 35.0-35.9, adult: Secondary | ICD-10-CM | POA: Diagnosis not present

## 2015-02-09 DIAGNOSIS — M5090 Cervical disc disorder, unspecified, unspecified cervical region: Secondary | ICD-10-CM | POA: Diagnosis not present

## 2015-02-09 DIAGNOSIS — Z79899 Other long term (current) drug therapy: Secondary | ICD-10-CM | POA: Diagnosis not present

## 2015-03-14 DIAGNOSIS — Z1389 Encounter for screening for other disorder: Secondary | ICD-10-CM | POA: Diagnosis not present

## 2015-03-14 DIAGNOSIS — Z6835 Body mass index (BMI) 35.0-35.9, adult: Secondary | ICD-10-CM | POA: Diagnosis not present

## 2015-03-14 DIAGNOSIS — M5090 Cervical disc disorder, unspecified, unspecified cervical region: Secondary | ICD-10-CM | POA: Diagnosis not present

## 2015-03-14 DIAGNOSIS — G894 Chronic pain syndrome: Secondary | ICD-10-CM | POA: Diagnosis not present

## 2015-03-14 DIAGNOSIS — Z79899 Other long term (current) drug therapy: Secondary | ICD-10-CM | POA: Diagnosis not present

## 2015-04-14 DIAGNOSIS — Z6835 Body mass index (BMI) 35.0-35.9, adult: Secondary | ICD-10-CM | POA: Diagnosis not present

## 2015-04-14 DIAGNOSIS — Z79899 Other long term (current) drug therapy: Secondary | ICD-10-CM | POA: Diagnosis not present

## 2015-04-14 DIAGNOSIS — E1140 Type 2 diabetes mellitus with diabetic neuropathy, unspecified: Secondary | ICD-10-CM | POA: Diagnosis not present

## 2015-04-14 DIAGNOSIS — M5090 Cervical disc disorder, unspecified, unspecified cervical region: Secondary | ICD-10-CM | POA: Diagnosis not present

## 2015-04-14 DIAGNOSIS — G894 Chronic pain syndrome: Secondary | ICD-10-CM | POA: Diagnosis not present

## 2015-05-15 DIAGNOSIS — Z79899 Other long term (current) drug therapy: Secondary | ICD-10-CM | POA: Diagnosis not present

## 2015-05-15 DIAGNOSIS — E1140 Type 2 diabetes mellitus with diabetic neuropathy, unspecified: Secondary | ICD-10-CM | POA: Diagnosis not present

## 2015-05-15 DIAGNOSIS — R3915 Urgency of urination: Secondary | ICD-10-CM | POA: Diagnosis not present

## 2015-05-15 DIAGNOSIS — E041 Nontoxic single thyroid nodule: Secondary | ICD-10-CM | POA: Diagnosis not present

## 2015-05-15 DIAGNOSIS — J449 Chronic obstructive pulmonary disease, unspecified: Secondary | ICD-10-CM | POA: Diagnosis not present

## 2015-05-15 DIAGNOSIS — I10 Essential (primary) hypertension: Secondary | ICD-10-CM | POA: Diagnosis not present

## 2015-05-15 DIAGNOSIS — E785 Hyperlipidemia, unspecified: Secondary | ICD-10-CM | POA: Diagnosis not present

## 2015-06-13 DIAGNOSIS — N3289 Other specified disorders of bladder: Secondary | ICD-10-CM | POA: Diagnosis not present

## 2015-06-13 DIAGNOSIS — M5090 Cervical disc disorder, unspecified, unspecified cervical region: Secondary | ICD-10-CM | POA: Diagnosis not present

## 2015-06-13 DIAGNOSIS — G894 Chronic pain syndrome: Secondary | ICD-10-CM | POA: Diagnosis not present

## 2015-06-13 DIAGNOSIS — Z79899 Other long term (current) drug therapy: Secondary | ICD-10-CM | POA: Diagnosis not present

## 2015-06-13 DIAGNOSIS — Z6836 Body mass index (BMI) 36.0-36.9, adult: Secondary | ICD-10-CM | POA: Diagnosis not present

## 2015-07-03 DIAGNOSIS — R030 Elevated blood-pressure reading, without diagnosis of hypertension: Secondary | ICD-10-CM | POA: Diagnosis not present

## 2015-07-03 DIAGNOSIS — M25562 Pain in left knee: Secondary | ICD-10-CM | POA: Diagnosis not present

## 2015-07-05 DIAGNOSIS — E663 Overweight: Secondary | ICD-10-CM | POA: Diagnosis not present

## 2015-07-05 DIAGNOSIS — S8392XS Sprain of unspecified site of left knee, sequela: Secondary | ICD-10-CM | POA: Diagnosis not present

## 2015-07-05 DIAGNOSIS — Z6836 Body mass index (BMI) 36.0-36.9, adult: Secondary | ICD-10-CM | POA: Diagnosis not present

## 2015-07-13 DIAGNOSIS — G894 Chronic pain syndrome: Secondary | ICD-10-CM | POA: Diagnosis not present

## 2015-07-13 DIAGNOSIS — Z6836 Body mass index (BMI) 36.0-36.9, adult: Secondary | ICD-10-CM | POA: Diagnosis not present

## 2015-07-13 DIAGNOSIS — E663 Overweight: Secondary | ICD-10-CM | POA: Diagnosis not present

## 2015-07-13 DIAGNOSIS — M5090 Cervical disc disorder, unspecified, unspecified cervical region: Secondary | ICD-10-CM | POA: Diagnosis not present

## 2015-08-11 DIAGNOSIS — Z6836 Body mass index (BMI) 36.0-36.9, adult: Secondary | ICD-10-CM | POA: Diagnosis not present

## 2015-08-11 DIAGNOSIS — G894 Chronic pain syndrome: Secondary | ICD-10-CM | POA: Diagnosis not present

## 2015-08-11 DIAGNOSIS — E669 Obesity, unspecified: Secondary | ICD-10-CM | POA: Diagnosis not present

## 2015-08-11 DIAGNOSIS — M5090 Cervical disc disorder, unspecified, unspecified cervical region: Secondary | ICD-10-CM | POA: Diagnosis not present

## 2015-08-11 DIAGNOSIS — Z79899 Other long term (current) drug therapy: Secondary | ICD-10-CM | POA: Diagnosis not present

## 2015-09-18 DIAGNOSIS — L918 Other hypertrophic disorders of the skin: Secondary | ICD-10-CM | POA: Diagnosis not present

## 2015-09-18 DIAGNOSIS — M5090 Cervical disc disorder, unspecified, unspecified cervical region: Secondary | ICD-10-CM | POA: Diagnosis not present

## 2015-09-18 DIAGNOSIS — Z79899 Other long term (current) drug therapy: Secondary | ICD-10-CM | POA: Diagnosis not present

## 2015-09-18 DIAGNOSIS — G894 Chronic pain syndrome: Secondary | ICD-10-CM | POA: Diagnosis not present

## 2015-09-27 ENCOUNTER — Other Ambulatory Visit: Payer: 59

## 2015-09-27 ENCOUNTER — Ambulatory Visit: Payer: 59 | Admitting: Oncology

## 2015-10-06 DIAGNOSIS — B079 Viral wart, unspecified: Secondary | ICD-10-CM | POA: Diagnosis not present

## 2015-10-06 DIAGNOSIS — L578 Other skin changes due to chronic exposure to nonionizing radiation: Secondary | ICD-10-CM | POA: Diagnosis not present

## 2015-10-06 DIAGNOSIS — L570 Actinic keratosis: Secondary | ICD-10-CM | POA: Diagnosis not present

## 2015-10-06 DIAGNOSIS — L821 Other seborrheic keratosis: Secondary | ICD-10-CM | POA: Diagnosis not present

## 2015-10-19 DIAGNOSIS — M5090 Cervical disc disorder, unspecified, unspecified cervical region: Secondary | ICD-10-CM | POA: Diagnosis not present

## 2015-10-19 DIAGNOSIS — Z79899 Other long term (current) drug therapy: Secondary | ICD-10-CM | POA: Diagnosis not present

## 2015-10-19 DIAGNOSIS — G894 Chronic pain syndrome: Secondary | ICD-10-CM | POA: Diagnosis not present

## 2015-11-21 DIAGNOSIS — M109 Gout, unspecified: Secondary | ICD-10-CM | POA: Diagnosis not present

## 2015-11-21 DIAGNOSIS — Z79899 Other long term (current) drug therapy: Secondary | ICD-10-CM | POA: Diagnosis not present

## 2015-11-21 DIAGNOSIS — M5090 Cervical disc disorder, unspecified, unspecified cervical region: Secondary | ICD-10-CM | POA: Diagnosis not present

## 2015-11-21 DIAGNOSIS — G894 Chronic pain syndrome: Secondary | ICD-10-CM | POA: Diagnosis not present

## 2015-12-21 DIAGNOSIS — G894 Chronic pain syndrome: Secondary | ICD-10-CM | POA: Diagnosis not present

## 2015-12-21 DIAGNOSIS — Z79899 Other long term (current) drug therapy: Secondary | ICD-10-CM | POA: Diagnosis not present

## 2015-12-21 DIAGNOSIS — Z6836 Body mass index (BMI) 36.0-36.9, adult: Secondary | ICD-10-CM | POA: Diagnosis not present

## 2015-12-21 DIAGNOSIS — M5090 Cervical disc disorder, unspecified, unspecified cervical region: Secondary | ICD-10-CM | POA: Diagnosis not present

## 2016-02-05 DIAGNOSIS — Z79899 Other long term (current) drug therapy: Secondary | ICD-10-CM | POA: Diagnosis not present

## 2016-02-05 DIAGNOSIS — J449 Chronic obstructive pulmonary disease, unspecified: Secondary | ICD-10-CM | POA: Diagnosis not present

## 2016-02-05 DIAGNOSIS — I10 Essential (primary) hypertension: Secondary | ICD-10-CM | POA: Diagnosis not present

## 2016-02-05 DIAGNOSIS — E785 Hyperlipidemia, unspecified: Secondary | ICD-10-CM | POA: Diagnosis not present

## 2016-02-05 DIAGNOSIS — E1140 Type 2 diabetes mellitus with diabetic neuropathy, unspecified: Secondary | ICD-10-CM | POA: Diagnosis not present

## 2016-03-11 DIAGNOSIS — M5090 Cervical disc disorder, unspecified, unspecified cervical region: Secondary | ICD-10-CM | POA: Diagnosis not present

## 2016-03-11 DIAGNOSIS — G894 Chronic pain syndrome: Secondary | ICD-10-CM | POA: Diagnosis not present

## 2016-03-11 DIAGNOSIS — Z79899 Other long term (current) drug therapy: Secondary | ICD-10-CM | POA: Diagnosis not present

## 2016-04-10 DIAGNOSIS — G894 Chronic pain syndrome: Secondary | ICD-10-CM | POA: Diagnosis not present

## 2016-04-10 DIAGNOSIS — Z79899 Other long term (current) drug therapy: Secondary | ICD-10-CM | POA: Diagnosis not present

## 2016-04-10 DIAGNOSIS — M5090 Cervical disc disorder, unspecified, unspecified cervical region: Secondary | ICD-10-CM | POA: Diagnosis not present

## 2016-05-13 DIAGNOSIS — M5090 Cervical disc disorder, unspecified, unspecified cervical region: Secondary | ICD-10-CM | POA: Diagnosis not present

## 2016-05-13 DIAGNOSIS — G894 Chronic pain syndrome: Secondary | ICD-10-CM | POA: Diagnosis not present

## 2016-05-13 DIAGNOSIS — Z79899 Other long term (current) drug therapy: Secondary | ICD-10-CM | POA: Diagnosis not present

## 2016-06-12 DIAGNOSIS — E1140 Type 2 diabetes mellitus with diabetic neuropathy, unspecified: Secondary | ICD-10-CM | POA: Diagnosis not present

## 2016-06-12 DIAGNOSIS — Z1389 Encounter for screening for other disorder: Secondary | ICD-10-CM | POA: Diagnosis not present

## 2016-06-12 DIAGNOSIS — E785 Hyperlipidemia, unspecified: Secondary | ICD-10-CM | POA: Diagnosis not present

## 2016-06-12 DIAGNOSIS — Z79899 Other long term (current) drug therapy: Secondary | ICD-10-CM | POA: Diagnosis not present

## 2016-06-12 DIAGNOSIS — I10 Essential (primary) hypertension: Secondary | ICD-10-CM | POA: Diagnosis not present

## 2016-06-12 DIAGNOSIS — J449 Chronic obstructive pulmonary disease, unspecified: Secondary | ICD-10-CM | POA: Diagnosis not present

## 2016-07-11 DIAGNOSIS — M5090 Cervical disc disorder, unspecified, unspecified cervical region: Secondary | ICD-10-CM | POA: Diagnosis not present

## 2016-07-11 DIAGNOSIS — Z79899 Other long term (current) drug therapy: Secondary | ICD-10-CM | POA: Diagnosis not present

## 2016-07-11 DIAGNOSIS — G894 Chronic pain syndrome: Secondary | ICD-10-CM | POA: Diagnosis not present

## 2016-08-05 DIAGNOSIS — Z79899 Other long term (current) drug therapy: Secondary | ICD-10-CM | POA: Diagnosis not present

## 2016-08-05 DIAGNOSIS — M5090 Cervical disc disorder, unspecified, unspecified cervical region: Secondary | ICD-10-CM | POA: Diagnosis not present

## 2016-08-05 DIAGNOSIS — M109 Gout, unspecified: Secondary | ICD-10-CM | POA: Diagnosis not present

## 2016-08-05 DIAGNOSIS — G894 Chronic pain syndrome: Secondary | ICD-10-CM | POA: Diagnosis not present

## 2016-09-10 DIAGNOSIS — I10 Essential (primary) hypertension: Secondary | ICD-10-CM | POA: Diagnosis not present

## 2016-09-10 DIAGNOSIS — Z1389 Encounter for screening for other disorder: Secondary | ICD-10-CM | POA: Diagnosis not present

## 2016-09-10 DIAGNOSIS — E785 Hyperlipidemia, unspecified: Secondary | ICD-10-CM | POA: Diagnosis not present

## 2016-09-10 DIAGNOSIS — E1165 Type 2 diabetes mellitus with hyperglycemia: Secondary | ICD-10-CM | POA: Diagnosis not present

## 2016-09-10 DIAGNOSIS — E039 Hypothyroidism, unspecified: Secondary | ICD-10-CM | POA: Diagnosis not present

## 2016-10-14 DIAGNOSIS — J449 Chronic obstructive pulmonary disease, unspecified: Secondary | ICD-10-CM | POA: Diagnosis not present

## 2016-10-14 DIAGNOSIS — E785 Hyperlipidemia, unspecified: Secondary | ICD-10-CM | POA: Diagnosis not present

## 2016-10-14 DIAGNOSIS — I10 Essential (primary) hypertension: Secondary | ICD-10-CM | POA: Diagnosis not present

## 2016-10-14 DIAGNOSIS — E041 Nontoxic single thyroid nodule: Secondary | ICD-10-CM | POA: Diagnosis not present

## 2016-10-14 DIAGNOSIS — E1140 Type 2 diabetes mellitus with diabetic neuropathy, unspecified: Secondary | ICD-10-CM | POA: Diagnosis not present

## 2016-11-11 DIAGNOSIS — E1140 Type 2 diabetes mellitus with diabetic neuropathy, unspecified: Secondary | ICD-10-CM | POA: Diagnosis not present

## 2016-11-11 DIAGNOSIS — E041 Nontoxic single thyroid nodule: Secondary | ICD-10-CM | POA: Diagnosis not present

## 2016-11-11 DIAGNOSIS — E785 Hyperlipidemia, unspecified: Secondary | ICD-10-CM | POA: Diagnosis not present

## 2016-11-11 DIAGNOSIS — J449 Chronic obstructive pulmonary disease, unspecified: Secondary | ICD-10-CM | POA: Diagnosis not present

## 2016-11-11 DIAGNOSIS — I10 Essential (primary) hypertension: Secondary | ICD-10-CM | POA: Diagnosis not present

## 2016-12-25 DIAGNOSIS — Z79899 Other long term (current) drug therapy: Secondary | ICD-10-CM | POA: Diagnosis not present

## 2016-12-25 DIAGNOSIS — Z8673 Personal history of transient ischemic attack (TIA), and cerebral infarction without residual deficits: Secondary | ICD-10-CM | POA: Diagnosis not present

## 2016-12-25 DIAGNOSIS — I4891 Unspecified atrial fibrillation: Secondary | ICD-10-CM | POA: Diagnosis not present

## 2016-12-25 DIAGNOSIS — E1122 Type 2 diabetes mellitus with diabetic chronic kidney disease: Secondary | ICD-10-CM | POA: Diagnosis not present

## 2016-12-25 DIAGNOSIS — R109 Unspecified abdominal pain: Secondary | ICD-10-CM | POA: Diagnosis not present

## 2016-12-25 DIAGNOSIS — Z8572 Personal history of non-Hodgkin lymphomas: Secondary | ICD-10-CM | POA: Diagnosis not present

## 2016-12-25 DIAGNOSIS — R079 Chest pain, unspecified: Secondary | ICD-10-CM | POA: Diagnosis not present

## 2016-12-25 DIAGNOSIS — Z79891 Long term (current) use of opiate analgesic: Secondary | ICD-10-CM | POA: Diagnosis not present

## 2016-12-25 DIAGNOSIS — I129 Hypertensive chronic kidney disease with stage 1 through stage 4 chronic kidney disease, or unspecified chronic kidney disease: Secondary | ICD-10-CM | POA: Diagnosis not present

## 2016-12-25 DIAGNOSIS — R0602 Shortness of breath: Secondary | ICD-10-CM | POA: Diagnosis not present

## 2016-12-25 DIAGNOSIS — M109 Gout, unspecified: Secondary | ICD-10-CM | POA: Diagnosis not present

## 2016-12-25 DIAGNOSIS — Z794 Long term (current) use of insulin: Secondary | ICD-10-CM | POA: Diagnosis not present

## 2016-12-25 DIAGNOSIS — M5090 Cervical disc disorder, unspecified, unspecified cervical region: Secondary | ICD-10-CM | POA: Diagnosis not present

## 2016-12-25 DIAGNOSIS — N189 Chronic kidney disease, unspecified: Secondary | ICD-10-CM | POA: Diagnosis not present

## 2016-12-25 DIAGNOSIS — G894 Chronic pain syndrome: Secondary | ICD-10-CM | POA: Diagnosis not present

## 2016-12-26 DIAGNOSIS — R9431 Abnormal electrocardiogram [ECG] [EKG]: Secondary | ICD-10-CM | POA: Diagnosis not present

## 2016-12-26 DIAGNOSIS — N189 Chronic kidney disease, unspecified: Secondary | ICD-10-CM | POA: Diagnosis not present

## 2016-12-26 DIAGNOSIS — R079 Chest pain, unspecified: Secondary | ICD-10-CM | POA: Diagnosis not present

## 2016-12-26 DIAGNOSIS — I4891 Unspecified atrial fibrillation: Secondary | ICD-10-CM | POA: Diagnosis not present

## 2017-01-01 DIAGNOSIS — I4891 Unspecified atrial fibrillation: Secondary | ICD-10-CM | POA: Diagnosis not present

## 2017-01-01 DIAGNOSIS — Z09 Encounter for follow-up examination after completed treatment for conditions other than malignant neoplasm: Secondary | ICD-10-CM | POA: Diagnosis not present

## 2017-01-01 DIAGNOSIS — Z79899 Other long term (current) drug therapy: Secondary | ICD-10-CM | POA: Diagnosis not present

## 2017-01-12 DIAGNOSIS — R072 Precordial pain: Secondary | ICD-10-CM | POA: Insufficient documentation

## 2017-01-13 DIAGNOSIS — R0789 Other chest pain: Secondary | ICD-10-CM | POA: Diagnosis not present

## 2017-01-13 DIAGNOSIS — I10 Essential (primary) hypertension: Secondary | ICD-10-CM | POA: Diagnosis not present

## 2017-01-13 DIAGNOSIS — E1122 Type 2 diabetes mellitus with diabetic chronic kidney disease: Secondary | ICD-10-CM | POA: Diagnosis not present

## 2017-01-13 DIAGNOSIS — I480 Paroxysmal atrial fibrillation: Secondary | ICD-10-CM | POA: Diagnosis not present

## 2017-01-13 DIAGNOSIS — Z8679 Personal history of other diseases of the circulatory system: Secondary | ICD-10-CM | POA: Insufficient documentation

## 2017-01-13 DIAGNOSIS — R072 Precordial pain: Secondary | ICD-10-CM | POA: Diagnosis not present

## 2017-01-29 ENCOUNTER — Telehealth: Payer: Self-pay | Admitting: Oncology

## 2017-01-29 NOTE — Telephone Encounter (Signed)
Unable to reach pt to confirm 6/21 appt. Mailed letter 5/30

## 2017-02-20 ENCOUNTER — Ambulatory Visit: Payer: Medicare Other | Admitting: Oncology

## 2017-02-26 ENCOUNTER — Ambulatory Visit: Payer: Medicare Other | Admitting: Oncology

## 2017-03-13 DIAGNOSIS — I10 Essential (primary) hypertension: Secondary | ICD-10-CM | POA: Diagnosis not present

## 2017-03-13 DIAGNOSIS — J449 Chronic obstructive pulmonary disease, unspecified: Secondary | ICD-10-CM | POA: Diagnosis not present

## 2017-03-13 DIAGNOSIS — E041 Nontoxic single thyroid nodule: Secondary | ICD-10-CM | POA: Diagnosis not present

## 2017-03-13 DIAGNOSIS — E1140 Type 2 diabetes mellitus with diabetic neuropathy, unspecified: Secondary | ICD-10-CM | POA: Diagnosis not present

## 2017-03-13 DIAGNOSIS — E785 Hyperlipidemia, unspecified: Secondary | ICD-10-CM | POA: Diagnosis not present

## 2017-03-18 DIAGNOSIS — Z902 Acquired absence of lung [part of]: Secondary | ICD-10-CM | POA: Diagnosis not present

## 2017-03-18 DIAGNOSIS — Z7901 Long term (current) use of anticoagulants: Secondary | ICD-10-CM | POA: Diagnosis not present

## 2017-03-18 DIAGNOSIS — Z794 Long term (current) use of insulin: Secondary | ICD-10-CM | POA: Diagnosis not present

## 2017-03-18 DIAGNOSIS — C8590 Non-Hodgkin lymphoma, unspecified, unspecified site: Secondary | ICD-10-CM | POA: Diagnosis not present

## 2017-03-18 DIAGNOSIS — Z87891 Personal history of nicotine dependence: Secondary | ICD-10-CM | POA: Diagnosis not present

## 2017-03-18 DIAGNOSIS — N183 Chronic kidney disease, stage 3 (moderate): Secondary | ICD-10-CM | POA: Diagnosis not present

## 2017-03-18 DIAGNOSIS — I110 Hypertensive heart disease with heart failure: Secondary | ICD-10-CM | POA: Diagnosis not present

## 2017-03-18 DIAGNOSIS — I5021 Acute systolic (congestive) heart failure: Secondary | ICD-10-CM | POA: Diagnosis not present

## 2017-03-18 DIAGNOSIS — K7460 Unspecified cirrhosis of liver: Secondary | ICD-10-CM | POA: Insufficient documentation

## 2017-03-18 DIAGNOSIS — Z79899 Other long term (current) drug therapy: Secondary | ICD-10-CM | POA: Diagnosis not present

## 2017-03-18 DIAGNOSIS — E1122 Type 2 diabetes mellitus with diabetic chronic kidney disease: Secondary | ICD-10-CM | POA: Diagnosis not present

## 2017-03-18 DIAGNOSIS — E870 Hyperosmolality and hypernatremia: Secondary | ICD-10-CM | POA: Diagnosis not present

## 2017-03-18 DIAGNOSIS — I481 Persistent atrial fibrillation: Secondary | ICD-10-CM | POA: Diagnosis not present

## 2017-03-18 DIAGNOSIS — R0781 Pleurodynia: Secondary | ICD-10-CM | POA: Diagnosis not present

## 2017-03-18 DIAGNOSIS — I10 Essential (primary) hypertension: Secondary | ICD-10-CM | POA: Diagnosis not present

## 2017-03-18 DIAGNOSIS — Z7984 Long term (current) use of oral hypoglycemic drugs: Secondary | ICD-10-CM | POA: Diagnosis not present

## 2017-03-18 DIAGNOSIS — J449 Chronic obstructive pulmonary disease, unspecified: Secondary | ICD-10-CM | POA: Diagnosis not present

## 2017-03-18 DIAGNOSIS — I129 Hypertensive chronic kidney disease with stage 1 through stage 4 chronic kidney disease, or unspecified chronic kidney disease: Secondary | ICD-10-CM | POA: Diagnosis not present

## 2017-03-18 DIAGNOSIS — I5023 Acute on chronic systolic (congestive) heart failure: Secondary | ICD-10-CM | POA: Diagnosis not present

## 2017-03-18 DIAGNOSIS — R0602 Shortness of breath: Secondary | ICD-10-CM | POA: Diagnosis not present

## 2017-03-18 DIAGNOSIS — I509 Heart failure, unspecified: Secondary | ICD-10-CM | POA: Diagnosis not present

## 2017-03-18 DIAGNOSIS — I130 Hypertensive heart and chronic kidney disease with heart failure and stage 1 through stage 4 chronic kidney disease, or unspecified chronic kidney disease: Secondary | ICD-10-CM | POA: Diagnosis not present

## 2017-03-18 DIAGNOSIS — R740 Nonspecific elevation of levels of transaminase and lactic acid dehydrogenase [LDH]: Secondary | ICD-10-CM | POA: Diagnosis not present

## 2017-03-19 DIAGNOSIS — E1122 Type 2 diabetes mellitus with diabetic chronic kidney disease: Secondary | ICD-10-CM | POA: Diagnosis not present

## 2017-03-19 DIAGNOSIS — I130 Hypertensive heart and chronic kidney disease with heart failure and stage 1 through stage 4 chronic kidney disease, or unspecified chronic kidney disease: Secondary | ICD-10-CM | POA: Diagnosis not present

## 2017-03-19 DIAGNOSIS — I5023 Acute on chronic systolic (congestive) heart failure: Secondary | ICD-10-CM | POA: Diagnosis not present

## 2017-03-19 DIAGNOSIS — K7460 Unspecified cirrhosis of liver: Secondary | ICD-10-CM | POA: Diagnosis not present

## 2017-03-19 DIAGNOSIS — I10 Essential (primary) hypertension: Secondary | ICD-10-CM | POA: Diagnosis not present

## 2017-03-19 DIAGNOSIS — I481 Persistent atrial fibrillation: Secondary | ICD-10-CM | POA: Diagnosis not present

## 2017-03-19 DIAGNOSIS — R748 Abnormal levels of other serum enzymes: Secondary | ICD-10-CM | POA: Diagnosis not present

## 2017-03-19 DIAGNOSIS — N183 Chronic kidney disease, stage 3 (moderate): Secondary | ICD-10-CM | POA: Diagnosis not present

## 2017-03-19 DIAGNOSIS — I5021 Acute systolic (congestive) heart failure: Secondary | ICD-10-CM | POA: Diagnosis not present

## 2017-04-02 DIAGNOSIS — E1122 Type 2 diabetes mellitus with diabetic chronic kidney disease: Secondary | ICD-10-CM | POA: Diagnosis not present

## 2017-04-02 DIAGNOSIS — I5022 Chronic systolic (congestive) heart failure: Secondary | ICD-10-CM | POA: Diagnosis not present

## 2017-04-02 DIAGNOSIS — N183 Chronic kidney disease, stage 3 (moderate): Secondary | ICD-10-CM | POA: Diagnosis not present

## 2017-04-02 DIAGNOSIS — I10 Essential (primary) hypertension: Secondary | ICD-10-CM | POA: Diagnosis not present

## 2017-04-02 DIAGNOSIS — I480 Paroxysmal atrial fibrillation: Secondary | ICD-10-CM | POA: Diagnosis not present

## 2017-04-08 DIAGNOSIS — Z7901 Long term (current) use of anticoagulants: Secondary | ICD-10-CM | POA: Diagnosis not present

## 2017-04-08 DIAGNOSIS — Z794 Long term (current) use of insulin: Secondary | ICD-10-CM | POA: Diagnosis not present

## 2017-04-08 DIAGNOSIS — J449 Chronic obstructive pulmonary disease, unspecified: Secondary | ICD-10-CM | POA: Diagnosis not present

## 2017-04-08 DIAGNOSIS — Z79899 Other long term (current) drug therapy: Secondary | ICD-10-CM | POA: Diagnosis not present

## 2017-04-08 DIAGNOSIS — I130 Hypertensive heart and chronic kidney disease with heart failure and stage 1 through stage 4 chronic kidney disease, or unspecified chronic kidney disease: Secondary | ICD-10-CM | POA: Diagnosis not present

## 2017-04-08 DIAGNOSIS — Z87891 Personal history of nicotine dependence: Secondary | ICD-10-CM | POA: Diagnosis not present

## 2017-04-08 DIAGNOSIS — I480 Paroxysmal atrial fibrillation: Secondary | ICD-10-CM | POA: Diagnosis not present

## 2017-04-08 DIAGNOSIS — C8590 Non-Hodgkin lymphoma, unspecified, unspecified site: Secondary | ICD-10-CM | POA: Diagnosis not present

## 2017-04-08 DIAGNOSIS — I5020 Unspecified systolic (congestive) heart failure: Secondary | ICD-10-CM | POA: Diagnosis not present

## 2017-04-08 DIAGNOSIS — I491 Atrial premature depolarization: Secondary | ICD-10-CM | POA: Diagnosis not present

## 2017-04-08 DIAGNOSIS — N183 Chronic kidney disease, stage 3 (moderate): Secondary | ICD-10-CM | POA: Diagnosis not present

## 2017-04-08 DIAGNOSIS — E1122 Type 2 diabetes mellitus with diabetic chronic kidney disease: Secondary | ICD-10-CM | POA: Diagnosis not present

## 2017-04-22 DIAGNOSIS — R05 Cough: Secondary | ICD-10-CM | POA: Diagnosis not present

## 2017-05-02 DIAGNOSIS — E1122 Type 2 diabetes mellitus with diabetic chronic kidney disease: Secondary | ICD-10-CM | POA: Diagnosis not present

## 2017-05-02 DIAGNOSIS — I480 Paroxysmal atrial fibrillation: Secondary | ICD-10-CM | POA: Diagnosis not present

## 2017-05-02 DIAGNOSIS — I10 Essential (primary) hypertension: Secondary | ICD-10-CM | POA: Diagnosis not present

## 2017-05-02 DIAGNOSIS — I5022 Chronic systolic (congestive) heart failure: Secondary | ICD-10-CM | POA: Diagnosis not present

## 2017-05-02 DIAGNOSIS — K7460 Unspecified cirrhosis of liver: Secondary | ICD-10-CM | POA: Diagnosis not present

## 2017-05-12 DIAGNOSIS — I5022 Chronic systolic (congestive) heart failure: Secondary | ICD-10-CM | POA: Diagnosis not present

## 2017-05-28 DIAGNOSIS — R001 Bradycardia, unspecified: Secondary | ICD-10-CM | POA: Diagnosis not present

## 2017-05-28 DIAGNOSIS — R008 Other abnormalities of heart beat: Secondary | ICD-10-CM | POA: Diagnosis not present

## 2017-05-28 DIAGNOSIS — Z9889 Other specified postprocedural states: Secondary | ICD-10-CM | POA: Diagnosis not present

## 2017-05-28 DIAGNOSIS — I4581 Long QT syndrome: Secondary | ICD-10-CM | POA: Diagnosis not present

## 2017-05-28 DIAGNOSIS — R9431 Abnormal electrocardiogram [ECG] [EKG]: Secondary | ICD-10-CM | POA: Diagnosis not present

## 2017-05-28 DIAGNOSIS — R531 Weakness: Secondary | ICD-10-CM | POA: Diagnosis not present

## 2017-05-28 DIAGNOSIS — I10 Essential (primary) hypertension: Secondary | ICD-10-CM | POA: Diagnosis not present

## 2017-05-28 DIAGNOSIS — I493 Ventricular premature depolarization: Secondary | ICD-10-CM | POA: Diagnosis not present

## 2017-05-28 DIAGNOSIS — R61 Generalized hyperhidrosis: Secondary | ICD-10-CM | POA: Diagnosis not present

## 2017-05-28 DIAGNOSIS — Z794 Long term (current) use of insulin: Secondary | ICD-10-CM | POA: Diagnosis not present

## 2017-05-28 DIAGNOSIS — E1010 Type 1 diabetes mellitus with ketoacidosis without coma: Secondary | ICD-10-CM | POA: Diagnosis not present

## 2017-05-28 DIAGNOSIS — E10649 Type 1 diabetes mellitus with hypoglycemia without coma: Secondary | ICD-10-CM | POA: Diagnosis not present

## 2017-05-28 DIAGNOSIS — R410 Disorientation, unspecified: Secondary | ICD-10-CM | POA: Diagnosis not present

## 2017-05-29 DIAGNOSIS — R9431 Abnormal electrocardiogram [ECG] [EKG]: Secondary | ICD-10-CM | POA: Diagnosis not present

## 2017-05-29 DIAGNOSIS — R001 Bradycardia, unspecified: Secondary | ICD-10-CM | POA: Diagnosis not present

## 2017-06-04 DIAGNOSIS — E1140 Type 2 diabetes mellitus with diabetic neuropathy, unspecified: Secondary | ICD-10-CM | POA: Diagnosis not present

## 2017-06-04 DIAGNOSIS — Z79899 Other long term (current) drug therapy: Secondary | ICD-10-CM | POA: Diagnosis not present

## 2017-06-04 DIAGNOSIS — G4700 Insomnia, unspecified: Secondary | ICD-10-CM | POA: Diagnosis not present

## 2017-06-04 DIAGNOSIS — E162 Hypoglycemia, unspecified: Secondary | ICD-10-CM | POA: Diagnosis not present

## 2017-07-14 DIAGNOSIS — I10 Essential (primary) hypertension: Secondary | ICD-10-CM | POA: Diagnosis not present

## 2017-07-14 DIAGNOSIS — E1140 Type 2 diabetes mellitus with diabetic neuropathy, unspecified: Secondary | ICD-10-CM | POA: Diagnosis not present

## 2017-07-14 DIAGNOSIS — J449 Chronic obstructive pulmonary disease, unspecified: Secondary | ICD-10-CM | POA: Diagnosis not present

## 2017-07-14 DIAGNOSIS — E785 Hyperlipidemia, unspecified: Secondary | ICD-10-CM | POA: Diagnosis not present

## 2017-07-14 DIAGNOSIS — M7989 Other specified soft tissue disorders: Secondary | ICD-10-CM | POA: Diagnosis not present

## 2017-07-14 DIAGNOSIS — M25422 Effusion, left elbow: Secondary | ICD-10-CM | POA: Diagnosis not present

## 2017-07-14 DIAGNOSIS — E041 Nontoxic single thyroid nodule: Secondary | ICD-10-CM | POA: Diagnosis not present

## 2017-07-14 DIAGNOSIS — M25522 Pain in left elbow: Secondary | ICD-10-CM | POA: Diagnosis not present

## 2017-07-18 DIAGNOSIS — Z1331 Encounter for screening for depression: Secondary | ICD-10-CM | POA: Diagnosis not present

## 2017-07-18 DIAGNOSIS — E785 Hyperlipidemia, unspecified: Secondary | ICD-10-CM | POA: Diagnosis not present

## 2017-07-18 DIAGNOSIS — Z125 Encounter for screening for malignant neoplasm of prostate: Secondary | ICD-10-CM | POA: Diagnosis not present

## 2017-07-18 DIAGNOSIS — Z9181 History of falling: Secondary | ICD-10-CM | POA: Diagnosis not present

## 2017-07-18 DIAGNOSIS — Z0000 Encounter for general adult medical examination without abnormal findings: Secondary | ICD-10-CM | POA: Diagnosis not present

## 2017-07-18 DIAGNOSIS — Z1211 Encounter for screening for malignant neoplasm of colon: Secondary | ICD-10-CM | POA: Diagnosis not present

## 2017-07-31 DIAGNOSIS — Z1211 Encounter for screening for malignant neoplasm of colon: Secondary | ICD-10-CM | POA: Diagnosis not present

## 2017-07-31 DIAGNOSIS — Z1212 Encounter for screening for malignant neoplasm of rectum: Secondary | ICD-10-CM | POA: Diagnosis not present

## 2017-09-11 DIAGNOSIS — Z1331 Encounter for screening for depression: Secondary | ICD-10-CM | POA: Diagnosis not present

## 2017-09-17 DIAGNOSIS — D124 Benign neoplasm of descending colon: Secondary | ICD-10-CM | POA: Diagnosis not present

## 2017-09-17 DIAGNOSIS — D122 Benign neoplasm of ascending colon: Secondary | ICD-10-CM | POA: Diagnosis not present

## 2017-09-17 DIAGNOSIS — Z1211 Encounter for screening for malignant neoplasm of colon: Secondary | ICD-10-CM | POA: Diagnosis not present

## 2017-09-17 DIAGNOSIS — K635 Polyp of colon: Secondary | ICD-10-CM | POA: Diagnosis not present

## 2017-09-17 DIAGNOSIS — D125 Benign neoplasm of sigmoid colon: Secondary | ICD-10-CM | POA: Diagnosis not present

## 2017-09-17 DIAGNOSIS — D123 Benign neoplasm of transverse colon: Secondary | ICD-10-CM | POA: Diagnosis not present

## 2017-09-17 DIAGNOSIS — D126 Benign neoplasm of colon, unspecified: Secondary | ICD-10-CM | POA: Diagnosis not present

## 2017-09-18 DIAGNOSIS — R0789 Other chest pain: Secondary | ICD-10-CM | POA: Diagnosis not present

## 2017-09-18 DIAGNOSIS — R918 Other nonspecific abnormal finding of lung field: Secondary | ICD-10-CM | POA: Diagnosis not present

## 2017-09-18 DIAGNOSIS — K8020 Calculus of gallbladder without cholecystitis without obstruction: Secondary | ICD-10-CM | POA: Diagnosis not present

## 2017-09-18 DIAGNOSIS — I700 Atherosclerosis of aorta: Secondary | ICD-10-CM | POA: Diagnosis not present

## 2017-10-15 DIAGNOSIS — J449 Chronic obstructive pulmonary disease, unspecified: Secondary | ICD-10-CM | POA: Diagnosis not present

## 2017-10-15 DIAGNOSIS — R140 Abdominal distension (gaseous): Secondary | ICD-10-CM | POA: Diagnosis not present

## 2017-10-15 DIAGNOSIS — Z79899 Other long term (current) drug therapy: Secondary | ICD-10-CM | POA: Diagnosis not present

## 2017-10-15 DIAGNOSIS — Z8572 Personal history of non-Hodgkin lymphomas: Secondary | ICD-10-CM | POA: Diagnosis not present

## 2017-10-23 DIAGNOSIS — L820 Inflamed seborrheic keratosis: Secondary | ICD-10-CM | POA: Diagnosis not present

## 2017-10-23 DIAGNOSIS — D485 Neoplasm of uncertain behavior of skin: Secondary | ICD-10-CM | POA: Diagnosis not present

## 2017-11-10 DIAGNOSIS — C8590 Non-Hodgkin lymphoma, unspecified, unspecified site: Secondary | ICD-10-CM | POA: Diagnosis not present

## 2017-11-10 DIAGNOSIS — E1140 Type 2 diabetes mellitus with diabetic neuropathy, unspecified: Secondary | ICD-10-CM | POA: Diagnosis not present

## 2017-11-10 DIAGNOSIS — E785 Hyperlipidemia, unspecified: Secondary | ICD-10-CM | POA: Diagnosis not present

## 2017-11-10 DIAGNOSIS — I10 Essential (primary) hypertension: Secondary | ICD-10-CM | POA: Diagnosis not present

## 2017-11-10 DIAGNOSIS — I4891 Unspecified atrial fibrillation: Secondary | ICD-10-CM | POA: Diagnosis not present

## 2017-11-10 DIAGNOSIS — J449 Chronic obstructive pulmonary disease, unspecified: Secondary | ICD-10-CM | POA: Diagnosis not present

## 2017-11-20 DIAGNOSIS — K8020 Calculus of gallbladder without cholecystitis without obstruction: Secondary | ICD-10-CM | POA: Diagnosis not present

## 2017-11-20 DIAGNOSIS — K599 Functional intestinal disorder, unspecified: Secondary | ICD-10-CM | POA: Diagnosis not present

## 2017-11-20 DIAGNOSIS — K740 Hepatic fibrosis: Secondary | ICD-10-CM | POA: Diagnosis not present

## 2017-12-03 DIAGNOSIS — K7460 Unspecified cirrhosis of liver: Secondary | ICD-10-CM | POA: Diagnosis not present

## 2017-12-08 DIAGNOSIS — K2960 Other gastritis without bleeding: Secondary | ICD-10-CM | POA: Diagnosis not present

## 2017-12-08 DIAGNOSIS — K7460 Unspecified cirrhosis of liver: Secondary | ICD-10-CM | POA: Diagnosis not present

## 2017-12-11 DIAGNOSIS — R5382 Chronic fatigue, unspecified: Secondary | ICD-10-CM | POA: Diagnosis not present

## 2017-12-11 DIAGNOSIS — C8590 Non-Hodgkin lymphoma, unspecified, unspecified site: Secondary | ICD-10-CM | POA: Diagnosis not present

## 2017-12-11 DIAGNOSIS — G4700 Insomnia, unspecified: Secondary | ICD-10-CM | POA: Diagnosis not present

## 2017-12-22 DIAGNOSIS — I5022 Chronic systolic (congestive) heart failure: Secondary | ICD-10-CM | POA: Insufficient documentation

## 2017-12-22 DIAGNOSIS — I5023 Acute on chronic systolic (congestive) heart failure: Secondary | ICD-10-CM | POA: Insufficient documentation

## 2017-12-22 DIAGNOSIS — E7800 Pure hypercholesterolemia, unspecified: Secondary | ICD-10-CM | POA: Insufficient documentation

## 2017-12-23 DIAGNOSIS — I110 Hypertensive heart disease with heart failure: Secondary | ICD-10-CM | POA: Diagnosis not present

## 2017-12-23 DIAGNOSIS — I480 Paroxysmal atrial fibrillation: Secondary | ICD-10-CM | POA: Diagnosis not present

## 2017-12-23 DIAGNOSIS — I5022 Chronic systolic (congestive) heart failure: Secondary | ICD-10-CM | POA: Diagnosis not present

## 2017-12-23 DIAGNOSIS — E7800 Pure hypercholesterolemia, unspecified: Secondary | ICD-10-CM | POA: Diagnosis not present

## 2017-12-23 DIAGNOSIS — R9431 Abnormal electrocardiogram [ECG] [EKG]: Secondary | ICD-10-CM | POA: Diagnosis not present

## 2017-12-23 DIAGNOSIS — E1122 Type 2 diabetes mellitus with diabetic chronic kidney disease: Secondary | ICD-10-CM | POA: Diagnosis not present

## 2018-01-12 DIAGNOSIS — G4700 Insomnia, unspecified: Secondary | ICD-10-CM | POA: Diagnosis not present

## 2018-01-12 DIAGNOSIS — Z79899 Other long term (current) drug therapy: Secondary | ICD-10-CM | POA: Diagnosis not present

## 2018-01-17 DIAGNOSIS — E1165 Type 2 diabetes mellitus with hyperglycemia: Secondary | ICD-10-CM | POA: Diagnosis not present

## 2018-01-17 DIAGNOSIS — Z8673 Personal history of transient ischemic attack (TIA), and cerebral infarction without residual deficits: Secondary | ICD-10-CM | POA: Diagnosis not present

## 2018-01-17 DIAGNOSIS — I130 Hypertensive heart and chronic kidney disease with heart failure and stage 1 through stage 4 chronic kidney disease, or unspecified chronic kidney disease: Secondary | ICD-10-CM | POA: Diagnosis not present

## 2018-01-17 DIAGNOSIS — I509 Heart failure, unspecified: Secondary | ICD-10-CM | POA: Diagnosis not present

## 2018-01-17 DIAGNOSIS — Z79899 Other long term (current) drug therapy: Secondary | ICD-10-CM | POA: Diagnosis not present

## 2018-01-17 DIAGNOSIS — J810 Acute pulmonary edema: Secondary | ICD-10-CM | POA: Diagnosis not present

## 2018-01-17 DIAGNOSIS — Z9111 Patient's noncompliance with dietary regimen: Secondary | ICD-10-CM | POA: Diagnosis not present

## 2018-01-17 DIAGNOSIS — Z7982 Long term (current) use of aspirin: Secondary | ICD-10-CM | POA: Diagnosis not present

## 2018-01-17 DIAGNOSIS — Z794 Long term (current) use of insulin: Secondary | ICD-10-CM | POA: Diagnosis not present

## 2018-01-17 DIAGNOSIS — N183 Chronic kidney disease, stage 3 (moderate): Secondary | ICD-10-CM | POA: Diagnosis not present

## 2018-01-17 DIAGNOSIS — J9601 Acute respiratory failure with hypoxia: Secondary | ICD-10-CM | POA: Diagnosis not present

## 2018-01-17 DIAGNOSIS — E1122 Type 2 diabetes mellitus with diabetic chronic kidney disease: Secondary | ICD-10-CM | POA: Diagnosis not present

## 2018-01-17 DIAGNOSIS — I110 Hypertensive heart disease with heart failure: Secondary | ICD-10-CM | POA: Diagnosis not present

## 2018-01-17 DIAGNOSIS — I480 Paroxysmal atrial fibrillation: Secondary | ICD-10-CM | POA: Diagnosis not present

## 2018-01-17 DIAGNOSIS — N179 Acute kidney failure, unspecified: Secondary | ICD-10-CM | POA: Diagnosis not present

## 2018-01-17 DIAGNOSIS — I5023 Acute on chronic systolic (congestive) heart failure: Secondary | ICD-10-CM | POA: Diagnosis not present

## 2018-01-17 DIAGNOSIS — R0602 Shortness of breath: Secondary | ICD-10-CM | POA: Diagnosis not present

## 2018-01-17 DIAGNOSIS — R05 Cough: Secondary | ICD-10-CM | POA: Diagnosis not present

## 2018-01-17 DIAGNOSIS — G8929 Other chronic pain: Secondary | ICD-10-CM | POA: Diagnosis not present

## 2018-01-18 DIAGNOSIS — I130 Hypertensive heart and chronic kidney disease with heart failure and stage 1 through stage 4 chronic kidney disease, or unspecified chronic kidney disease: Secondary | ICD-10-CM | POA: Diagnosis not present

## 2018-01-18 DIAGNOSIS — N183 Chronic kidney disease, stage 3 (moderate): Secondary | ICD-10-CM | POA: Diagnosis not present

## 2018-01-18 DIAGNOSIS — J9601 Acute respiratory failure with hypoxia: Secondary | ICD-10-CM | POA: Diagnosis not present

## 2018-01-18 DIAGNOSIS — N179 Acute kidney failure, unspecified: Secondary | ICD-10-CM | POA: Diagnosis not present

## 2018-01-18 DIAGNOSIS — E1122 Type 2 diabetes mellitus with diabetic chronic kidney disease: Secondary | ICD-10-CM | POA: Diagnosis not present

## 2018-01-18 DIAGNOSIS — I5023 Acute on chronic systolic (congestive) heart failure: Secondary | ICD-10-CM | POA: Diagnosis not present

## 2018-01-19 DIAGNOSIS — N179 Acute kidney failure, unspecified: Secondary | ICD-10-CM | POA: Diagnosis not present

## 2018-01-19 DIAGNOSIS — N183 Chronic kidney disease, stage 3 (moderate): Secondary | ICD-10-CM | POA: Diagnosis not present

## 2018-01-19 DIAGNOSIS — I130 Hypertensive heart and chronic kidney disease with heart failure and stage 1 through stage 4 chronic kidney disease, or unspecified chronic kidney disease: Secondary | ICD-10-CM | POA: Diagnosis not present

## 2018-01-19 DIAGNOSIS — I5023 Acute on chronic systolic (congestive) heart failure: Secondary | ICD-10-CM | POA: Diagnosis not present

## 2018-01-19 DIAGNOSIS — E1122 Type 2 diabetes mellitus with diabetic chronic kidney disease: Secondary | ICD-10-CM | POA: Diagnosis not present

## 2018-01-19 DIAGNOSIS — J9601 Acute respiratory failure with hypoxia: Secondary | ICD-10-CM | POA: Diagnosis not present

## 2018-01-22 DIAGNOSIS — J9601 Acute respiratory failure with hypoxia: Secondary | ICD-10-CM | POA: Diagnosis not present

## 2018-01-22 DIAGNOSIS — Z87891 Personal history of nicotine dependence: Secondary | ICD-10-CM | POA: Diagnosis not present

## 2018-01-22 DIAGNOSIS — N183 Chronic kidney disease, stage 3 (moderate): Secondary | ICD-10-CM | POA: Diagnosis not present

## 2018-01-22 DIAGNOSIS — N189 Chronic kidney disease, unspecified: Secondary | ICD-10-CM

## 2018-01-22 DIAGNOSIS — M48061 Spinal stenosis, lumbar region without neurogenic claudication: Secondary | ICD-10-CM | POA: Diagnosis not present

## 2018-01-22 DIAGNOSIS — J4521 Mild intermittent asthma with (acute) exacerbation: Secondary | ICD-10-CM | POA: Diagnosis not present

## 2018-01-22 DIAGNOSIS — M549 Dorsalgia, unspecified: Secondary | ICD-10-CM | POA: Diagnosis not present

## 2018-01-22 DIAGNOSIS — M545 Low back pain: Secondary | ICD-10-CM | POA: Diagnosis not present

## 2018-01-22 DIAGNOSIS — M79661 Pain in right lower leg: Secondary | ICD-10-CM | POA: Diagnosis not present

## 2018-01-22 DIAGNOSIS — I480 Paroxysmal atrial fibrillation: Secondary | ICD-10-CM | POA: Diagnosis not present

## 2018-01-22 DIAGNOSIS — I4891 Unspecified atrial fibrillation: Secondary | ICD-10-CM

## 2018-01-22 DIAGNOSIS — E1169 Type 2 diabetes mellitus with other specified complication: Secondary | ICD-10-CM | POA: Diagnosis not present

## 2018-01-22 DIAGNOSIS — I10 Essential (primary) hypertension: Secondary | ICD-10-CM

## 2018-01-22 DIAGNOSIS — R0902 Hypoxemia: Secondary | ICD-10-CM | POA: Diagnosis not present

## 2018-01-22 DIAGNOSIS — I130 Hypertensive heart and chronic kidney disease with heart failure and stage 1 through stage 4 chronic kidney disease, or unspecified chronic kidney disease: Secondary | ICD-10-CM | POA: Diagnosis not present

## 2018-01-22 DIAGNOSIS — M109 Gout, unspecified: Secondary | ICD-10-CM | POA: Diagnosis not present

## 2018-01-22 DIAGNOSIS — M7989 Other specified soft tissue disorders: Secondary | ICD-10-CM | POA: Diagnosis not present

## 2018-01-22 DIAGNOSIS — E1122 Type 2 diabetes mellitus with diabetic chronic kidney disease: Secondary | ICD-10-CM | POA: Diagnosis not present

## 2018-01-22 DIAGNOSIS — I110 Hypertensive heart disease with heart failure: Secondary | ICD-10-CM | POA: Diagnosis not present

## 2018-01-22 DIAGNOSIS — I5023 Acute on chronic systolic (congestive) heart failure: Secondary | ICD-10-CM | POA: Diagnosis not present

## 2018-01-22 DIAGNOSIS — E669 Obesity, unspecified: Secondary | ICD-10-CM | POA: Diagnosis not present

## 2018-01-22 DIAGNOSIS — M5127 Other intervertebral disc displacement, lumbosacral region: Secondary | ICD-10-CM | POA: Diagnosis not present

## 2018-01-22 DIAGNOSIS — R0989 Other specified symptoms and signs involving the circulatory and respiratory systems: Secondary | ICD-10-CM | POA: Diagnosis not present

## 2018-01-22 DIAGNOSIS — I160 Hypertensive urgency: Secondary | ICD-10-CM | POA: Diagnosis not present

## 2018-01-22 DIAGNOSIS — I509 Heart failure, unspecified: Secondary | ICD-10-CM

## 2018-01-22 DIAGNOSIS — G8929 Other chronic pain: Secondary | ICD-10-CM | POA: Diagnosis not present

## 2018-01-22 DIAGNOSIS — R011 Cardiac murmur, unspecified: Secondary | ICD-10-CM | POA: Diagnosis not present

## 2018-01-22 DIAGNOSIS — Z9119 Patient's noncompliance with other medical treatment and regimen: Secondary | ICD-10-CM | POA: Diagnosis not present

## 2018-01-23 DIAGNOSIS — I5023 Acute on chronic systolic (congestive) heart failure: Secondary | ICD-10-CM

## 2018-01-23 DIAGNOSIS — M109 Gout, unspecified: Secondary | ICD-10-CM

## 2018-01-23 DIAGNOSIS — R011 Cardiac murmur, unspecified: Secondary | ICD-10-CM | POA: Diagnosis not present

## 2018-01-23 DIAGNOSIS — N183 Chronic kidney disease, stage 3 (moderate): Secondary | ICD-10-CM

## 2018-01-23 DIAGNOSIS — I160 Hypertensive urgency: Secondary | ICD-10-CM

## 2018-01-23 DIAGNOSIS — E669 Obesity, unspecified: Secondary | ICD-10-CM

## 2018-01-23 DIAGNOSIS — I480 Paroxysmal atrial fibrillation: Secondary | ICD-10-CM

## 2018-01-23 DIAGNOSIS — E1169 Type 2 diabetes mellitus with other specified complication: Secondary | ICD-10-CM

## 2018-01-27 DIAGNOSIS — Z09 Encounter for follow-up examination after completed treatment for conditions other than malignant neoplasm: Secondary | ICD-10-CM | POA: Diagnosis not present

## 2018-01-27 DIAGNOSIS — M109 Gout, unspecified: Secondary | ICD-10-CM | POA: Diagnosis not present

## 2018-01-27 DIAGNOSIS — I509 Heart failure, unspecified: Secondary | ICD-10-CM | POA: Diagnosis not present

## 2018-01-30 DIAGNOSIS — E1122 Type 2 diabetes mellitus with diabetic chronic kidney disease: Secondary | ICD-10-CM | POA: Diagnosis not present

## 2018-01-30 DIAGNOSIS — I5022 Chronic systolic (congestive) heart failure: Secondary | ICD-10-CM | POA: Diagnosis not present

## 2018-01-30 DIAGNOSIS — I480 Paroxysmal atrial fibrillation: Secondary | ICD-10-CM | POA: Diagnosis not present

## 2018-01-30 DIAGNOSIS — I509 Heart failure, unspecified: Secondary | ICD-10-CM | POA: Diagnosis not present

## 2018-01-30 DIAGNOSIS — I110 Hypertensive heart disease with heart failure: Secondary | ICD-10-CM | POA: Diagnosis not present

## 2018-02-05 DIAGNOSIS — C8590 Non-Hodgkin lymphoma, unspecified, unspecified site: Secondary | ICD-10-CM | POA: Diagnosis not present

## 2018-02-05 DIAGNOSIS — Z79899 Other long term (current) drug therapy: Secondary | ICD-10-CM | POA: Diagnosis not present

## 2018-02-05 DIAGNOSIS — J449 Chronic obstructive pulmonary disease, unspecified: Secondary | ICD-10-CM | POA: Diagnosis not present

## 2018-02-05 DIAGNOSIS — I509 Heart failure, unspecified: Secondary | ICD-10-CM | POA: Diagnosis not present

## 2018-02-19 DIAGNOSIS — R42 Dizziness and giddiness: Secondary | ICD-10-CM | POA: Diagnosis not present

## 2018-02-19 DIAGNOSIS — G4700 Insomnia, unspecified: Secondary | ICD-10-CM | POA: Diagnosis not present

## 2018-02-19 DIAGNOSIS — I509 Heart failure, unspecified: Secondary | ICD-10-CM | POA: Diagnosis not present

## 2018-03-19 DIAGNOSIS — I509 Heart failure, unspecified: Secondary | ICD-10-CM | POA: Diagnosis not present

## 2018-03-19 DIAGNOSIS — Z1339 Encounter for screening examination for other mental health and behavioral disorders: Secondary | ICD-10-CM | POA: Diagnosis not present

## 2018-03-19 DIAGNOSIS — E785 Hyperlipidemia, unspecified: Secondary | ICD-10-CM | POA: Diagnosis not present

## 2018-03-19 DIAGNOSIS — I4891 Unspecified atrial fibrillation: Secondary | ICD-10-CM | POA: Diagnosis not present

## 2018-03-19 DIAGNOSIS — Z125 Encounter for screening for malignant neoplasm of prostate: Secondary | ICD-10-CM | POA: Diagnosis not present

## 2018-03-19 DIAGNOSIS — I10 Essential (primary) hypertension: Secondary | ICD-10-CM | POA: Diagnosis not present

## 2018-03-19 DIAGNOSIS — E1140 Type 2 diabetes mellitus with diabetic neuropathy, unspecified: Secondary | ICD-10-CM | POA: Diagnosis not present

## 2018-04-10 DIAGNOSIS — E1122 Type 2 diabetes mellitus with diabetic chronic kidney disease: Secondary | ICD-10-CM | POA: Diagnosis not present

## 2018-04-10 DIAGNOSIS — I129 Hypertensive chronic kidney disease with stage 1 through stage 4 chronic kidney disease, or unspecified chronic kidney disease: Secondary | ICD-10-CM | POA: Diagnosis not present

## 2018-04-10 DIAGNOSIS — N183 Chronic kidney disease, stage 3 (moderate): Secondary | ICD-10-CM | POA: Diagnosis not present

## 2018-04-20 DIAGNOSIS — M109 Gout, unspecified: Secondary | ICD-10-CM | POA: Diagnosis not present

## 2018-04-20 DIAGNOSIS — G4700 Insomnia, unspecified: Secondary | ICD-10-CM | POA: Diagnosis not present

## 2018-04-20 DIAGNOSIS — Z79899 Other long term (current) drug therapy: Secondary | ICD-10-CM | POA: Diagnosis not present

## 2018-04-20 DIAGNOSIS — M5412 Radiculopathy, cervical region: Secondary | ICD-10-CM | POA: Diagnosis not present

## 2018-05-21 DIAGNOSIS — M5090 Cervical disc disorder, unspecified, unspecified cervical region: Secondary | ICD-10-CM | POA: Diagnosis not present

## 2018-05-21 DIAGNOSIS — R5382 Chronic fatigue, unspecified: Secondary | ICD-10-CM | POA: Diagnosis not present

## 2018-05-21 DIAGNOSIS — G4700 Insomnia, unspecified: Secondary | ICD-10-CM | POA: Diagnosis not present

## 2018-06-17 ENCOUNTER — Ambulatory Visit: Payer: Medicare Other | Admitting: Neurology

## 2018-06-18 DIAGNOSIS — M5090 Cervical disc disorder, unspecified, unspecified cervical region: Secondary | ICD-10-CM | POA: Diagnosis not present

## 2018-06-18 DIAGNOSIS — G4700 Insomnia, unspecified: Secondary | ICD-10-CM | POA: Diagnosis not present

## 2018-06-18 DIAGNOSIS — R5382 Chronic fatigue, unspecified: Secondary | ICD-10-CM | POA: Diagnosis not present

## 2018-06-18 DIAGNOSIS — Z2821 Immunization not carried out because of patient refusal: Secondary | ICD-10-CM | POA: Diagnosis not present

## 2018-07-01 DIAGNOSIS — I480 Paroxysmal atrial fibrillation: Secondary | ICD-10-CM | POA: Diagnosis not present

## 2018-07-01 DIAGNOSIS — E1122 Type 2 diabetes mellitus with diabetic chronic kidney disease: Secondary | ICD-10-CM | POA: Diagnosis not present

## 2018-07-01 DIAGNOSIS — E7800 Pure hypercholesterolemia, unspecified: Secondary | ICD-10-CM | POA: Diagnosis not present

## 2018-07-01 DIAGNOSIS — I130 Hypertensive heart and chronic kidney disease with heart failure and stage 1 through stage 4 chronic kidney disease, or unspecified chronic kidney disease: Secondary | ICD-10-CM | POA: Diagnosis not present

## 2018-07-01 DIAGNOSIS — I5022 Chronic systolic (congestive) heart failure: Secondary | ICD-10-CM | POA: Diagnosis not present

## 2018-07-02 DIAGNOSIS — R9431 Abnormal electrocardiogram [ECG] [EKG]: Secondary | ICD-10-CM | POA: Diagnosis not present

## 2018-07-02 DIAGNOSIS — R001 Bradycardia, unspecified: Secondary | ICD-10-CM | POA: Diagnosis not present

## 2018-07-02 DIAGNOSIS — I440 Atrioventricular block, first degree: Secondary | ICD-10-CM | POA: Diagnosis not present

## 2018-07-03 DIAGNOSIS — I5022 Chronic systolic (congestive) heart failure: Secondary | ICD-10-CM | POA: Diagnosis not present

## 2018-07-20 DIAGNOSIS — E1140 Type 2 diabetes mellitus with diabetic neuropathy, unspecified: Secondary | ICD-10-CM | POA: Diagnosis not present

## 2018-07-20 DIAGNOSIS — I509 Heart failure, unspecified: Secondary | ICD-10-CM | POA: Diagnosis not present

## 2018-07-20 DIAGNOSIS — I4891 Unspecified atrial fibrillation: Secondary | ICD-10-CM | POA: Diagnosis not present

## 2018-07-20 DIAGNOSIS — I10 Essential (primary) hypertension: Secondary | ICD-10-CM | POA: Diagnosis not present

## 2018-07-20 DIAGNOSIS — J449 Chronic obstructive pulmonary disease, unspecified: Secondary | ICD-10-CM | POA: Diagnosis not present

## 2018-08-19 DIAGNOSIS — E1140 Type 2 diabetes mellitus with diabetic neuropathy, unspecified: Secondary | ICD-10-CM | POA: Diagnosis not present

## 2018-08-19 DIAGNOSIS — E041 Nontoxic single thyroid nodule: Secondary | ICD-10-CM | POA: Diagnosis not present

## 2018-08-19 DIAGNOSIS — J449 Chronic obstructive pulmonary disease, unspecified: Secondary | ICD-10-CM | POA: Diagnosis not present

## 2018-08-19 DIAGNOSIS — R600 Localized edema: Secondary | ICD-10-CM | POA: Diagnosis not present

## 2018-08-19 DIAGNOSIS — Z9181 History of falling: Secondary | ICD-10-CM | POA: Diagnosis not present

## 2018-08-19 DIAGNOSIS — I509 Heart failure, unspecified: Secondary | ICD-10-CM | POA: Diagnosis not present

## 2018-08-21 DIAGNOSIS — R600 Localized edema: Secondary | ICD-10-CM | POA: Diagnosis not present

## 2018-09-07 DIAGNOSIS — Z79899 Other long term (current) drug therapy: Secondary | ICD-10-CM | POA: Diagnosis not present

## 2018-09-07 DIAGNOSIS — M159 Polyosteoarthritis, unspecified: Secondary | ICD-10-CM | POA: Diagnosis not present

## 2018-09-09 DIAGNOSIS — D631 Anemia in chronic kidney disease: Secondary | ICD-10-CM | POA: Diagnosis not present

## 2018-09-09 DIAGNOSIS — N189 Chronic kidney disease, unspecified: Secondary | ICD-10-CM | POA: Diagnosis not present

## 2018-09-09 DIAGNOSIS — I129 Hypertensive chronic kidney disease with stage 1 through stage 4 chronic kidney disease, or unspecified chronic kidney disease: Secondary | ICD-10-CM | POA: Diagnosis not present

## 2018-09-09 DIAGNOSIS — R809 Proteinuria, unspecified: Secondary | ICD-10-CM | POA: Diagnosis not present

## 2018-09-09 DIAGNOSIS — N184 Chronic kidney disease, stage 4 (severe): Secondary | ICD-10-CM | POA: Diagnosis not present

## 2018-09-14 DIAGNOSIS — Z79899 Other long term (current) drug therapy: Secondary | ICD-10-CM | POA: Diagnosis not present

## 2018-09-14 DIAGNOSIS — G4700 Insomnia, unspecified: Secondary | ICD-10-CM | POA: Diagnosis not present

## 2018-09-14 DIAGNOSIS — M5090 Cervical disc disorder, unspecified, unspecified cervical region: Secondary | ICD-10-CM | POA: Diagnosis not present

## 2018-09-14 DIAGNOSIS — M159 Polyosteoarthritis, unspecified: Secondary | ICD-10-CM | POA: Diagnosis not present

## 2018-09-18 ENCOUNTER — Other Ambulatory Visit: Payer: Self-pay | Admitting: Nephrology

## 2018-09-18 DIAGNOSIS — N184 Chronic kidney disease, stage 4 (severe): Secondary | ICD-10-CM

## 2018-09-28 ENCOUNTER — Ambulatory Visit
Admission: RE | Admit: 2018-09-28 | Discharge: 2018-09-28 | Disposition: A | Discharge status: Home / Self Care | Payer: Medicare Other | Source: Ambulatory Visit | Attending: Nephrology | Admitting: Nephrology

## 2018-09-28 DIAGNOSIS — N184 Chronic kidney disease, stage 4 (severe): Secondary | ICD-10-CM

## 2018-10-16 DIAGNOSIS — Z8739 Personal history of other diseases of the musculoskeletal system and connective tissue: Secondary | ICD-10-CM | POA: Diagnosis not present

## 2018-10-16 DIAGNOSIS — E1165 Type 2 diabetes mellitus with hyperglycemia: Secondary | ICD-10-CM | POA: Diagnosis not present

## 2018-10-16 DIAGNOSIS — M7702 Medial epicondylitis, left elbow: Secondary | ICD-10-CM | POA: Diagnosis not present

## 2018-10-16 DIAGNOSIS — M109 Gout, unspecified: Secondary | ICD-10-CM | POA: Diagnosis not present

## 2018-10-16 DIAGNOSIS — M25522 Pain in left elbow: Secondary | ICD-10-CM | POA: Diagnosis not present

## 2018-10-21 DIAGNOSIS — M109 Gout, unspecified: Secondary | ICD-10-CM | POA: Diagnosis not present

## 2018-10-21 DIAGNOSIS — E1140 Type 2 diabetes mellitus with diabetic neuropathy, unspecified: Secondary | ICD-10-CM | POA: Diagnosis not present

## 2018-10-21 DIAGNOSIS — N184 Chronic kidney disease, stage 4 (severe): Secondary | ICD-10-CM | POA: Diagnosis not present

## 2018-10-22 ENCOUNTER — Other Ambulatory Visit: Payer: Self-pay

## 2018-10-22 NOTE — Patient Outreach (Signed)
[facility] [facility]) Care Management  10/22/2018  JUSTUN ANAYA [birth year] [id number]   Referral Date: 10/21/2018 Referral Source: [facility] referral Referral Reason: Medication Assistance with Lantus   Outreach Attempt: telephone call to patient.  Male answered stating wrong number.    Plan: RN CM will attempt patient by mail and if no response will close case.    Jone Baseman, RN, MSN [facility] Care Management Care Management Coordinator Direct Line [phone] Toll Free: [phone]  Fax: [phone]

## 2018-11-03 DIAGNOSIS — M109 Gout, unspecified: Secondary | ICD-10-CM | POA: Diagnosis not present

## 2018-11-04 ENCOUNTER — Other Ambulatory Visit: Payer: Self-pay

## 2018-11-04 NOTE — Patient Outreach (Signed)
[place] [facility]) Care Management  11/04/2018  Curtis Mays 1960-12-16 [id number]   Multiple attempts to establish contact with patient without success. No response from letter mailed to patient.   Plan: RN CM will close case at this time.   Jone Baseman, RN, MSN [facility] Management Care Management Coordinator Direct Line [phone] Cell [phone] Toll Free: [phone]  Fax: [phone]

## 2018-11-16 DIAGNOSIS — I509 Heart failure, unspecified: Secondary | ICD-10-CM | POA: Diagnosis not present

## 2018-11-16 DIAGNOSIS — I4891 Unspecified atrial fibrillation: Secondary | ICD-10-CM | POA: Diagnosis not present

## 2018-11-16 DIAGNOSIS — N184 Chronic kidney disease, stage 4 (severe): Secondary | ICD-10-CM | POA: Diagnosis not present

## 2018-11-16 DIAGNOSIS — I10 Essential (primary) hypertension: Secondary | ICD-10-CM | POA: Diagnosis not present

## 2018-11-16 DIAGNOSIS — E1140 Type 2 diabetes mellitus with diabetic neuropathy, unspecified: Secondary | ICD-10-CM | POA: Diagnosis not present

## 2018-11-16 DIAGNOSIS — J449 Chronic obstructive pulmonary disease, unspecified: Secondary | ICD-10-CM | POA: Diagnosis not present

## 2018-12-10 DIAGNOSIS — E785 Hyperlipidemia, unspecified: Secondary | ICD-10-CM | POA: Diagnosis not present

## 2018-12-10 DIAGNOSIS — Z0000 Encounter for general adult medical examination without abnormal findings: Secondary | ICD-10-CM | POA: Diagnosis not present

## 2018-12-10 DIAGNOSIS — Z9181 History of falling: Secondary | ICD-10-CM | POA: Diagnosis not present

## 2019-01-08 DIAGNOSIS — N184 Chronic kidney disease, stage 4 (severe): Secondary | ICD-10-CM | POA: Diagnosis not present

## 2019-01-08 DIAGNOSIS — R600 Localized edema: Secondary | ICD-10-CM | POA: Diagnosis not present

## 2019-01-08 DIAGNOSIS — D631 Anemia in chronic kidney disease: Secondary | ICD-10-CM | POA: Diagnosis not present

## 2019-01-08 DIAGNOSIS — I129 Hypertensive chronic kidney disease with stage 1 through stage 4 chronic kidney disease, or unspecified chronic kidney disease: Secondary | ICD-10-CM | POA: Diagnosis not present

## 2019-01-08 DIAGNOSIS — E559 Vitamin D deficiency, unspecified: Secondary | ICD-10-CM | POA: Diagnosis not present

## 2019-01-08 DIAGNOSIS — N189 Chronic kidney disease, unspecified: Secondary | ICD-10-CM | POA: Diagnosis not present

## 2019-01-20 DIAGNOSIS — Z79899 Other long term (current) drug therapy: Secondary | ICD-10-CM | POA: Diagnosis not present

## 2019-01-20 DIAGNOSIS — R0600 Dyspnea, unspecified: Secondary | ICD-10-CM | POA: Diagnosis not present

## 2019-01-20 DIAGNOSIS — M5090 Cervical disc disorder, unspecified, unspecified cervical region: Secondary | ICD-10-CM | POA: Diagnosis not present

## 2019-01-20 DIAGNOSIS — I509 Heart failure, unspecified: Secondary | ICD-10-CM | POA: Diagnosis not present

## 2019-01-26 DIAGNOSIS — I480 Paroxysmal atrial fibrillation: Secondary | ICD-10-CM | POA: Diagnosis not present

## 2019-02-04 DIAGNOSIS — R0602 Shortness of breath: Secondary | ICD-10-CM | POA: Diagnosis not present

## 2019-02-04 DIAGNOSIS — N183 Chronic kidney disease, stage 3 (moderate): Secondary | ICD-10-CM | POA: Diagnosis not present

## 2019-02-04 DIAGNOSIS — I130 Hypertensive heart and chronic kidney disease with heart failure and stage 1 through stage 4 chronic kidney disease, or unspecified chronic kidney disease: Secondary | ICD-10-CM | POA: Diagnosis not present

## 2019-02-04 DIAGNOSIS — D649 Anemia, unspecified: Secondary | ICD-10-CM | POA: Diagnosis not present

## 2019-02-04 DIAGNOSIS — R1084 Generalized abdominal pain: Secondary | ICD-10-CM | POA: Diagnosis not present

## 2019-02-04 DIAGNOSIS — K5900 Constipation, unspecified: Secondary | ICD-10-CM | POA: Diagnosis not present

## 2019-02-04 DIAGNOSIS — I10 Essential (primary) hypertension: Secondary | ICD-10-CM | POA: Diagnosis not present

## 2019-02-04 DIAGNOSIS — I5023 Acute on chronic systolic (congestive) heart failure: Secondary | ICD-10-CM | POA: Diagnosis not present

## 2019-02-04 DIAGNOSIS — E119 Type 2 diabetes mellitus without complications: Secondary | ICD-10-CM | POA: Diagnosis not present

## 2019-02-04 DIAGNOSIS — N179 Acute kidney failure, unspecified: Secondary | ICD-10-CM | POA: Diagnosis not present

## 2019-02-04 DIAGNOSIS — R9431 Abnormal electrocardiogram [ECG] [EKG]: Secondary | ICD-10-CM | POA: Diagnosis not present

## 2019-02-04 DIAGNOSIS — I480 Paroxysmal atrial fibrillation: Secondary | ICD-10-CM | POA: Diagnosis not present

## 2019-02-04 DIAGNOSIS — Z79899 Other long term (current) drug therapy: Secondary | ICD-10-CM | POA: Diagnosis not present

## 2019-02-04 DIAGNOSIS — E1165 Type 2 diabetes mellitus with hyperglycemia: Secondary | ICD-10-CM | POA: Diagnosis not present

## 2019-02-04 DIAGNOSIS — R079 Chest pain, unspecified: Secondary | ICD-10-CM | POA: Diagnosis not present

## 2019-02-04 DIAGNOSIS — I110 Hypertensive heart disease with heart failure: Secondary | ICD-10-CM | POA: Diagnosis not present

## 2019-02-04 DIAGNOSIS — Z794 Long term (current) use of insulin: Secondary | ICD-10-CM | POA: Diagnosis not present

## 2019-02-04 DIAGNOSIS — Z7901 Long term (current) use of anticoagulants: Secondary | ICD-10-CM | POA: Diagnosis not present

## 2019-02-04 DIAGNOSIS — E8770 Fluid overload, unspecified: Secondary | ICD-10-CM | POA: Diagnosis not present

## 2019-02-04 DIAGNOSIS — K7460 Unspecified cirrhosis of liver: Secondary | ICD-10-CM | POA: Diagnosis not present

## 2019-02-04 DIAGNOSIS — Z8572 Personal history of non-Hodgkin lymphomas: Secondary | ICD-10-CM | POA: Diagnosis not present

## 2019-02-04 DIAGNOSIS — M109 Gout, unspecified: Secondary | ICD-10-CM | POA: Diagnosis not present

## 2019-02-04 DIAGNOSIS — E7800 Pure hypercholesterolemia, unspecified: Secondary | ICD-10-CM | POA: Diagnosis not present

## 2019-02-04 DIAGNOSIS — E1122 Type 2 diabetes mellitus with diabetic chronic kidney disease: Secondary | ICD-10-CM | POA: Diagnosis not present

## 2019-02-04 DIAGNOSIS — E538 Deficiency of other specified B group vitamins: Secondary | ICD-10-CM | POA: Diagnosis not present

## 2019-02-04 DIAGNOSIS — I509 Heart failure, unspecified: Secondary | ICD-10-CM | POA: Diagnosis not present

## 2019-02-19 ENCOUNTER — Emergency Department (HOSPITAL_COMMUNITY): Payer: Medicare Other

## 2019-02-19 ENCOUNTER — Encounter (HOSPITAL_COMMUNITY): Payer: Self-pay | Admitting: Emergency Medicine

## 2019-02-19 ENCOUNTER — Other Ambulatory Visit: Payer: Self-pay

## 2019-02-19 ENCOUNTER — Inpatient Hospital Stay (HOSPITAL_COMMUNITY)
Admission: EM | Admit: 2019-02-19 | Discharge: 2019-02-21 | DRG: 291 | Disposition: A | Discharge status: Home / Self Care | Payer: Medicare Other | Attending: Student in an Organized Health Care Education/Training Program | Admitting: Student in an Organized Health Care Education/Training Program

## 2019-02-19 DIAGNOSIS — Z79891 Long term (current) use of opiate analgesic: Secondary | ICD-10-CM

## 2019-02-19 DIAGNOSIS — Z79899 Other long term (current) drug therapy: Secondary | ICD-10-CM

## 2019-02-19 DIAGNOSIS — E1122 Type 2 diabetes mellitus with diabetic chronic kidney disease: Secondary | ICD-10-CM | POA: Diagnosis present

## 2019-02-19 DIAGNOSIS — I700 Atherosclerosis of aorta: Secondary | ICD-10-CM | POA: Diagnosis present

## 2019-02-19 DIAGNOSIS — N179 Acute kidney failure, unspecified: Secondary | ICD-10-CM | POA: Diagnosis present

## 2019-02-19 DIAGNOSIS — N401 Enlarged prostate with lower urinary tract symptoms: Secondary | ICD-10-CM | POA: Diagnosis present

## 2019-02-19 DIAGNOSIS — Z20828 Contact with and (suspected) exposure to other viral communicable diseases: Secondary | ICD-10-CM | POA: Diagnosis present

## 2019-02-19 DIAGNOSIS — I10 Essential (primary) hypertension: Secondary | ICD-10-CM | POA: Diagnosis present

## 2019-02-19 DIAGNOSIS — I480 Paroxysmal atrial fibrillation: Secondary | ICD-10-CM

## 2019-02-19 DIAGNOSIS — E119 Type 2 diabetes mellitus without complications: Secondary | ICD-10-CM

## 2019-02-19 DIAGNOSIS — Z902 Acquired absence of lung [part of]: Secondary | ICD-10-CM | POA: Diagnosis not present

## 2019-02-19 DIAGNOSIS — E1142 Type 2 diabetes mellitus with diabetic polyneuropathy: Secondary | ICD-10-CM | POA: Diagnosis present

## 2019-02-19 DIAGNOSIS — I5042 Chronic combined systolic (congestive) and diastolic (congestive) heart failure: Secondary | ICD-10-CM | POA: Diagnosis not present

## 2019-02-19 DIAGNOSIS — I129 Hypertensive chronic kidney disease with stage 1 through stage 4 chronic kidney disease, or unspecified chronic kidney disease: Secondary | ICD-10-CM | POA: Diagnosis not present

## 2019-02-19 DIAGNOSIS — N184 Chronic kidney disease, stage 4 (severe): Secondary | ICD-10-CM

## 2019-02-19 DIAGNOSIS — E8770 Fluid overload, unspecified: Secondary | ICD-10-CM | POA: Diagnosis not present

## 2019-02-19 DIAGNOSIS — R0602 Shortness of breath: Secondary | ICD-10-CM | POA: Diagnosis not present

## 2019-02-19 DIAGNOSIS — I2510 Atherosclerotic heart disease of native coronary artery without angina pectoris: Secondary | ICD-10-CM | POA: Diagnosis not present

## 2019-02-19 DIAGNOSIS — I5043 Acute on chronic combined systolic (congestive) and diastolic (congestive) heart failure: Secondary | ICD-10-CM | POA: Diagnosis not present

## 2019-02-19 DIAGNOSIS — I5030 Unspecified diastolic (congestive) heart failure: Secondary | ICD-10-CM | POA: Diagnosis not present

## 2019-02-19 DIAGNOSIS — C884 Extranodal marginal zone B-cell lymphoma of mucosa-associated lymphoid tissue [MALT-lymphoma]: Secondary | ICD-10-CM | POA: Diagnosis not present

## 2019-02-19 DIAGNOSIS — I509 Heart failure, unspecified: Secondary | ICD-10-CM

## 2019-02-19 DIAGNOSIS — E785 Hyperlipidemia, unspecified: Secondary | ICD-10-CM | POA: Diagnosis not present

## 2019-02-19 DIAGNOSIS — G2581 Restless legs syndrome: Secondary | ICD-10-CM | POA: Diagnosis not present

## 2019-02-19 DIAGNOSIS — R350 Frequency of micturition: Secondary | ICD-10-CM | POA: Diagnosis not present

## 2019-02-19 DIAGNOSIS — N189 Chronic kidney disease, unspecified: Secondary | ICD-10-CM

## 2019-02-19 DIAGNOSIS — Z87891 Personal history of nicotine dependence: Secondary | ICD-10-CM | POA: Diagnosis not present

## 2019-02-19 DIAGNOSIS — Z794 Long term (current) use of insulin: Secondary | ICD-10-CM | POA: Diagnosis not present

## 2019-02-19 DIAGNOSIS — R600 Localized edema: Secondary | ICD-10-CM | POA: Diagnosis not present

## 2019-02-19 DIAGNOSIS — Z8572 Personal history of non-Hodgkin lymphomas: Secondary | ICD-10-CM

## 2019-02-19 DIAGNOSIS — I130 Hypertensive heart and chronic kidney disease with heart failure and stage 1 through stage 4 chronic kidney disease, or unspecified chronic kidney disease: Principal | ICD-10-CM | POA: Diagnosis present

## 2019-02-19 DIAGNOSIS — R7989 Other specified abnormal findings of blood chemistry: Secondary | ICD-10-CM

## 2019-02-19 DIAGNOSIS — G894 Chronic pain syndrome: Secondary | ICD-10-CM | POA: Diagnosis present

## 2019-02-19 DIAGNOSIS — I5020 Unspecified systolic (congestive) heart failure: Secondary | ICD-10-CM | POA: Diagnosis not present

## 2019-02-19 DIAGNOSIS — E559 Vitamin D deficiency, unspecified: Secondary | ICD-10-CM | POA: Diagnosis not present

## 2019-02-19 DIAGNOSIS — Z8249 Family history of ischemic heart disease and other diseases of the circulatory system: Secondary | ICD-10-CM

## 2019-02-19 DIAGNOSIS — R0600 Dyspnea, unspecified: Secondary | ICD-10-CM | POA: Diagnosis not present

## 2019-02-19 DIAGNOSIS — D631 Anemia in chronic kidney disease: Secondary | ICD-10-CM | POA: Diagnosis not present

## 2019-02-19 HISTORY — DX: Chronic kidney disease, stage 4 (severe): N18.4

## 2019-02-19 HISTORY — DX: Heart failure, unspecified: I50.9

## 2019-02-19 LAB — CBC WITH DIFFERENTIAL/PLATELET
Abs Immature Granulocytes: 0.12 10*3/uL — ABNORMAL HIGH (ref 0.00–0.07)
Basophils Absolute: 0.1 10*3/uL (ref 0.0–0.1)
Basophils Relative: 1 %
Eosinophils Absolute: 0.5 10*3/uL (ref 0.0–0.5)
Eosinophils Relative: 5 %
HCT: 25.7 % — ABNORMAL LOW (ref 39.0–52.0)
Hemoglobin: 8.3 g/dL — ABNORMAL LOW (ref 13.0–17.0)
Immature Granulocytes: 1 %
Lymphocytes Relative: 10 %
Lymphs Abs: 0.9 10*3/uL (ref 0.7–4.0)
MCH: 31.9 pg (ref 26.0–34.0)
MCHC: 32.3 g/dL (ref 30.0–36.0)
MCV: 98.8 fL (ref 80.0–100.0)
Monocytes Absolute: 0.7 10*3/uL (ref 0.1–1.0)
Monocytes Relative: 8 %
Neutro Abs: 6.5 10*3/uL (ref 1.7–7.7)
Neutrophils Relative %: 75 %
Platelets: 132 10*3/uL — ABNORMAL LOW (ref 150–400)
RBC: 2.6 MIL/uL — ABNORMAL LOW (ref 4.22–5.81)
RDW: 13.6 % (ref 11.5–15.5)
WBC: 8.6 10*3/uL (ref 4.0–10.5)
nRBC: 0 % (ref 0.0–0.2)

## 2019-02-19 LAB — PROTIME-INR
INR: 1.2 (ref 0.8–1.2)
Prothrombin Time: 15.2 seconds (ref 11.4–15.2)

## 2019-02-19 LAB — COMPREHENSIVE METABOLIC PANEL
ALT: 23 U/L (ref 0–44)
AST: 16 U/L (ref 15–41)
Albumin: 3.9 g/dL (ref 3.5–5.0)
Alkaline Phosphatase: 70 U/L (ref 38–126)
Anion gap: 9 (ref 5–15)
BUN: 42 mg/dL — ABNORMAL HIGH (ref 6–20)
CO2: 22 mmol/L (ref 22–32)
Calcium: 9.2 mg/dL (ref 8.9–10.3)
Chloride: 110 mmol/L (ref 98–111)
Creatinine, Ser: 3.14 mg/dL — ABNORMAL HIGH (ref 0.61–1.24)
GFR calc Af Amer: 24 mL/min — ABNORMAL LOW (ref >60)
GFR calc non Af Amer: 20 mL/min — ABNORMAL LOW (ref >60)
Glucose, Bld: 188 mg/dL — ABNORMAL HIGH (ref 70–99)
Potassium: 4.6 mmol/L (ref 3.5–5.1)
Sodium: 141 mmol/L (ref 135–145)
Total Bilirubin: 1.2 mg/dL (ref 0.3–1.2)
Total Protein: 7.1 g/dL (ref 6.5–8.1)

## 2019-02-19 LAB — HEMOGLOBIN A1C
Hgb A1c MFr Bld: 5 % (ref 4.8–5.6)
Mean Plasma Glucose: 96.8 mg/dL

## 2019-02-19 LAB — I-STAT CHEM 8, ED
BUN: 42 mg/dL — ABNORMAL HIGH (ref 6–20)
Calcium, Ion: 1.22 mmol/L (ref 1.15–1.40)
Chloride: 111 mmol/L (ref 98–111)
Creatinine, Ser: 3.2 mg/dL — ABNORMAL HIGH (ref 0.61–1.24)
Glucose, Bld: 186 mg/dL — ABNORMAL HIGH (ref 70–99)
HCT: 25 % — ABNORMAL LOW (ref 39.0–52.0)
Hemoglobin: 8.5 g/dL — ABNORMAL LOW (ref 13.0–17.0)
Potassium: 4.6 mmol/L (ref 3.5–5.1)
Sodium: 143 mmol/L (ref 135–145)
TCO2: 25 mmol/L (ref 22–32)

## 2019-02-19 LAB — GLUCOSE, CAPILLARY: Glucose-Capillary: 124 mg/dL — ABNORMAL HIGH (ref 70–99)

## 2019-02-19 LAB — LIPID PANEL
Cholesterol: 135 mg/dL (ref 0–200)
HDL: 29 mg/dL — ABNORMAL LOW (ref >40)
LDL Cholesterol: 86 mg/dL (ref 0–99)
Total CHOL/HDL Ratio: 4.7 RATIO
Triglycerides: 100 mg/dL (ref <150)
VLDL: 20 mg/dL (ref 0–40)

## 2019-02-19 LAB — TROPONIN I
Troponin I: 0.07 ng/mL (ref <0.03)
Troponin I: 0.06 ng/mL (ref <0.03)

## 2019-02-19 LAB — TSH: TSH: 0.297 u[IU]/mL — ABNORMAL LOW (ref 0.350–4.500)

## 2019-02-19 LAB — SARS CORONAVIRUS 2: SARS Coronavirus 2: NOT DETECTED

## 2019-02-19 LAB — BRAIN NATRIURETIC PEPTIDE: B Natriuretic Peptide: 307.9 pg/mL — ABNORMAL HIGH (ref 0.0–100.0)

## 2019-02-19 MED ORDER — SENNOSIDES-DOCUSATE SODIUM 8.6-50 MG PO TABS: 1.0000 | ORAL_TABLET | Freq: Every evening | ORAL | Status: DC | PRN

## 2019-02-19 MED ORDER — ISOSORBIDE MONONITRATE ER 30 MG PO TB24: 30.0000 mg | ORAL_TABLET | Freq: Every day | ORAL | Status: DC

## 2019-02-19 MED ORDER — AMIODARONE HCL 200 MG PO TABS
200.0000 mg | ORAL_TABLET | Freq: Every day | ORAL | Status: DC
  Administered 2019-02-20 – 2019-02-21 (×2): 200 mg via ORAL
  Filled 2019-02-19 (×2): qty 1

## 2019-02-19 MED ORDER — INSULIN ASPART 100 UNIT/ML ~~LOC~~ SOLN
0.0000 [IU] | Freq: Three times a day (TID) | SUBCUTANEOUS | Status: DC
  Administered 2019-02-19 – 2019-02-20 (×2): 2 [IU] via SUBCUTANEOUS
  Administered 2019-02-20 (×2): 3 [IU] via SUBCUTANEOUS

## 2019-02-19 MED ORDER — ASPIRIN EC 81 MG PO TBEC
81.0000 mg | DELAYED_RELEASE_TABLET | Freq: Every day | ORAL | Status: DC
  Administered 2019-02-19 – 2019-02-21 (×3): 81 mg via ORAL
  Filled 2019-02-19 (×3): qty 1

## 2019-02-19 MED ORDER — METOLAZONE 2.5 MG PO TABS
2.5000 mg | ORAL_TABLET | Freq: Every day | ORAL | Status: DC
  Administered 2019-02-19 – 2019-02-21 (×3): 2.5 mg via ORAL
  Filled 2019-02-19 (×3): qty 1

## 2019-02-19 MED ORDER — ACETAMINOPHEN 325 MG PO TABS: 650.0000 mg | ORAL_TABLET | Freq: Four times a day (QID) | ORAL | Status: DC | PRN

## 2019-02-19 MED ORDER — ATORVASTATIN CALCIUM 40 MG PO TABS
40.0000 mg | ORAL_TABLET | Freq: Every day | ORAL | Status: DC
  Administered 2019-02-19 – 2019-02-20 (×2): 40 mg via ORAL
  Filled 2019-02-19 (×2): qty 1

## 2019-02-19 MED ORDER — INSULIN GLARGINE 100 UNIT/ML ~~LOC~~ SOLN
20.0000 [IU] | Freq: Every day | SUBCUTANEOUS | Status: DC
  Filled 2019-02-19: qty 0.2

## 2019-02-19 MED ORDER — CARVEDILOL 6.25 MG PO TABS
6.2500 mg | ORAL_TABLET | Freq: Two times a day (BID) | ORAL | Status: DC
  Administered 2019-02-19 – 2019-02-21 (×4): 6.25 mg via ORAL
  Filled 2019-02-19 (×4): qty 1

## 2019-02-19 MED ORDER — ACETAMINOPHEN 650 MG RE SUPP: 650.0000 mg | Freq: Four times a day (QID) | RECTAL | Status: DC | PRN

## 2019-02-19 MED ORDER — FUROSEMIDE 10 MG/ML IJ SOLN
80.0000 mg | Freq: Three times a day (TID) | INTRAMUSCULAR | Status: DC
  Filled 2019-02-19: qty 8

## 2019-02-19 MED ORDER — TAMSULOSIN HCL 0.4 MG PO CAPS
0.4000 mg | ORAL_CAPSULE | Freq: Every day | ORAL | Status: DC
  Administered 2019-02-19 – 2019-02-20 (×2): 0.4 mg via ORAL
  Filled 2019-02-19 (×2): qty 1

## 2019-02-19 MED ORDER — ISOSORBIDE MONONITRATE ER 60 MG PO TB24
60.0000 mg | ORAL_TABLET | Freq: Every day | ORAL | Status: DC
  Administered 2019-02-19 – 2019-02-21 (×3): 60 mg via ORAL
  Filled 2019-02-19 (×3): qty 1

## 2019-02-19 MED ORDER — HYDRALAZINE HCL 25 MG PO TABS
100.0000 mg | ORAL_TABLET | Freq: Three times a day (TID) | ORAL | Status: DC
  Administered 2019-02-19 – 2019-02-21 (×6): 100 mg via ORAL
  Filled 2019-02-19 (×6): qty 4

## 2019-02-19 MED ORDER — INSULIN GLARGINE 100 UNIT/ML ~~LOC~~ SOLN
25.0000 [IU] | Freq: Every day | SUBCUTANEOUS | Status: DC
  Administered 2019-02-19 – 2019-02-20 (×2): 25 [IU] via SUBCUTANEOUS
  Filled 2019-02-19 (×3): qty 0.25

## 2019-02-19 MED ORDER — FUROSEMIDE 10 MG/ML IJ SOLN
80.0000 mg | Freq: Three times a day (TID) | INTRAMUSCULAR | Status: DC
  Administered 2019-02-19 – 2019-02-21 (×6): 80 mg via INTRAVENOUS
  Filled 2019-02-19 (×6): qty 8

## 2019-02-19 MED ORDER — HEPARIN SODIUM (PORCINE) 5000 UNIT/ML IJ SOLN
5000.0000 [IU] | Freq: Three times a day (TID) | INTRAMUSCULAR | Status: DC
  Administered 2019-02-20 – 2019-02-21 (×3): 5000 [IU] via SUBCUTANEOUS
  Filled 2019-02-19 (×4): qty 1

## 2019-02-19 MED ORDER — GABAPENTIN 600 MG PO TABS
600.0000 mg | ORAL_TABLET | Freq: Three times a day (TID) | ORAL | Status: DC
  Administered 2019-02-19 – 2019-02-21 (×5): 600 mg via ORAL
  Filled 2019-02-19 (×6): qty 1
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
## 2019-02-19 NOTE — ED Triage Notes (Signed)
Pt states just d/c form [facility] last THu and was told he had a lot of fluid on board , states has been sob and having cp

## 2019-02-19 NOTE — Progress Notes (Signed)
CRITICAL VALUE ALERT  Critical Value:  Troponin: 0.06  Date & Time Notied:  02/19/2019, 2012, nurse found  Provider Notified: Winf[place]  Orders Received/Actions taken: awaiting
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
## 2019-02-19 NOTE — H&P (Signed)
Date: 02/19/2019               Patient Name:  Curtis Mays MRN: [id number]  DOB: [birth year] Age / Sex: 58 y.o., male   PCP: Helen Hashimoto., MD         Medical Service: Internal Medicine Teaching Service         Attending Physician: Dr. Evette Doffing, Mallie Mussel, *    First Contact: Dr. Truman Hayward Pager: [phone]  Second Contact: Dr. Berline Lopes Pager: [phone]       After Hours (After 5p/  First Contact Pager: [phone]  weekends / holidays): Second Contact Pager: [phone]   Chief Complaint: Shortness of breath   History of Present Illness: Curtis Mays is a 58 yo M w/ PMH of HFrEF (EF 45-50%), paroxysmal A.fib/flutter s/p cardioversion, HTN, T2DM, hx of MALT lymphoma, who presents to the ER for orthopnea, dyspnea and pedal edema that is progressively worsened since his discharge from [facility] on February 08, 2019.  The patient stated that approximately 1 year prior he experienced similar symptoms where he was diagnosed with congestive heart failure.  His cardiologist has been uptitrating his furosemide as indicated and he has been stable for some time.  He endorses that approximately 1 month prior to this admission he began to experience increased shortness of breath with activity, orthopnea, and increasing swelling in his feet.  His nephrologist initially increased his Lasix dose to 120 mg p.o. daily from 80 mg.  This however, was insufficient and he was eventually hospitalized for approximately 1 week where he underwent diuresis with IV Lasix.  He feels that by his discharge his volume status had not changed significantly as compared to his admission and his symptoms were minimally improved. Over the past few weeks since his discharge, he is experienced over worsening of his symptoms to which his nephrologist advised that he presented to the [facility] emergency department.  In the ED i-STAT Chem-8 indicated a stable serum creatinine of approximately 3.2 which appears to be near his  baseline per care everywhere review.  BNP was minimally elevated to 308, troponin 0 0.07, CBC indicating a hemoglobin of 8.3. It is uncertain if this is his baseline.   Meds:  Current Meds  Medication Sig  . amiodarone (PACERONE) 200 MG tablet Take 200 mg by mouth daily.  . carvedilol (COREG) 6.25 MG tablet Take 6.25 mg by mouth 2 (two) times daily with a meal.  . gabapentin (NEURONTIN) 600 MG tablet Take 600 mg by mouth 3 (three) times daily.   . hydrALAZINE (APRESOLINE) 100 MG tablet Take 100 mg by mouth 3 (three) times daily.  . insulin aspart (NOVOLOG) 100 UNIT/ML injection Inject into the skin 3 (three) times daily before meals. 10units per 29 carbs/sliding scale  . insulin glargine (LANTUS) 100 UNIT/ML injection Inject 30-40 Units into the skin at bedtime.   . isosorbide mononitrate (IMDUR) 30 MG 24 hr tablet Take 30 mg by mouth daily.  Marland Kitchen oxyCODONE (OXY IR/ROXICODONE) 5 MG immediate release tablet Take 5 mg by mouth every 6 (six) hours as needed for severe pain (gout).  . Tamsulosin HCl (FLOMAX) 0.4 MG CAPS Take 0.4 mg by mouth daily after supper.   . torsemide (DEMADEX) 20 MG tablet Take 40 mg by mouth 2 (two) times daily.   Allergies: Allergies as of 02/19/2019  . (No Known Allergies)   Past Medical History:  Diagnosis Date  . Aortic calcification (HCC)   . Arthritis   .  CHF (congestive heart failure) ([place])   . Chronic pain syndrome   . CKD (chronic kidney disease), stage IV ([place])   . Diabetes mellitus   . DM2 (diabetes mellitus, type 2) ([place])    uncontrolled  . 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 HTN (hypertension)   . Hyperlipidemia   . 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 Neuropathy   . nhl dx'd 06/2011  . Obesity   . Pulmonary nodules    Right Lower lobe  . Restless leg syndrome   . Right thyroid nodule     Family History:  Son-Thyroid  cancer Father-hypertension  Social History:  Lives with wife Denied EtOH, illicit drugs or tobacco in >30 year  Review of Systems: A complete ROS was negative except as per HPI.   Physical Exam: Blood pressure (!) 149/66, pulse 74, temperature 98.2 F (36.8 C), temperature source Oral, resp. rate 20, SpO2 100 %. Gen: Well-developed, obese, NAD HEENT: NCAT head, hearing intact, MMM Neck: supple, ROM intact, + JVD CV: RRR, S1, S2 normal Pulm: Minimal bibasilar rales, no wheezing Abd: Soft, BS+, NTND Extm: ROM intact, Peripheral pulses intact, 2+ pitting edema up to knees Skin: Dry, Warm, normal turgor, multiple areas of ecchymosis on bilateral upper extremities  Neuro: AAOx3  EKG: personally reviewed my interpretation is NSR, no ST depression/elevations consistent with MI, minimal change since prior tracing in 2018.  CXR: personally reviewed my interpretation is no focal consolidation but increased interstitial edema, there is blunting of the costophrenic angles bilaterally.  Assessment & Plan by Problem: Principal Problem:   CKD (chronic kidney disease), stage IV (HCC) Active Problems:   DM2 (diabetes mellitus, type 2) (HCC)   HTN (hypertension)   Acute CHF (congestive heart failure) (HCC)  Assessment and Plan: Curtis Mays is a 58 yo M w/ PMH of HFrEF (EF 45-50%), paroxysmal A.fib/flutter s/p cardioversion, HTN, T2DM, hx of MALT lymphoma, who presents to the ER for orthopnea, dyspnea and pedal edema that is progressively worsened since his discharge from [facility] on February 08, 2019. The patients exam is concerning for volume overload likely multifactorial but likely 2/2 to progressive CKDIV and HFrEF.  Patient is being admitted for diuresis and further evaluation with CKD and heart failure.  CKDIV: Stable as per chart review of care everywhere. Likely 2/2 to long standing poorly controlled HTN.  Nephrology was consulted in the ED.  They recommended Lasix 80 mg IV 3 times  daily metolazone 2.5 mg daily.  He may need to consider the possibility that he is nearing end-stage renal disease for which he should undergo evaluation for fistula placement while present for this admission. -Agree with Lasix 80 mg IV 3 times daily -Agree with metolazone 2.5 mg daily - Appreciate nephrology's recommendations  CHF: Most recent echo from care for were indicating LVEF of 45 to 50% no other notable or valvular abnormalities.  Given patient's symptoms and BNP of less than 400 I feel a cardiac component is less likely the primary etiology for his volume overload.  He does have JVD, abdominal distention and significant lower extremity edema on exam which is consistent with volume overload from either source.  Weight appears to be stable over the past 3 to 4 weeks. - Agree with Coreg 6.25 mg twice daily -Agree hydralazine under milligrams 3 times daily - Cardiology increased Imdur to 60 mg daily in addition to hydralazine, with caution excessive effect  on blood pressure and patient setting - Strict I's and O's, daily weights- BMP daily  Troponemia: Slightly elevated troponin to 0.07 but consistent with increased demand most likely from CHF and CKD given his lack of ACS symptoms and unremarkable EKG. Will trend to determine peak. -Trend troponins  DMII: Hgb A1c 5.0% on admission.  History of peripheral neuropathy thought to be secondary to longstanding diabetes.  At home the patient is on Lantus 30 to 40 units nightly and NovoLog 10 units 3 times daily. - We will monitor use SSI moderate -3 times daily with meals CBG monitoring -Lantus 25 units nightly -Continue gabapentin 600 mg 3 times daily  HTN: BP elevated on admission.  We will continue the patient's home antihypertensives, which cardiology has increased. - Continue hydralazine increased 200 mg 3 times daily -Continue carvedilol 6.25 mg twice daily -Continue Imdur 60 mg daily  History of BPH: Patient denied urinary  retention. -Continue tamsulosin daily  Diet: 2 g sodium Code: Full Fluids: N/A DVT PPX: Enoxaparin Dispo: Admit patient to Inpatient with expected length of stay greater than 2 midnights.  Signed: Kathi Ludwig, MD 02/19/2019, 4:20 PM  Pager: Pager# [phone]
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
## 2019-02-20 ENCOUNTER — Inpatient Hospital Stay (HOSPITAL_COMMUNITY): Payer: Medicare Other

## 2019-02-20 ENCOUNTER — Other Ambulatory Visit: Payer: Self-pay

## 2019-02-20 DIAGNOSIS — I130 Hypertensive heart and chronic kidney disease with heart failure and stage 1 through stage 4 chronic kidney disease, or unspecified chronic kidney disease: Principal | ICD-10-CM

## 2019-02-20 DIAGNOSIS — I5020 Unspecified systolic (congestive) heart failure: Secondary | ICD-10-CM

## 2019-02-20 DIAGNOSIS — C884 Extranodal marginal zone B-cell lymphoma of mucosa-associated lymphoid tissue [MALT-lymphoma]: Secondary | ICD-10-CM

## 2019-02-20 DIAGNOSIS — I480 Paroxysmal atrial fibrillation: Secondary | ICD-10-CM

## 2019-02-20 DIAGNOSIS — Z79899 Other long term (current) drug therapy: Secondary | ICD-10-CM

## 2019-02-20 DIAGNOSIS — E8770 Fluid overload, unspecified: Secondary | ICD-10-CM

## 2019-02-20 DIAGNOSIS — R0600 Dyspnea, unspecified: Secondary | ICD-10-CM

## 2019-02-20 DIAGNOSIS — Z794 Long term (current) use of insulin: Secondary | ICD-10-CM

## 2019-02-20 DIAGNOSIS — E1122 Type 2 diabetes mellitus with diabetic chronic kidney disease: Secondary | ICD-10-CM

## 2019-02-20 HISTORY — DX: Paroxysmal atrial fibrillation: I48.0

## 2019-02-20 LAB — GLUCOSE, CAPILLARY
Glucose-Capillary: 165 mg/dL — ABNORMAL HIGH (ref 70–99)
Glucose-Capillary: 131 mg/dL — ABNORMAL HIGH (ref 70–99)
Glucose-Capillary: 177 mg/dL — ABNORMAL HIGH (ref 70–99)
Glucose-Capillary: 178 mg/dL — ABNORMAL HIGH (ref 70–99)
Glucose-Capillary: 151 mg/dL — ABNORMAL HIGH (ref 70–99)

## 2019-02-20 LAB — BASIC METABOLIC PANEL
Anion gap: 10 (ref 5–15)
BUN: 45 mg/dL — ABNORMAL HIGH (ref 6–20)
CO2: 23 mmol/L (ref 22–32)
Calcium: 9.2 mg/dL (ref 8.9–10.3)
Chloride: 109 mmol/L (ref 98–111)
Creatinine, Ser: 3.38 mg/dL — ABNORMAL HIGH (ref 0.61–1.24)
GFR calc Af Amer: 22 mL/min — ABNORMAL LOW (ref >60)
GFR calc non Af Amer: 19 mL/min — ABNORMAL LOW (ref >60)
Glucose, Bld: 133 mg/dL — ABNORMAL HIGH (ref 70–99)
Potassium: 4.1 mmol/L (ref 3.5–5.1)
Sodium: 142 mmol/L (ref 135–145)

## 2019-02-20 LAB — NM MYOCAR MULTI W/SPECT W/WALL MOTION / EF
Estimated workload: 1 METS
Exercise duration (min): 5 min
MPHR: 160 {beats}/min
Peak HR: 85 {beats}/min
Percent HR: 53 %
Rest HR: 85 {beats}/min

## 2019-02-20 LAB — HIV ANTIBODY (ROUTINE TESTING W REFLEX): HIV Screen 4th Generation wRfx: NONREACTIVE

## 2019-02-20 LAB — TROPONIN I: Troponin I: 0.05 ng/mL (ref <0.03)

## 2019-02-20 LAB — MAGNESIUM: Magnesium: 2.2 mg/dL (ref 1.7–2.4)

## 2019-02-20 MED ORDER — REGADENOSON 0.4 MG/5ML IV SOLN
0.4000 mg | Freq: Once | INTRAVENOUS | Status: AC
  Administered 2019-02-20: 0.4 mg via INTRAVENOUS
  Filled 2019-02-20: qty 5

## 2019-02-20 MED ORDER — TECHNETIUM TC 99M TETROFOSMIN IV KIT
30.0000 | PACK | Freq: Once | INTRAVENOUS | Status: AC | PRN
  Administered 2019-02-20: 30 via INTRAVENOUS

## 2019-02-20 MED ORDER — REGADENOSON 0.4 MG/5ML IV SOLN
INTRAVENOUS | Status: AC
  Filled 2019-02-20: qty 5

## 2019-02-20 MED ORDER — TECHNETIUM TC 99M TETROFOSMIN IV KIT
10.0000 | PACK | Freq: Once | INTRAVENOUS | Status: AC | PRN
  Administered 2019-02-20: 10 via INTRAVENOUS

## 2019-02-20 NOTE — Progress Notes (Signed)
Date: 02/20/2019  Patient name: Curtis Mays  Medical record number: [id number]  Date of birth: Dec 16, 1960   I have seen and evaluated Curtis Mays and discussed their care with the Residency Team.  In brief, patient is a 58 year old male with a past medical history of heart failure with reduced ejection fraction (EF 45 to 50%), paroxysmal A. fib/flutter status post cardioversion, hypertension, type 2 diabetes, MALT lymphoma who presented to the ED with worsening shortness of breath and lower extremity edema over the last week.  Patient states that he was recently discharged from [facility] on February 08, 2019 for CHF exacerbation.  He has been titrating up his furosemide as an outpatient and was up to 120 mg orally daily.  Despite the titration up of his furosemide patient developed progressively worsening shortness of breath and lower extremity edema and was admitted to [facility] where he stayed for approximately 1 week and underwent diuresis with IV Lasix.  Since his discharge patient noted recurrence of lower extremity edema as well as worsening shortness of breath and spoke to his nephrologist who advised the patient to come to the ED for further evaluation.  No chest pain, no palpitations, diaphoresis, no lightheadedness, no syncope, no focal weakness, tingling or numbness, no fevers or chills, no nausea or vomiting, no diarrhea, no abdominal pain.  Today patient states that his shortness of breath is slowly improving and his lower extremity swelling is improving as well.  PMHx, Fam Hx, and/or Soc Hx : As per resident admit note  Vitals:   02/20/19 1006 02/20/19 1102  BP: (!) 118/54 124/62  Pulse:  82  Resp:  18  Temp:  98.1 F (36.7 C)  SpO2:  99%   Physical Exam  Constitutional: He is oriented to person, place, and time and well-developed, well-nourished, and in no distress.  HENT:  Head: Normocephalic and atraumatic.  Eyes: Right eye exhibits no discharge. Left  eye exhibits no discharge.  Cardiovascular: Normal rate, regular rhythm and normal heart sounds.  Pulmonary/Chest: Effort normal and breath sounds normal. No respiratory distress. He has no wheezes.  Abdominal: Soft. Bowel sounds are normal. He exhibits no distension. There is no abdominal tenderness.  Musculoskeletal:        General: No tenderness.     Comments: Patient noted to have 2+ bilateral lower extremity pitting edema extending to the knee  Neurological: He is alert and oriented to person, place, and time.  Skin: Skin is warm and dry.  Psychiatric: Mood and affect normal.    Assessment and Plan: I have seen and evaluated the patient as outlined above. I agree with the formulated Assessment and Plan as detailed in the residents' note, with the following changes:   1.  Fluid overload secondary to CKD stage IV: -Patient admitted to the hospital with worsening shortness of breath, orthopnea, dyspnea on exertion and lower extremity edema in setting of a history of CKD.  Patient also has a history of heart failure with reduced ejection fraction but his last echo showed normal LVEF and no diastolic dysfunction.  I suspect that his fluid overload currently is secondary to his CKD stage IV -We will continue with IV Lasix 80 mg IV 3 times daily -Patient is net negative approximately 2.3 L over the last 24 hours.  Continue strict I&O's and daily weights -Patient's baseline creatinine appears to be approximately 3.3 per nephrology notes.  Patient is currently at his baseline. -Cardiology follow-up and recommendations appreciated -We  will continue with carvedilol, hydralazine and Imdur -Patient was noted to have a mildly elevated troponin up to 0.07.  Patient is status post nuclear stress test today which was low risk -No further work-up at this time -We will check BMP daily -Patient will likely be [place] home in the next 2 to 3 days once he is adequately diuresed  Aldine Contes,  MD 6/20/20202:24 PM

## 2019-02-20 NOTE — Progress Notes (Signed)
   Subjective: The patient had just returned from his stress test today upon entering to room where we found him sitting upright in bed. He denied dyspnea and stated that he had been able to lie flat for the procedure without orthopnea. He acute concerns or pain and was in agreement with the plan to proceed with continued diuresis.   Objective:  Vital signs in last 24 hours: Vitals:   02/19/19 2122 02/20/19 0000 02/20/19 0509 02/20/19 0511  BP: 135/61 (!) 138/58 (!) 130/55   Pulse: 74 78 70   Resp: 16 16 18    Temp: 98.3 F (36.8 C) 99.1 F (37.3 C) 99.4 F (37.4 C)   TempSrc: Oral Oral Oral   SpO2: 97% 92% 99%   Weight:    104.3 kg  Height:       General: A/O x4, in no acute distress, afebrile, nondiaphoretic Cardio: RRR, no mrg's, JVD present, +1 pitting edema to the knees bilaterally Pulmonary: CTA bilaterally, no wheezing or crackles  Abdomen: Bowel sounds normal, soft, nontender  MSK: BLE nontender Psych: Appropriate affect, not depressed in appearance, engages well  Assessment/Plan:  Principal Problem:   CKD (chronic kidney disease), stage IV (HCC) Active Problems:   DM2 (diabetes mellitus, type 2) (HCC)   HTN (hypertension)   Acute CHF (congestive heart failure) ([place])  Patient encounter summary: Curtis Mays is a 58 yo M w/ PMH of HFrEF (EF 45-50%), paroxysmal A.fib/flutter s/p cardioversion, HTN, T2DM, hx of MALT lymphoma, who presents to the ER for orthopnea, dyspnea and pedal edema that is progressively worsened since his discharge from [facility] on February 08, 2019. The patients exam is concerning for volume overload likely multifactorial but likely 2/2 to progressive CKDIV and HFrEF.  Patient was admitted and underwent aggressive IV diuresis.  A/P: CKD stage IV: Creatinine elevated slightly to 3.38 today from 3.20.  Likely secondary to high-dose IV diuretics.  Patient has been discussing possibility of transplant versus dialysis with nephrology. The patient is  down ~3L since admission.  -Continue strict I's and O's -Continue daily weights -Appreciate nephrology recommendations -Trend BMPs daily -Continue Lasix 80 mg IV 3 times daily -Continue metolazone to 5 mg daily  HFrEF: Likely only partially contributing to patient's excessive volume status and not predominantly so.  Patient continues to have evidence of hypervolemia with pitting edema lower extremities and JVD.  Dyspnea is improved however.  Patient underwent nuclear medicine myocardial exam today with results pending - Continue carvedilol 6.25 mg twice daily -Continue hydralazine 1 to milligrams 3 times daily - Continue Imdur 60 mg daily  Hx of Paroxysmal atrial fibrillation: Patient has remained in NSR this admission.  Continue home medications.  Diabetes mellitus type 2: CBGs indicate stable glucose control with current insulin regimen -Continue Lantus 25 units QHS -Continue SSI moderate  Dispo: Anticipated discharge in approximately 2-3 day(s).   Kathi Ludwig, MD 02/20/2019, 7:23 AM Pager: Pager# [phone]

## 2019-02-20 NOTE — Plan of Care (Signed)

## 2019-02-20 NOTE — Progress Notes (Signed)
Lexiscan complete. Patient denies any complaints. Patient offered snack and beverage. Patient taken back to [facility] to complete testing.

## 2019-02-20 NOTE — Progress Notes (Signed)
Progress Note  Patient Name: Curtis Mays Date of Encounter: 02/20/2019  Primary Cardiologist: No primary care provider on file.   Subjective   "The water poured out of me."  Inpatient Medications    Scheduled Meds: . amiodarone  200 mg Oral Daily  . aspirin EC  81 mg Oral Daily  . atorvastatin  40 mg Oral q1800  . carvedilol  6.25 mg Oral BID WC  . furosemide  80 mg Intravenous TID  . gabapentin  600 mg Oral TID  . heparin  5,000 Units Subcutaneous Q8H  . hydrALAZINE  100 mg Oral TID  . insulin aspart  0-15 Units Subcutaneous TID WC  . insulin glargine  25 Units Subcutaneous QHS  . isosorbide mononitrate  60 mg Oral Daily  . metolazone  2.5 mg Oral Daily  . regadenoson      . tamsulosin  0.4 mg Oral QPC supper   Continuous Infusions:  PRN Meds: acetaminophen **OR** acetaminophen, senna-docusate   Vital Signs    Vitals:   02/20/19 1004 02/20/19 1005 02/20/19 1006 02/20/19 1102  BP: (!) 123/51 (!) 117/52 (!) 118/54 124/62  Pulse:    82  Resp:    18  Temp:    98.1 F (36.7 C)  TempSrc:    Oral  SpO2:    99%  Weight:      Height:        Intake/Output Summary (Last 24 hours) at 02/20/2019 1242 Last data filed at 02/20/2019 1232 Gross per 24 hour  Intake 410 ml  Output 3400 ml  Net -2990 ml   Filed Weights   02/19/19 1449 02/20/19 0511  Weight: 107.4 kg 104.3 kg    Telemetry    nsr - Personally Reviewed  ECG    noen - Personally Reviewed  Physical Exam   GEN: No acute distress.   Neck: 7 cm JVD Cardiac: RRR Respiratory: Clear to auscultation bilaterally. GI:  non-distended  MS: No edema; No deformity. Neuro:  Nonfocal  Psych: Normal affect   Labs    Chemistry Recent Labs  Lab 02/19/19 1119 02/19/19 1152 02/20/19 0449  NA 141 143 142  K 4.6 4.6 4.1  CL 110 111 109  CO2 22  --  23  GLUCOSE 188* 186* 133*  BUN 42* 42* 45*  CREATININE 3.14* 3.20* 3.38*  CALCIUM 9.2  --  9.2  PROT 7.1  --   --   ALBUMIN 3.9  --   --   AST 16   --   --   ALT 23  --   --   ALKPHOS 70  --   --   BILITOT 1.2  --   --   GFRNONAA 20*  --  19*  GFRAA 24*  --  22*  ANIONGAP 9  --  10     Hematology Recent Labs  Lab 02/19/19 1119 02/19/19 1152  WBC 8.6  --   RBC 2.60*  --   HGB 8.3* 8.5*  HCT 25.7* 25.0*  MCV 98.8  --   MCH 31.9  --   MCHC 32.3  --   RDW 13.6  --   PLT 132*  --     Cardiac Enzymes Recent Labs  Lab 02/19/19 1119 02/19/19 1903 02/20/19 0003  TROPONINI 0.07* 0.06* 0.05*   No results for input(s): TROPIPOC in the last 168 hours.   BNP Recent Labs  Lab 02/19/19 1119  BNP 307.9*     DDimer No results for  input(s): DDIMER in the last 168 hours.   Radiology    Dg Chest Port 1 View  Result Date: 02/19/2019 CLINICAL DATA:  Shortness of breath. EXAM: PORTABLE CHEST 1 VIEW COMPARISON:  02/08/2019. FINDINGS: Stable enlarged cardiac silhouette. Decreased prominence of the pulmonary vasculature and interstitial markings. Small bilateral pleural effusions with improvement. Thoracic spine degenerative changes. Diffuse osteopenia. Cervical spine fixation hardware. IMPRESSION: Improving changes of congestive heart failure. Electronically Signed   By: Claudie Revering M.D.   On: 02/19/2019 12:09    Cardiac Studies   2D echo reviewed  Patient Profile     58 y.o. male admitted with volume overload and chronic renal failure, stage 4.  Assessment & Plan    1. Acute on chronic systolic/diastolic heart failure - his weight is down 7 lbs. He is not too far from euvolemic. We will continue IV diuresis today. 2. Elevated troponin - his stress test is pending 3. PAF - he is maintaining NSR on amiodarone. Continue.     For questions or updates, please contact [place] Please consult [email or web address] for contact info under Cardiology/STEMI.      Signed, Cristopher Peru, MD  02/20/2019, 12:42 PM  Patient ID: Curtis Mays, male   DOB: July 28, 1961, 58 y.o.   MRN: [id number]

## 2019-02-20 NOTE — Progress Notes (Signed)
[facility]    NEPHROLOGY PROGRESS NOTE  SUBJECTIVE: Patient seen and examined.  Reports feeling better today.  Denies chest pain, shortness of breath, nausea, vomiting, diarrhea or dysuria.  Reports frequent urination.  Lower extremity edema is improving.  All other review of systems are negative.  OBJECTIVE:  Vitals:   02/20/19 1006 02/20/19 1102  BP: (!) 118/54 124/62  Pulse:  82  Resp:  18  Temp:  98.1 F (36.7 C)  SpO2:  99%    Intake/Output Summary (Last 24 hours) at 02/20/2019 1519 Last data filed at 02/20/2019 1446 Gross per 24 hour  Intake 770 ml  Output 3800 ml  Net -3030 ml      General:  AAOx3 NAD HEENT: MMM [place] AT anicteric sclera Neck:  No JVD, no adenopathy CV:  Heart RRR  Lungs:  L/S CTA bilaterally Abd:  abd SNT/ND with normal BS GU:  Bladder non-palpable Extremities:   +1 bilateral LE edema. Skin:  No skin rash Psych:  normal mood and affect Neuro:  no focal deficits  MEDICATIONS:  . amiodarone  200 mg Oral Daily  . aspirin EC  81 mg Oral Daily  . atorvastatin  40 mg Oral q1800  . carvedilol  6.25 mg Oral BID WC  . furosemide  80 mg Intravenous TID  . gabapentin  600 mg Oral TID  . heparin  5,000 Units Subcutaneous Q8H  . hydrALAZINE  100 mg Oral TID  . insulin aspart  0-15 Units Subcutaneous TID WC  . insulin glargine  25 Units Subcutaneous QHS  . isosorbide mononitrate  60 mg Oral Daily  . metolazone  2.5 mg Oral Daily  . regadenoson      . tamsulosin  0.4 mg Oral QPC supper       LABS:   CBC Latest Ref Rng & Units 02/19/2019 02/19/2019 09/28/2014  WBC 4.0 - 10.5 K/uL - 8.6 8.2  Hemoglobin 13.0 - 17.0 g/dL 8.5(L) 8.3(L) 12.2(L)  Hematocrit 39.0 - 52.0 % 25.0(L) 25.7(L) 34.1(L)  Platelets 150 - 400 K/uL - 132(L) 163    CMP Latest Ref Rng & Units 02/20/2019 02/19/2019 02/19/2019  Glucose 70 - 99 mg/dL 133(H) 186(H) 188(H)  BUN 6 - 20 mg/dL 45(H) 42(H) 42(H)  Creatinine 0.61 - 1.24 mg/dL 3.38(H) 3.20(H) 3.14(H)  Sodium 135  - 145 mmol/L 142 143 141  Potassium 3.5 - 5.1 mmol/L 4.1 4.6 4.6  Chloride 98 - 111 mmol/L 109 111 110  CO2 22 - 32 mmol/L 23 - 22  Calcium 8.9 - 10.3 mg/dL 9.2 - 9.2  Total Protein 6.5 - 8.1 g/dL - - 7.1  Total Bilirubin 0.3 - 1.2 mg/dL - - 1.2  Alkaline Phos 38 - 126 U/L - - 70  AST 15 - 41 U/L - - 16  ALT 0 - 44 U/L - - 23    Lab Results  Component Value Date   CALCIUM 9.2 02/20/2019   CAION 1.22 02/19/2019       Component Value Date/Time   COLORURINE AMBER (A) 08/11/2011 1931   APPEARANCEUR CLOUDY (A) 08/11/2011 1931   LABSPEC 1.031 (H) 08/11/2011 1931   PHURINE 5.0 08/11/2011 1931   GLUCOSEU 250 (A) 08/11/2011 1931   HGBUR TRACE (A) 08/11/2011 1931   BILIRUBINUR SMALL (A) 08/11/2011 1931   KETONESUR NEGATIVE 08/11/2011 1931   PROTEINUR >300 (A) 08/11/2011 1931   UROBILINOGEN 1.0 08/11/2011 1931   NITRITE NEGATIVE 08/11/2011 1931   LEUKOCYTESUR TRACE (A) 08/11/2011 1931  Component Value Date/Time   PHART 7.350 06/25/2011 0351   PCO2ART 52.8 (H) 06/25/2011 0351   PO2ART 94.0 06/25/2011 0351   HCO3 29.0 (H) 06/25/2011 0351   TCO2 25 02/19/2019 1152   ACIDBASEDEF 1.0 06/24/2011 1055   O2SAT 97.0 06/25/2011 0351    No results found for: IRON, TIBC, FERRITIN, IRONPCTSAT     ASSESSMENT/PLAN:    The patient is a 58 y.o. year old male patient with a past medical history significant for chronic combined congestive heart failure, paroxysmal atrial fibrillation, type 2 diabetes, lymphoma, and stage IV CKD who presents for increasing shortness of breath and lower extremity edema.   1.  Chronic kidney disease stage IV.  His baseline serum creatinine appears to be around 3.3 most recently.  He had around 1.2 g of proteinuria earlier this month, attributed to underlying diabetic nephropathy.  Renal ultrasound from January 2020 was negative for obstruction.  SPEP is pending for completeness.  2.  Anasarca.  We will recheck a urine protein to creatinine ratio.  Agree with  diuresis with IV furosemide.  We will continue metolazone 2.5 mg once daily.  Cardiology is following.  3.  History of chronic systolic congestive heart failure.  His EF has improved to 50 to 55% on the most recent reading with borderline diastolic dysfunction.     [place], DO, [place]

## 2019-02-20 NOTE — Progress Notes (Addendum)
Progress Note  Patient Name: Curtis Mays Date of Encounter: 02/20/2019  Primary Cardiologist: Dr. Bishop Limbo [facility])  Subjective   Feeling ok this morning. No CP or dyspnea. Tolerated stress test ok.   Inpatient Medications    Scheduled Meds: . amiodarone  200 mg Oral Daily  . aspirin EC  81 mg Oral Daily  . atorvastatin  40 mg Oral q1800  . carvedilol  6.25 mg Oral BID WC  . furosemide  80 mg Intravenous TID  . gabapentin  600 mg Oral TID  . heparin  5,000 Units Subcutaneous Q8H  . hydrALAZINE  100 mg Oral TID  . insulin aspart  0-15 Units Subcutaneous TID WC  . insulin glargine  25 Units Subcutaneous QHS  . isosorbide mononitrate  60 mg Oral Daily  . metolazone  2.5 mg Oral Daily  . regadenoson      . tamsulosin  0.4 mg Oral QPC supper   Continuous Infusions:  PRN Meds: acetaminophen **OR** acetaminophen, senna-docusate   Vital Signs    Vitals:   02/20/19 0509 02/20/19 0511 02/20/19 0752 02/20/19 0925  BP: (!) 130/55  (!) 130/58 (!) 150/76  Pulse: 70  70   Resp: 18  17   Temp: 99.4 F (37.4 C)  98.2 F (36.8 C)   TempSrc: Oral  Oral   SpO2: 99%  100%   Weight:  104.3 kg    Height:        Intake/Output Summary (Last 24 hours) at 02/20/2019 1000 Last data filed at 02/20/2019 0753 Gross per 24 hour  Intake 360 ml  Output 3050 ml  Net -2690 ml   Last 3 Weights 02/20/2019 02/19/2019 09/28/2014  Weight (lbs) 229 lb 15 oz 236 lb 12.4 oz 260 lb 3.2 oz  Weight (kg) 104.3 kg 107.4 kg 118.026 kg      Telemetry    NSR - Personally Reviewed  ECG    NSR w/ anterolateral ST abnormalities - Personally Reviewed  Physical Exam   GEN: moderately obese WM in No acute distress.   Neck: No JVD Cardiac: RRR, no murmurs, rubs, or gallops.  Respiratory: Clear to auscultation bilaterally. GI: Soft, nontender, non-distended  MS: 2+ bilateral LE edema; No deformity. Neuro:  Nonfocal  Psych: Normal affect   Labs    Chemistry Recent Labs  Lab 02/19/19 1119  02/19/19 1152 02/20/19 0449  NA 141 143 142  K 4.6 4.6 4.1  CL 110 111 109  CO2 22  --  23  GLUCOSE 188* 186* 133*  BUN 42* 42* 45*  CREATININE 3.14* 3.20* 3.38*  CALCIUM 9.2  --  9.2  PROT 7.1  --   --   ALBUMIN 3.9  --   --   AST 16  --   --   ALT 23  --   --   ALKPHOS 70  --   --   BILITOT 1.2  --   --   GFRNONAA 20*  --  19*  GFRAA 24*  --  22*  ANIONGAP 9  --  10     Hematology Recent Labs  Lab 02/19/19 1119 02/19/19 1152  WBC 8.6  --   RBC 2.60*  --   HGB 8.3* 8.5*  HCT 25.7* 25.0*  MCV 98.8  --   MCH 31.9  --   MCHC 32.3  --   RDW 13.6  --   PLT 132*  --     Cardiac Enzymes Recent Labs  Lab 02/19/19  1119 02/19/19 1903 02/20/19 0003  TROPONINI 0.07* 0.06* 0.05*   No results for input(s): TROPIPOC in the last 168 hours.   BNP Recent Labs  Lab 02/19/19 1119  BNP 307.9*     DDimer No results for input(s): DDIMER in the last 168 hours.   Radiology    Dg Chest Port 1 View  Result Date: 02/19/2019 CLINICAL DATA:  Shortness of breath. EXAM: PORTABLE CHEST 1 VIEW COMPARISON:  02/08/2019. FINDINGS: Stable enlarged cardiac silhouette. Decreased prominence of the pulmonary vasculature and interstitial markings. Small bilateral pleural effusions with improvement. Thoracic spine degenerative changes. Diffuse osteopenia. Cervical spine fixation hardware. IMPRESSION: Improving changes of congestive heart failure. Electronically Signed   By: Claudie Revering M.D.   On: 02/19/2019 12:09    Cardiac Studies   NST 02/20/19 - pending   Echocardiogram 02/05/2019 [facility]): Summary: - Mildly dilated LV chamber. - Low-Normal LV systolic function with no appreciable segmental abnormality. - Ejection fraction is visually estimated at 50-55%. - Normal left ventricular wall thickness. - Diastolic function is borderline (delayed relaxation only), with no evidence of diastolic heart failure. - Normal right ventricle structure and function. - No significant valvular  stenosis or insufficiency. - IVC is dilated and does not collapse. - In comparison to the echo from 07/2018, there is no significant change.  Patient Profile     Curtis Mays is a 58 y.o. male with a history of chronic combined CHF, paroxysmal atrial fibrillation/flutter on Amiodarone, hypertension, type 2 diabetes mellitus, lymphoma, CKD stage IV, and obesity, who is being seen today for the evaluation of acute on chronic CHF at the request of Dr. Darl Householder (Emergency Department).  Assessment & Plan    1. Chronic Combined CHF - Patient recently admitted to outside hospital from 02/04/2019 to 02/10/2019 for acute CHF but no improvement of symptoms following discharge. Came to [facility] yesterday for persistent dyspnea and LEE. Chest x-ray in the ED showed improving CHF. BNP mildly elevated in the 300's. His recent echo at  [facility] showed normal LVEF and no diastolic dysfunction. ? If volume overload primarily related to his CKD vs primary cardiac etiology. He remains volume overloaded on exam w/ significant bilateral LE pitting edema and exertional dyspnea. No significant resting dyspnea, currently. Nephrology managing diuretics. Started on Lasix 80mg  IV three times daily and Metolazone 2.5mg  daily. -2.7 L out yesterday. Net I/Os -2.6 L total since admit. Continue diuretics per nephrology. Continue Coreg 6.25mg  twice daily and Hydralazine 100mg  three times daily (on 50mg  at home). Imdur was increased yesterday from 30mg  to 60mg  daily. Continue daily weights, strict I/O's, and renal function.  Low salt diet.   2. Elevated Troponin - 0.07>>0.06>>0.05. Had an episode of chest pain 2 nights prior to admit with orthopnea. No other episodes of chest pain. Currently pain free. EKG abnormal w/ inferior ST abnormalties. Has multiple cardiac risk factors. Getting NST done today to r/o coronary ischemia.   3. Paroxysmal Atrial Fibrillation - Patient has history of atrial fibrillation/flutter. Patient states he underwent a  cardioversion 8-9 months ago and has not been on any anticoagulation since then. - Maintaining sinus rhythm on telemetry. - Continue home Amiodarone 200mg  daily. - Continue Coreg as above. - CHA2DS-VASc = at least 3(CHF, HTN, DM). Patient should probably be on long-term anticoagulation for stroke prevention. However, will defer to primary Cardiologist at [facility]  5. Hypertension - moderately elevated during stress test in the 035K systolic. BP has improved on most recent vital signs check, at  124/62. - BP is managed by Nephrology as an outpatient. He is on Coreg, hydralazine and Imdur.   6. Type 2 Diabetes Mellitus - Management per primary team.  7. CKD Stage IV - Serum creatinine 3.14 on admission. 3.38 today - Followed by Nephrology as outpatient. They have also been consulted this admit and managing diuretics.  Previously has had talks about kidney transplant. - Further Management per nephrology.     For questions or updates, please contact [place] Please consult [email or web address] for contact info under        Signed, Lyda Jester, PA-C  02/20/2019, 10:00 AM    Cardiology Attending  Agree with above. See my separate note as well. He admits to sodium indiscretion and I have strongly encouraged the patient not to eat salty foods.  Mikle Bosworth.D.

## 2019-02-21 LAB — BASIC METABOLIC PANEL
Anion gap: 13 (ref 5–15)
BUN: 47 mg/dL — ABNORMAL HIGH (ref 6–20)
CO2: 25 mmol/L (ref 22–32)
Calcium: 9.3 mg/dL (ref 8.9–10.3)
Chloride: 102 mmol/L (ref 98–111)
Creatinine, Ser: 3.63 mg/dL — ABNORMAL HIGH (ref 0.61–1.24)
GFR calc Af Amer: 20 mL/min — ABNORMAL LOW (ref >60)
GFR calc non Af Amer: 17 mL/min — ABNORMAL LOW (ref >60)
Glucose, Bld: 218 mg/dL — ABNORMAL HIGH (ref 70–99)
Potassium: 3.9 mmol/L (ref 3.5–5.1)
Sodium: 140 mmol/L (ref 135–145)

## 2019-02-21 LAB — GLUCOSE, CAPILLARY
Glucose-Capillary: 101 mg/dL — ABNORMAL HIGH (ref 70–99)
Glucose-Capillary: 138 mg/dL — ABNORMAL HIGH (ref 70–99)

## 2019-02-21 LAB — T4, FREE: Free T4: 1.65 ng/dL — ABNORMAL HIGH (ref 0.61–1.12)

## 2019-02-21 MED ORDER — TORSEMIDE 20 MG PO TABS: 60.0000 mg | ORAL_TABLET | Freq: Two times a day (BID) | ORAL | 1 refills | Status: DC

## 2019-02-21 MED ORDER — SODIUM CHLORIDE 0.9% FLUSH: 3.0000 mL | INTRAVENOUS | Status: DC | PRN

## 2019-02-21 MED ORDER — ASPIRIN 81 MG PO TBEC: 81.0000 mg | DELAYED_RELEASE_TABLET | Freq: Every day | ORAL | 1 refills | Status: AC

## 2019-02-21 MED ORDER — ATORVASTATIN CALCIUM 40 MG PO TABS: 40.0000 mg | ORAL_TABLET | Freq: Every day | ORAL | 1 refills | Status: AC

## 2019-02-21 MED ORDER — ISOSORBIDE MONONITRATE ER 60 MG PO TB24: 60.0000 mg | ORAL_TABLET | Freq: Every day | ORAL | 1 refills | Status: DC

## 2019-02-21 MED ORDER — METOLAZONE 2.5 MG PO TABS: 2.5000 mg | ORAL_TABLET | ORAL | 1 refills | Status: DC

## 2019-02-21 MED ORDER — SODIUM CHLORIDE 0.9% FLUSH
3.0000 mL | Freq: Two times a day (BID) | INTRAVENOUS | Status: DC
  Administered 2019-02-21: 3 mL via INTRAVENOUS

## 2019-02-21 NOTE — Progress Notes (Addendum)
[facility]    NEPHROLOGY PROGRESS NOTE  SUBJECTIVE: Patient seen and examined.  Reports feeling better today.  Denies chest pain, shortness of breath, nausea, vomiting, diarrhea or dysuria.  Reports frequent urination.  Lower extremity edema is improving.  No orthopnea. All other review of systems are negative.  OBJECTIVE:  Vitals:   02/21/19 0032 02/21/19 0411  BP: (!) 123/50 120/63  Pulse: 67 69  Resp: 18 18  Temp: 98.3 F (36.8 C) 97.9 F (36.6 C)  SpO2: 97% 96%    Intake/Output Summary (Last 24 hours) at 02/21/2019 1024 Last data filed at 02/21/2019 0751 Gross per 24 hour  Intake 1340 ml  Output 3550 ml  Net -2210 ml      General:  AAOx3 NAD HEENT: MMM [place] AT anicteric sclera Neck:  No JVD, no adenopathy CV:  Heart RRR  Lungs:  L/S CTA bilaterally Abd:  abd SNT/ND with normal BS GU:  Bladder non-palpable Extremities:   +1 bilateral LE edema - much improved Skin:  No skin rash Psych:  normal mood and affect Neuro:  no focal deficits  MEDICATIONS:  . amiodarone  200 mg Oral Daily  . aspirin EC  81 mg Oral Daily  . atorvastatin  40 mg Oral q1800  . carvedilol  6.25 mg Oral BID WC  . furosemide  80 mg Intravenous TID  . gabapentin  600 mg Oral TID  . heparin  5,000 Units Subcutaneous Q8H  . hydrALAZINE  100 mg Oral TID  . insulin aspart  0-15 Units Subcutaneous TID WC  . insulin glargine  25 Units Subcutaneous QHS  . isosorbide mononitrate  60 mg Oral Daily  . metolazone  2.5 mg Oral Daily  . sodium chloride flush  3 mL Intravenous Q12H  . tamsulosin  0.4 mg Oral QPC supper       LABS:   CBC Latest Ref Rng & Units 02/19/2019 02/19/2019 09/28/2014  WBC 4.0 - 10.5 K/uL - 8.6 8.2  Hemoglobin 13.0 - 17.0 g/dL 8.5(L) 8.3(L) 12.2(L)  Hematocrit 39.0 - 52.0 % 25.0(L) 25.7(L) 34.1(L)  Platelets 150 - 400 K/uL - 132(L) 163    CMP Latest Ref Rng & Units 02/21/2019 02/20/2019 02/19/2019  Glucose 70 - 99 mg/dL 218(H) 133(H) 186(H)  BUN 6 - 20 mg/dL  47(H) 45(H) 42(H)  Creatinine 0.61 - 1.24 mg/dL 3.63(H) 3.38(H) 3.20(H)  Sodium 135 - 145 mmol/L 140 142 143  Potassium 3.5 - 5.1 mmol/L 3.9 4.1 4.6  Chloride 98 - 111 mmol/L 102 109 111  CO2 22 - 32 mmol/L 25 23 -  Calcium 8.9 - 10.3 mg/dL 9.3 9.2 -  Total Protein 6.5 - 8.1 g/dL - - -  Total Bilirubin 0.3 - 1.2 mg/dL - - -  Alkaline Phos 38 - 126 U/L - - -  AST 15 - 41 U/L - - -  ALT 0 - 44 U/L - - -    Lab Results  Component Value Date   CALCIUM 9.3 02/21/2019   CAION 1.22 02/19/2019       Component Value Date/Time   COLORURINE AMBER (A) 08/11/2011 1931   APPEARANCEUR CLOUDY (A) 08/11/2011 1931   LABSPEC 1.031 (H) 08/11/2011 1931   PHURINE 5.0 08/11/2011 1931   GLUCOSEU 250 (A) 08/11/2011 1931   HGBUR TRACE (A) 08/11/2011 1931   BILIRUBINUR SMALL (A) 08/11/2011 1931   KETONESUR NEGATIVE 08/11/2011 1931   PROTEINUR >300 (A) 08/11/2011 1931   UROBILINOGEN 1.0 08/11/2011 1931   NITRITE NEGATIVE 08/11/2011  [place] (A) 08/11/2011 1931      Component Value Date/Time   PHART 7.350 06/25/2011 0351   PCO2ART 52.8 (H) 06/25/2011 0351   PO2ART 94.0 06/25/2011 0351   HCO3 29.0 (H) 06/25/2011 0351   TCO2 25 02/19/2019 1152   ACIDBASEDEF 1.0 06/24/2011 1055   O2SAT 97.0 06/25/2011 0351    No results found for: IRON, TIBC, FERRITIN, IRONPCTSAT     ASSESSMENT/PLAN:    The patient is a 58 y.o. year old male patient with a past medical history significant for chronic combined congestive heart failure, paroxysmal atrial fibrillation, type 2 diabetes, lymphoma, and stage IV CKD who presents for increasing shortness of breath and lower extremity edema.   1.  Chronic kidney disease stage IV.  His baseline serum creatinine appears to be around 3.3 most recently.  He had around 1.2 g of proteinuria earlier this month, attributed to underlying diabetic nephropathy.  Renal ultrasound from January 2020 was negative for obstruction.  SPEP is pending for completeness.  Needs  to follow up as OP.  Creat bumped up slightly with diuresis but is overall stable.  [place] for dispo from renal standpoint.  2.  Anasarca.  We will recheck a urine protein to creatinine ratio, which is still pending.   Excellent diuresis.  Would recommend increasing OP torsemide to 60mg  BID and adding metolazone 2.5mg  twice weekly.  3.  History of chronic systolic congestive heart failure.  His EF has improved to 50 to 55% on the most recent reading with borderline diastolic dysfunction.     [place], DO, [place]

## 2019-02-21 NOTE — Progress Notes (Signed)
Progress Note  Patient Name: Curtis Mays Date of Encounter: 02/21/2019  Primary Cardiologist: No primary care provider on file.   Subjective   "Doc I wanna go home."  Inpatient Medications    Scheduled Meds: . amiodarone  200 mg Oral Daily  . aspirin EC  81 mg Oral Daily  . atorvastatin  40 mg Oral q1800  . carvedilol  6.25 mg Oral BID WC  . furosemide  80 mg Intravenous TID  . gabapentin  600 mg Oral TID  . heparin  5,000 Units Subcutaneous Q8H  . hydrALAZINE  100 mg Oral TID  . insulin aspart  0-15 Units Subcutaneous TID WC  . insulin glargine  25 Units Subcutaneous QHS  . isosorbide mononitrate  60 mg Oral Daily  . metolazone  2.5 mg Oral Daily  . sodium chloride flush  3 mL Intravenous Q12H  . tamsulosin  0.4 mg Oral QPC supper   Continuous Infusions:  PRN Meds: acetaminophen **OR** acetaminophen, senna-docusate, sodium chloride flush   Vital Signs    Vitals:   02/20/19 2102 02/21/19 0032 02/21/19 0033 02/21/19 0411  BP: 123/62 (!) 123/50  120/63  Pulse:  67  69  Resp:  18  18  Temp:  98.3 F (36.8 C)  97.9 F (36.6 C)  TempSrc:  Oral  Oral  SpO2:  97%  96%  Weight:   102.3 kg   Height:        Intake/Output Summary (Last 24 hours) at 02/21/2019 1100 Last data filed at 02/21/2019 0751 Gross per 24 hour  Intake 1290 ml  Output 3550 ml  Net -2260 ml   Filed Weights   02/19/19 1449 02/20/19 0511 02/21/19 0033  Weight: 107.4 kg 104.3 kg 102.3 kg    Telemetry    NSR - Personally Reviewed  ECG    none - Personally Reviewed  Physical Exam   GEN: No acute distress.   Neck: No JVD Cardiac: Regular RR Respiratory: no increased work of breathing GI: Soft, nontender, non-distended  MS: No edema; No deformity. Neuro:  Nonfocal  Psych: Normal affect   Labs    Chemistry Recent Labs  Lab 02/19/19 1119 02/19/19 1152 02/20/19 0449 02/21/19 0925  NA 141 143 142 140  K 4.6 4.6 4.1 3.9  CL 110 111 109 102  CO2 22  --  23 25  GLUCOSE 188*  186* 133* 218*  BUN 42* 42* 45* 47*  CREATININE 3.14* 3.20* 3.38* 3.63*  CALCIUM 9.2  --  9.2 9.3  PROT 7.1  --   --   --   ALBUMIN 3.9  --   --   --   AST 16  --   --   --   ALT 23  --   --   --   ALKPHOS 70  --   --   --   BILITOT 1.2  --   --   --   GFRNONAA 20*  --  19* 17*  GFRAA 24*  --  22* 20*  ANIONGAP 9  --  10 13     Hematology Recent Labs  Lab 02/19/19 1119 02/19/19 1152  WBC 8.6  --   RBC 2.60*  --   HGB 8.3* 8.5*  HCT 25.7* 25.0*  MCV 98.8  --   MCH 31.9  --   MCHC 32.3  --   RDW 13.6  --   PLT 132*  --     Cardiac Enzymes Recent Labs  Lab 02/19/19 1119 02/19/19 1903 02/20/19 0003  TROPONINI 0.07* 0.06* 0.05*   No results for input(s): TROPIPOC in the last 168 hours.   BNP Recent Labs  Lab 02/19/19 1119  BNP 307.9*     DDimer No results for input(s): DDIMER in the last 168 hours.   Radiology    Nm Myocar Multi W/spect W/wall Motion / Ef  Result Date: 02/20/2019 CLINICAL DATA:  58 year old with dyspnea. EXAM: MYOCARDIAL IMAGING WITH SPECT (REST AND PHARMACOLOGIC-STRESS) GATED LEFT VENTRICULAR WALL MOTION STUDY LEFT VENTRICULAR EJECTION FRACTION TECHNIQUE: Standard myocardial SPECT imaging was performed after resting intravenous injection of 10 mCi Tc-87m tetrofosmin. Subsequently, intravenous infusion of Lexiscan was performed under the supervision of the Cardiology staff. At peak effect of the drug, 30 mCi Tc-42m tetrofosmin was injected intravenously and standard myocardial SPECT imaging was performed. Quantitative gated imaging was also performed to evaluate left ventricular wall motion, and estimate left ventricular ejection fraction. COMPARISON:  None. FINDINGS: Perfusion: Markedly decreased uptake along the inferior wall on both the rest and stress images. Fixed defect along the inferior apex. Findings along the inferior wall could represent infarct although there is relatively normal wall motion in this area. Mild reversibility along the mid  anterior wall is nonspecific. Wall Motion: Wall motion is essentially normal. Mild hypokinesia at the apex. Left Ventricular Ejection Fraction: 52 % End diastolic volume 967 ml End systolic volume 86 ml IMPRESSION: 1. Fixed defect along the inferior wall may represent infarct although there is relatively normal wall motion in this area. 2. Minimal reversibility along the mid anterior wall. Findings are equivocal for mild ischemia along the anterior wall. No definite areas of reversibility ischemia. 3. Mild-to-moderate left ventricular dilatation with ejection fraction of 52%. Mild hypokinesia along the apex. 4. Non invasive risk stratification*: Low *2012 Appropriate Use Criteria for Coronary Revascularization Focused Update: J Am Coll Cardiol. 8938;10(1):751-025. http://content.airportbarriers.com.aspx?articleid=[email or web address] Electronically Signed   By: Markus Daft M.D.   On: 02/20/2019 12:59   Dg Chest Port 1 View  Result Date: 02/19/2019 CLINICAL DATA:  Shortness of breath. EXAM: PORTABLE CHEST 1 VIEW COMPARISON:  02/08/2019. FINDINGS: Stable enlarged cardiac silhouette. Decreased prominence of the pulmonary vasculature and interstitial markings. Small bilateral pleural effusions with improvement. Thoracic spine degenerative changes. Diffuse osteopenia. Cervical spine fixation hardware. IMPRESSION: Improving changes of congestive heart failure. Electronically Signed   By: Claudie Revering M.D.   On: 02/19/2019 12:09    Cardiac Studies   none  Patient Profile     58 y.o. male admitted with acute on chronic diastolic heart failure and chronic stage 4 renal insufficiency.  Assessment & Plan    1. Acute on chronic diastolic heart failure - he is probably back to his baseline. He can be discharged home on Torsemide 60 bid and metolazone 2.5 mg twice weekly as per nephrology. He has followup in 5 days with [facility] cardiologist. 2. Stage 4 renal disease - his creatinine is up minimally so. He can be  rechecked at time of his cardiology followup. 3. CAD - he has an inferior scar on his stress test. I would not have him undergo invasive evaluation due to his renal failure.  Disp. - ok for DC home from my perspective.  Followup as outlined above. [facility] will sign off.   Medication Recommendations:  See above Other recommendations (labs, testing, etc):  Bmp in 5 days in [facility] Follow up as an outpatient:  5 days with general cardiologist in [facility].  For questions  or updates, please contact Post Please consult [email or web address] for contact info under Cardiology/STEMI.  Signed, Cristopher Peru, MD  02/21/2019, 11:00 AM  Patient ID: [name], male   DOB: 1960/11/01, 58 y.o.   MRN: [id number]

## 2019-02-21 NOTE — Plan of Care (Signed)
  Problem: Education: Goal: Knowledge of General Education information will improve Description: Including pain rating scale, medication(s)/side effects and non-pharmacologic comfort measures Outcome: Completed/Met   Problem: Health Behavior/Discharge Planning: Goal: Ability to manage health-related needs will improve Outcome: Completed/Met   Problem: Clinical Measurements: Goal: Ability to maintain clinical measurements within normal limits will improve Outcome: Completed/Met Goal: Will remain free from infection Outcome: Completed/Met Goal: Diagnostic test results will improve Outcome: Completed/Met Goal: Respiratory complications will improve Outcome: Completed/Met Goal: Cardiovascular complication will be avoided Outcome: Completed/Met   Problem: Activity: Goal: Risk for activity intolerance will decrease Outcome: Completed/Met   Problem: Nutrition: Goal: Adequate nutrition will be maintained Outcome: Completed/Met   Problem: Coping: Goal: Level of anxiety will decrease Outcome: Completed/Met   Problem: Elimination: Goal: Will not experience complications related to bowel motility Outcome: Completed/Met Goal: Will not experience complications related to urinary retention Outcome: Completed/Met   Problem: Pain Managment: Goal: General experience of comfort will improve Outcome: Completed/Met   Problem: Safety: Goal: Ability to remain free from injury will improve Outcome: Completed/Met   Problem: Skin Integrity: Goal: Risk for impaired skin integrity will decrease Outcome: Completed/Met   Problem: Education: Goal: Ability to demonstrate management of disease process will improve Outcome: Completed/Met Goal: Ability to verbalize understanding of medication therapies will improve Outcome: Completed/Met Goal: Individualized Educational Video(s) Outcome: Completed/Met   Problem: Activity: Goal: Capacity to carry out activities will improve Outcome:  Completed/Met   Problem: Cardiac: Goal: Ability to achieve and maintain adequate cardiopulmonary perfusion will improve Outcome: Completed/Met   Discharge instructions discussed with patient.  These included the following:  prescriptions, follow-up appointments, new medication, when to call the MD,  overview of CHF booklet, prevention of future exacerbations of illness, dietary recommendations, activity recommendations, etc.  Comprehension of information presented was ascertained via "teach-beck" technique

## 2019-02-21 NOTE — Discharge Summary (Signed)
Name: Curtis Mays MRN: [id number] DOB: 1960-10-28 58 y.o. PCP: Helen Hashimoto., MD  Date of Admission: 02/19/2019 11:03 AM Date of Discharge: 02/21/2019 Attending Physician: Dr. Dareen Piano  Discharge Diagnosis: 1. CKD IV 2. Heart Failure With reduced Ejection Fraction 3. Paroxysmal atrial fibrillation 4. Diabetes Mellitus, type II  Discharge Medications: Allergies as of 02/21/2019   No Known Allergies     Medication List    TAKE these medications   amiodarone 200 MG tablet Commonly known as: PACERONE Take 200 mg by mouth daily. Notes to patient: Next dose due tomorrow morning   aspirin 81 MG EC tablet Take 1 tablet (81 mg total) by mouth daily. Notes to patient: Dose due tomorrow morning   atorvastatin 40 MG tablet Commonly known as: LIPITOR Take 1 tablet (40 mg total) by mouth daily at 6 PM. Notes to patient: Next dose due this evening   carvedilol 6.25 MG tablet Commonly known as: COREG Take 6.25 mg by mouth 2 (two) times daily with a meal. Notes to patient: Next dose due at supper   gabapentin 600 MG tablet Commonly known as: NEURONTIN Take 600 mg by mouth 3 (three) times daily. Notes to patient: Next dose due this afternoon   hydrALAZINE 100 MG tablet Commonly known as: APRESOLINE Take 100 mg by mouth 3 (three) times daily. Notes to patient: Next dose due this afternoon   insulin aspart 100 UNIT/ML injection Commonly known as: novoLOG Inject into the skin 3 (three) times daily before meals. 10units per 29 carbs/sliding scale   insulin glargine 100 UNIT/ML injection Commonly known as: LANTUS Inject 30-40 Units into the skin at bedtime. Notes to patient: Next dose due today at bedtime.   isosorbide mononitrate 60 MG 24 hr tablet Commonly known as: IMDUR Take 1 tablet (60 mg total) by mouth daily. What changed:   medication strength  how much to take Notes to patient: Next dose tomorrow morning   metolazone 2.5 MG tablet Commonly known as:  ZAROXOLYN Take 1 tablet (2.5 mg total) by mouth 2 (two) times a week. Notes to patient: Nexxt dose due tomorrow morning   oxyCODONE 5 MG immediate release tablet Commonly known as: Oxy IR/ROXICODONE Take 5 mg by mouth every 6 (six) hours as needed for severe pain (gout).   tamsulosin 0.4 MG Caps capsule Commonly known as: FLOMAX Take 0.4 mg by mouth daily after supper. Notes to patient: Next dose due this evening after supper   torsemide 20 MG tablet Commonly known as: DEMADEX Take 3 tablets (60 mg total) by mouth 2 (two) times daily. What changed: how much to take Notes to patient: Take next dose this evening       Disposition and follow-up:   Curtis Mays was discharged from [facility] in Stable condition.  At the hospital follow up visit please address:  1.  CKDIV: Progressively worsening renal failure with volume overload. Has discussed renal transplant with nephrology as an outpatient. They were on board this admit and adjusted his diuretics dose.      HFrEF: NM cardiac scan did not demonstrate actionable findings but did demonstrate an anterior scar.   2.  Labs / imaging needed at time of follow-up: BMP for renal function  3.  Pending labs/ test needing follow-up: n/a  Follow-up Appointments: Follow-up Information    Helen Hashimoto., MD On 03/01/2019.   Specialty: Internal Medicine Why: @10 :00am Contact information: [place] [address] [phone]  Hospital Course by problem list: Patient encounter summary: Curtis Mays is a 58 yo M w/ PMH of HFrEF (EF45-50%), paroxysmalA.fib/flutter s/p cardioversion, HTN, T2DM, hx of MALT lymphoma, whopresents to the ER for orthopnea, dyspnea and pedal edema that had progressively worsened since his discharge from [facility] on February 08, 2019.The patients exam was concerning for volume overload likely multifactorial secondary toprogressiveCKDIV and  HFrEF.Patient was admitted and underwent aggressive IV diuresis with marked symptomatic improvement.   CKD stage IV: Patient's weight at discharge appeared to be near his dry weight which was chalenged slightly given persistent BLE edema. Weight at discharge was 102.3kg. Patient has been discussing possibility of transplant versus dialysis with nephrology which he will continue to do at discharge. He was discharged on torsemide 60mg  BID and metolazone twice weekly.   HFrEF: Marginally reduced LVEF with mild diastolic heart failure per most recent echocardiogram.  Nuclear medicine cardiac testing during his admission revealed a similar ejection fraction of 52%, with mild hypokinesis along the apex, no definitive areas of reversibility, with a low risk stratification. Medication adjustments included being discharged on carvedilol 6.25 mg twice daily, hydralazine 100mg  3 times daily and Imdur 60 mg daily. He is to follow-up with cardiology.  Hx ofParoxysmal atrial fibrillation: Patient has remained in NSR this admission. Continue home medications.  Diabetes mellitus type 2: CBGs were well within desirable lilmites during admission. He was discharged on his home regimen.   Discharge Vitals:   BP (!) 117/54 (BP Location: Left Arm)   Pulse 71   Temp 98.2 F (36.8 C) (Oral)   Resp 17   Ht 5\' 11"  (1.803 m)   Wt 102.3 kg Comment: scale a  SpO2 98%   BMI 31.46 kg/m   Pertinent Labs, Studies, and Procedures:  CBC Latest Ref Rng & Units 02/19/2019 02/19/2019 09/28/2014  WBC 4.0 - 10.5 K/uL - 8.6 8.2  Hemoglobin 13.0 - 17.0 g/dL 8.5(L) 8.3(L) 12.2(L)  Hematocrit 39.0 - 52.0 % 25.0(L) 25.7(L) 34.1(L)  Platelets 150 - 400 K/uL - 132(L) 163   CMP Latest Ref Rng & Units 02/21/2019 02/20/2019 02/19/2019  Glucose 70 - 99 mg/dL 218(H) 133(H) 186(H)  BUN 6 - 20 mg/dL 47(H) 45(H) 42(H)  Creatinine 0.61 - 1.24 mg/dL 3.63(H) 3.38(H) 3.20(H)  Sodium 135 - 145 mmol/L 140 142 143  Potassium 3.5 - 5.1  mmol/L 3.9 4.1 4.6  Chloride 98 - 111 mmol/L 102 109 111  CO2 22 - 32 mmol/L 25 23 -  Calcium 8.9 - 10.3 mg/dL 9.3 9.2 -  Total Protein 6.5 - 8.1 g/dL - - -  Total Bilirubin 0.3 - 1.2 mg/dL - - -  Alkaline Phos 38 - 126 U/L - - -  AST 15 - 41 U/L - - -  ALT 0 - 44 U/L - - -   NM Myocar: IMPRESSION: 1. Fixed defect along the inferior wall may represent infarct although there is relatively normal wall motion in this area. 2. Minimal reversibility along the mid anterior wall. Findings are equivocal for mild ischemia along the anterior wall. No definite areas of reversibility ischemia. 3. Mild-to-moderate left ventricular dilatation with ejection fraction of 52%. Mild hypokinesia along the apex. 4. Non invasive risk stratification: Low  CXR: FINDINGS: Stable enlarged cardiac silhouette. Decreased prominence of the pulmonary vasculature and interstitial markings. Small bilateral pleural effusions with improvement. Thoracic spine degenerative changes. Diffuse osteopenia. Cervical spine fixation hardware. IMPRESSION: Improving changes of congestive heart failure.  Discharge Instructions: Discharge Instructions    (HEART  FAILURE PATIENTS) Call MD:  Anytime you have any of the following symptoms: 1) 3 pound weight gain in 24 hours or 5 pounds in 1 week 2) shortness of breath, with or without a dry hacking cough 3) swelling in the hands, feet or stomach 4) if you have to sleep on extra pillows at night in order to breathe.   Complete by: As directed    Diet - low sodium heart healthy   Complete by: As directed    Discharge instructions   Complete by: As directed    Please start taking the torsemide at 60mg  two times daily. Also take the metolazone two times per week evenly spread out as much a possible. Please consume a low sodium diet as well.   Increase activity slowly   Complete by: As directed       Signed: Kathi Ludwig, MD 02/24/2019, 8:00 AM   Pager: Pager#  [phone]

## 2019-02-21 NOTE — Progress Notes (Signed)
   Subjective: Patient was seen up in his bed today being to his wife on the telephone upon entering the room.  He denies acute concerns today.  States that his symptoms have resolved.  He is no longer experiencing orthopnea, dyspnea, and feels that he has more energy.  The patient states that his typical weight is 225 to 230 pounds on average.  Objective:  Vital signs in last 24 hours: Vitals:   02/20/19 2102 02/21/19 0032 02/21/19 0033 02/21/19 0411  BP: 123/62 (!) 123/50  120/63  Pulse:  67  69  Resp:  18  18  Temp:  98.3 F (36.8 C)  97.9 F (36.6 C)  TempSrc:  Oral  Oral  SpO2:  97%  96%  Weight:   102.3 kg   Height:       General: A/O x4, in no acute distress, afebrile, nondiaphoretic Cardio: RRR, no mrg's, +1 pitting edema bilaterally, minimal JVD Pulmonary: CTA bilaterally, no wheezing or crackles  Abdomen: Bowel sounds normal, soft, nontender  MSK: BLE nontender Psych: Appropriate affect, not depressed in appearance, engages well  Assessment/Plan:  Principal Problem:   CKD (chronic kidney disease), stage IV (HCC) Active Problems:   DM2 (diabetes mellitus, type 2) (HCC)   HTN (hypertension)   Acute CHF (congestive heart failure) (HCC)   Paroxysmal atrial fibrillation ([place])  Patient encounter summary: Curtis Mays is a 58 yo M w/ PMH of HFrEF (EF45-50%), paroxysmalA.fib/flutter s/p cardioversion, HTN, T2DM, hx of MALT lymphoma, whopresents to the ER for orthopnea, dyspnea and pedal edema that is progressively worsened since his discharge from [facility] on February 08, 2019.The patients exam is concerning for volume overload likely multifactorial but likely 2/2 toprogressiveCKDIV and HFrEF.  Patient was admitted and underwent aggressive IV diuresis.  A/P: CKD stage IV: Patient's weight appears to be near his dry weight, however, given the marked pitting edema in the bilateral lower extremities he may require additional diuresis to challenge the dry weight.   Creatinine undetermined today as the phlebotomy team is understaffed today.  Patient creatinine was 3.38 the prior day up from 3.20 on admission.  Likely secondary to high-dose IV diuretics. Patient has been discussing possibility of transplant versus dialysis with nephrology. The patient is down ~5L since admission.    -Recommend ambulation -Continue strict I's and O's -Continue daily weights -Appreciate nephrology recommendations -Trend BMPs daily -Continue Lasix 80 mg IV 3 times daily -Continue metolazone to 5 mg daily  HFrEF: Marginally reduced LVEF with mild diastolic heart failure.  Nuclear medicine cardiac testing prior day revealed similar ejection fraction of 52%, with mild hypokinesis along the apex, no Devineni areas of reversibility, with a low risk stratification.  This again represents only mild heart failure and most likely not the primary cause of his hypervolemia. - Continue carvedilol 6.25 mg twice daily - Continue hydralazine 100mg  3 times daily - Continue Imdur 60 mg daily  Hx of Paroxysmal atrial fibrillation: Patient has remained in NSR this admission.  Continue home medications.  Diabetes mellitus type 2: CBGs indicate stable glucose control with current insulin regimen -Continue Lantus 25 units QHS -Continue SSI moderate  Dispo: Anticipated discharge in approximately 0-1 day(s).   Kathi Ludwig, MD 02/21/2019, 7:33 AM Pager: Pager# [phone]

## 2019-02-22 LAB — PROTEIN ELECTROPHORESIS, SERUM
A/G Ratio: 1.3 (ref 0.7–1.7)
Albumin ELP: 3.5 g/dL (ref 2.9–4.4)
Alpha-1-Globulin: 0.2 g/dL (ref 0.0–0.4)
Alpha-2-Globulin: 0.8 g/dL (ref 0.4–1.0)
Beta Globulin: 0.8 g/dL (ref 0.7–1.3)
Gamma Globulin: 0.9 g/dL (ref 0.4–1.8)
Globulin, Total: 2.7 g/dL (ref 2.2–3.9)
Total Protein ELP: 6.2 g/dL (ref 6.0–8.5)

## 2019-02-22 LAB — ANA W/REFLEX IF POSITIVE: Anti Nuclear Antibody (ANA): NEGATIVE

## 2019-02-25 DIAGNOSIS — I129 Hypertensive chronic kidney disease with stage 1 through stage 4 chronic kidney disease, or unspecified chronic kidney disease: Secondary | ICD-10-CM | POA: Diagnosis not present

## 2019-02-25 DIAGNOSIS — I509 Heart failure, unspecified: Secondary | ICD-10-CM | POA: Diagnosis not present

## 2019-02-25 DIAGNOSIS — D631 Anemia in chronic kidney disease: Secondary | ICD-10-CM | POA: Diagnosis not present

## 2019-02-25 DIAGNOSIS — N184 Chronic kidney disease, stage 4 (severe): Secondary | ICD-10-CM | POA: Diagnosis not present

## 2019-02-25 DIAGNOSIS — E559 Vitamin D deficiency, unspecified: Secondary | ICD-10-CM | POA: Diagnosis not present

## 2019-02-26 DIAGNOSIS — I480 Paroxysmal atrial fibrillation: Secondary | ICD-10-CM | POA: Diagnosis not present

## 2019-02-26 DIAGNOSIS — I5022 Chronic systolic (congestive) heart failure: Secondary | ICD-10-CM | POA: Diagnosis not present

## 2019-02-26 DIAGNOSIS — N183 Chronic kidney disease, stage 3 (moderate): Secondary | ICD-10-CM | POA: Diagnosis not present

## 2019-02-26 DIAGNOSIS — I130 Hypertensive heart and chronic kidney disease with heart failure and stage 1 through stage 4 chronic kidney disease, or unspecified chronic kidney disease: Secondary | ICD-10-CM | POA: Diagnosis not present

## 2019-03-01 DIAGNOSIS — N184 Chronic kidney disease, stage 4 (severe): Secondary | ICD-10-CM | POA: Diagnosis not present

## 2019-03-01 DIAGNOSIS — J449 Chronic obstructive pulmonary disease, unspecified: Secondary | ICD-10-CM | POA: Diagnosis not present

## 2019-03-01 DIAGNOSIS — Z09 Encounter for follow-up examination after completed treatment for conditions other than malignant neoplasm: Secondary | ICD-10-CM | POA: Diagnosis not present

## 2019-03-01 DIAGNOSIS — E1140 Type 2 diabetes mellitus with diabetic neuropathy, unspecified: Secondary | ICD-10-CM | POA: Diagnosis not present

## 2019-03-01 DIAGNOSIS — I509 Heart failure, unspecified: Secondary | ICD-10-CM | POA: Diagnosis not present

## 2019-03-15 DIAGNOSIS — N184 Chronic kidney disease, stage 4 (severe): Secondary | ICD-10-CM | POA: Diagnosis not present

## 2019-03-26 DIAGNOSIS — I509 Heart failure, unspecified: Secondary | ICD-10-CM | POA: Diagnosis not present

## 2019-03-26 DIAGNOSIS — N184 Chronic kidney disease, stage 4 (severe): Secondary | ICD-10-CM | POA: Diagnosis not present

## 2019-03-26 DIAGNOSIS — N2581 Secondary hyperparathyroidism of renal origin: Secondary | ICD-10-CM | POA: Diagnosis not present

## 2019-03-26 DIAGNOSIS — D631 Anemia in chronic kidney disease: Secondary | ICD-10-CM | POA: Diagnosis not present

## 2019-03-26 DIAGNOSIS — I129 Hypertensive chronic kidney disease with stage 1 through stage 4 chronic kidney disease, or unspecified chronic kidney disease: Secondary | ICD-10-CM | POA: Diagnosis not present

## 2019-03-26 DIAGNOSIS — N189 Chronic kidney disease, unspecified: Secondary | ICD-10-CM | POA: Diagnosis not present

## 2019-04-22 ENCOUNTER — Encounter (HOSPITAL_COMMUNITY): Payer: Medicare Other

## 2019-04-23 DIAGNOSIS — N183 Chronic kidney disease, stage 3 (moderate): Secondary | ICD-10-CM | POA: Diagnosis not present

## 2019-04-23 DIAGNOSIS — D631 Anemia in chronic kidney disease: Secondary | ICD-10-CM | POA: Diagnosis not present

## 2019-04-26 DIAGNOSIS — N184 Chronic kidney disease, stage 4 (severe): Secondary | ICD-10-CM | POA: Diagnosis not present

## 2019-04-27 ENCOUNTER — Other Ambulatory Visit: Payer: Self-pay | Admitting: Internal Medicine

## 2019-04-29 DIAGNOSIS — D631 Anemia in chronic kidney disease: Secondary | ICD-10-CM | POA: Diagnosis not present

## 2019-04-29 DIAGNOSIS — N189 Chronic kidney disease, unspecified: Secondary | ICD-10-CM | POA: Diagnosis not present

## 2019-04-30 ENCOUNTER — Other Ambulatory Visit: Payer: Self-pay | Admitting: Internal Medicine

## 2019-04-30 DIAGNOSIS — D631 Anemia in chronic kidney disease: Secondary | ICD-10-CM | POA: Diagnosis not present

## 2019-04-30 DIAGNOSIS — I509 Heart failure, unspecified: Secondary | ICD-10-CM | POA: Diagnosis not present

## 2019-04-30 DIAGNOSIS — N2581 Secondary hyperparathyroidism of renal origin: Secondary | ICD-10-CM | POA: Diagnosis not present

## 2019-04-30 DIAGNOSIS — I120 Hypertensive chronic kidney disease with stage 5 chronic kidney disease or end stage renal disease: Secondary | ICD-10-CM | POA: Diagnosis not present

## 2019-04-30 DIAGNOSIS — N185 Chronic kidney disease, stage 5: Secondary | ICD-10-CM | POA: Diagnosis not present

## 2019-05-06 DIAGNOSIS — N189 Chronic kidney disease, unspecified: Secondary | ICD-10-CM | POA: Diagnosis not present

## 2019-05-06 DIAGNOSIS — D631 Anemia in chronic kidney disease: Secondary | ICD-10-CM | POA: Diagnosis not present

## 2019-05-07 DIAGNOSIS — D631 Anemia in chronic kidney disease: Secondary | ICD-10-CM | POA: Diagnosis not present

## 2019-05-07 DIAGNOSIS — N183 Chronic kidney disease, stage 3 (moderate): Secondary | ICD-10-CM | POA: Diagnosis not present

## 2019-05-24 DIAGNOSIS — S99922A Unspecified injury of left foot, initial encounter: Secondary | ICD-10-CM | POA: Diagnosis not present

## 2019-05-24 DIAGNOSIS — M25572 Pain in left ankle and joints of left foot: Secondary | ICD-10-CM | POA: Diagnosis not present

## 2019-05-24 DIAGNOSIS — S99912A Unspecified injury of left ankle, initial encounter: Secondary | ICD-10-CM | POA: Diagnosis not present

## 2019-05-24 DIAGNOSIS — M79672 Pain in left foot: Secondary | ICD-10-CM | POA: Diagnosis not present

## 2019-05-24 DIAGNOSIS — M7989 Other specified soft tissue disorders: Secondary | ICD-10-CM | POA: Diagnosis not present

## 2019-05-27 DIAGNOSIS — S93402A Sprain of unspecified ligament of left ankle, initial encounter: Secondary | ICD-10-CM | POA: Diagnosis not present

## 2019-05-27 DIAGNOSIS — N184 Chronic kidney disease, stage 4 (severe): Secondary | ICD-10-CM | POA: Diagnosis not present

## 2019-05-28 DIAGNOSIS — M159 Polyosteoarthritis, unspecified: Secondary | ICD-10-CM | POA: Diagnosis not present

## 2019-05-28 DIAGNOSIS — M25562 Pain in left knee: Secondary | ICD-10-CM | POA: Diagnosis not present

## 2019-06-02 DIAGNOSIS — I10 Essential (primary) hypertension: Secondary | ICD-10-CM | POA: Diagnosis not present

## 2019-06-02 DIAGNOSIS — N184 Chronic kidney disease, stage 4 (severe): Secondary | ICD-10-CM | POA: Diagnosis not present

## 2019-06-02 DIAGNOSIS — E1165 Type 2 diabetes mellitus with hyperglycemia: Secondary | ICD-10-CM | POA: Diagnosis not present

## 2019-06-02 DIAGNOSIS — E1140 Type 2 diabetes mellitus with diabetic neuropathy, unspecified: Secondary | ICD-10-CM | POA: Diagnosis not present

## 2019-06-02 DIAGNOSIS — J449 Chronic obstructive pulmonary disease, unspecified: Secondary | ICD-10-CM | POA: Diagnosis not present

## 2019-06-02 DIAGNOSIS — I509 Heart failure, unspecified: Secondary | ICD-10-CM | POA: Diagnosis not present

## 2019-06-08 DIAGNOSIS — R05 Cough: Secondary | ICD-10-CM | POA: Diagnosis not present

## 2019-06-14 DIAGNOSIS — E1140 Type 2 diabetes mellitus with diabetic neuropathy, unspecified: Secondary | ICD-10-CM | POA: Diagnosis not present

## 2019-07-12 DIAGNOSIS — M109 Gout, unspecified: Secondary | ICD-10-CM | POA: Diagnosis not present

## 2019-07-12 DIAGNOSIS — R600 Localized edema: Secondary | ICD-10-CM | POA: Diagnosis not present

## 2019-07-12 DIAGNOSIS — N189 Chronic kidney disease, unspecified: Secondary | ICD-10-CM | POA: Diagnosis not present

## 2019-07-12 DIAGNOSIS — I120 Hypertensive chronic kidney disease with stage 5 chronic kidney disease or end stage renal disease: Secondary | ICD-10-CM | POA: Diagnosis not present

## 2019-07-12 DIAGNOSIS — N185 Chronic kidney disease, stage 5: Secondary | ICD-10-CM | POA: Diagnosis not present

## 2019-07-12 DIAGNOSIS — D631 Anemia in chronic kidney disease: Secondary | ICD-10-CM | POA: Diagnosis not present

## 2019-07-12 DIAGNOSIS — N2581 Secondary hyperparathyroidism of renal origin: Secondary | ICD-10-CM | POA: Diagnosis not present

## 2019-07-27 DIAGNOSIS — N183 Chronic kidney disease, stage 3 unspecified: Secondary | ICD-10-CM | POA: Diagnosis not present

## 2019-07-27 DIAGNOSIS — D631 Anemia in chronic kidney disease: Secondary | ICD-10-CM | POA: Diagnosis not present

## 2019-08-10 DIAGNOSIS — N183 Chronic kidney disease, stage 3 unspecified: Secondary | ICD-10-CM | POA: Diagnosis not present

## 2019-08-10 DIAGNOSIS — D631 Anemia in chronic kidney disease: Secondary | ICD-10-CM | POA: Diagnosis not present

## 2019-08-11 DIAGNOSIS — D509 Iron deficiency anemia, unspecified: Secondary | ICD-10-CM | POA: Diagnosis not present

## 2019-08-11 DIAGNOSIS — D631 Anemia in chronic kidney disease: Secondary | ICD-10-CM | POA: Diagnosis not present

## 2019-08-11 DIAGNOSIS — N189 Chronic kidney disease, unspecified: Secondary | ICD-10-CM | POA: Diagnosis not present

## 2019-08-13 DIAGNOSIS — N2581 Secondary hyperparathyroidism of renal origin: Secondary | ICD-10-CM | POA: Diagnosis not present

## 2019-08-13 DIAGNOSIS — N184 Chronic kidney disease, stage 4 (severe): Secondary | ICD-10-CM | POA: Diagnosis not present

## 2019-08-13 DIAGNOSIS — I129 Hypertensive chronic kidney disease with stage 1 through stage 4 chronic kidney disease, or unspecified chronic kidney disease: Secondary | ICD-10-CM | POA: Diagnosis not present

## 2019-08-13 DIAGNOSIS — R600 Localized edema: Secondary | ICD-10-CM | POA: Diagnosis not present

## 2019-08-13 DIAGNOSIS — M109 Gout, unspecified: Secondary | ICD-10-CM | POA: Diagnosis not present

## 2019-08-20 DIAGNOSIS — D631 Anemia in chronic kidney disease: Secondary | ICD-10-CM | POA: Diagnosis not present

## 2019-08-20 DIAGNOSIS — D509 Iron deficiency anemia, unspecified: Secondary | ICD-10-CM | POA: Diagnosis not present

## 2019-08-20 DIAGNOSIS — N189 Chronic kidney disease, unspecified: Secondary | ICD-10-CM | POA: Diagnosis not present

## 2019-08-25 DIAGNOSIS — D631 Anemia in chronic kidney disease: Secondary | ICD-10-CM | POA: Diagnosis not present

## 2019-08-25 DIAGNOSIS — N183 Chronic kidney disease, stage 3 unspecified: Secondary | ICD-10-CM | POA: Diagnosis not present

## 2019-09-01 DIAGNOSIS — N184 Chronic kidney disease, stage 4 (severe): Secondary | ICD-10-CM | POA: Diagnosis not present

## 2019-09-01 DIAGNOSIS — J449 Chronic obstructive pulmonary disease, unspecified: Secondary | ICD-10-CM | POA: Diagnosis not present

## 2019-09-01 DIAGNOSIS — E1140 Type 2 diabetes mellitus with diabetic neuropathy, unspecified: Secondary | ICD-10-CM | POA: Diagnosis not present

## 2019-09-01 DIAGNOSIS — I4891 Unspecified atrial fibrillation: Secondary | ICD-10-CM | POA: Diagnosis not present

## 2019-09-01 DIAGNOSIS — I509 Heart failure, unspecified: Secondary | ICD-10-CM | POA: Diagnosis not present

## 2019-09-17 DIAGNOSIS — N183 Chronic kidney disease, stage 3 unspecified: Secondary | ICD-10-CM | POA: Diagnosis not present

## 2019-09-23 DIAGNOSIS — R600 Localized edema: Secondary | ICD-10-CM | POA: Diagnosis not present

## 2019-09-23 DIAGNOSIS — N185 Chronic kidney disease, stage 5: Secondary | ICD-10-CM | POA: Diagnosis not present

## 2019-09-23 DIAGNOSIS — N2581 Secondary hyperparathyroidism of renal origin: Secondary | ICD-10-CM | POA: Diagnosis not present

## 2019-09-23 DIAGNOSIS — M109 Gout, unspecified: Secondary | ICD-10-CM | POA: Diagnosis not present

## 2019-09-23 DIAGNOSIS — I120 Hypertensive chronic kidney disease with stage 5 chronic kidney disease or end stage renal disease: Secondary | ICD-10-CM | POA: Diagnosis not present

## 2019-10-22 DIAGNOSIS — H25812 Combined forms of age-related cataract, left eye: Secondary | ICD-10-CM | POA: Diagnosis not present

## 2019-10-22 DIAGNOSIS — E113512 Type 2 diabetes mellitus with proliferative diabetic retinopathy with macular edema, left eye: Secondary | ICD-10-CM | POA: Diagnosis not present

## 2019-10-22 DIAGNOSIS — H25813 Combined forms of age-related cataract, bilateral: Secondary | ICD-10-CM | POA: Diagnosis not present

## 2019-10-22 DIAGNOSIS — Z01818 Encounter for other preprocedural examination: Secondary | ICD-10-CM | POA: Diagnosis not present

## 2019-10-22 DIAGNOSIS — E113391 Type 2 diabetes mellitus with moderate nonproliferative diabetic retinopathy without macular edema, right eye: Secondary | ICD-10-CM | POA: Diagnosis not present

## 2019-10-29 DIAGNOSIS — N183 Chronic kidney disease, stage 3 unspecified: Secondary | ICD-10-CM | POA: Diagnosis not present

## 2019-10-29 DIAGNOSIS — I5022 Chronic systolic (congestive) heart failure: Secondary | ICD-10-CM | POA: Diagnosis not present

## 2019-10-29 DIAGNOSIS — E1122 Type 2 diabetes mellitus with diabetic chronic kidney disease: Secondary | ICD-10-CM | POA: Diagnosis not present

## 2019-10-29 DIAGNOSIS — N186 End stage renal disease: Secondary | ICD-10-CM | POA: Diagnosis not present

## 2019-11-01 DIAGNOSIS — E113512 Type 2 diabetes mellitus with proliferative diabetic retinopathy with macular edema, left eye: Secondary | ICD-10-CM | POA: Diagnosis not present

## 2019-11-01 DIAGNOSIS — E113513 Type 2 diabetes mellitus with proliferative diabetic retinopathy with macular edema, bilateral: Secondary | ICD-10-CM | POA: Diagnosis not present

## 2019-11-08 DIAGNOSIS — N2581 Secondary hyperparathyroidism of renal origin: Secondary | ICD-10-CM | POA: Diagnosis not present

## 2019-11-08 DIAGNOSIS — R600 Localized edema: Secondary | ICD-10-CM | POA: Diagnosis not present

## 2019-11-08 DIAGNOSIS — N185 Chronic kidney disease, stage 5: Secondary | ICD-10-CM | POA: Diagnosis not present

## 2019-11-08 DIAGNOSIS — N189 Chronic kidney disease, unspecified: Secondary | ICD-10-CM | POA: Diagnosis not present

## 2019-11-08 DIAGNOSIS — I120 Hypertensive chronic kidney disease with stage 5 chronic kidney disease or end stage renal disease: Secondary | ICD-10-CM | POA: Diagnosis not present

## 2019-11-08 DIAGNOSIS — M109 Gout, unspecified: Secondary | ICD-10-CM | POA: Diagnosis not present

## 2019-11-12 DIAGNOSIS — H25812 Combined forms of age-related cataract, left eye: Secondary | ICD-10-CM | POA: Diagnosis not present

## 2019-11-12 DIAGNOSIS — H2512 Age-related nuclear cataract, left eye: Secondary | ICD-10-CM | POA: Diagnosis not present

## 2019-11-16 DIAGNOSIS — R7989 Other specified abnormal findings of blood chemistry: Secondary | ICD-10-CM | POA: Insufficient documentation

## 2019-11-16 DIAGNOSIS — R079 Chest pain, unspecified: Secondary | ICD-10-CM | POA: Diagnosis not present

## 2019-11-16 DIAGNOSIS — I872 Venous insufficiency (chronic) (peripheral): Secondary | ICD-10-CM | POA: Diagnosis not present

## 2019-11-16 DIAGNOSIS — I081 Rheumatic disorders of both mitral and tricuspid valves: Secondary | ICD-10-CM | POA: Diagnosis not present

## 2019-11-16 DIAGNOSIS — I4510 Unspecified right bundle-branch block: Secondary | ICD-10-CM | POA: Diagnosis not present

## 2019-11-16 DIAGNOSIS — I214 Non-ST elevation (NSTEMI) myocardial infarction: Secondary | ICD-10-CM | POA: Diagnosis not present

## 2019-11-16 DIAGNOSIS — I4519 Other right bundle-branch block: Secondary | ICD-10-CM | POA: Diagnosis not present

## 2019-11-16 DIAGNOSIS — I120 Hypertensive chronic kidney disease with stage 5 chronic kidney disease or end stage renal disease: Secondary | ICD-10-CM | POA: Diagnosis not present

## 2019-11-16 DIAGNOSIS — E1122 Type 2 diabetes mellitus with diabetic chronic kidney disease: Secondary | ICD-10-CM | POA: Diagnosis not present

## 2019-11-16 DIAGNOSIS — D539 Nutritional anemia, unspecified: Secondary | ICD-10-CM | POA: Insufficient documentation

## 2019-11-16 DIAGNOSIS — Z794 Long term (current) use of insulin: Secondary | ICD-10-CM | POA: Diagnosis not present

## 2019-11-16 DIAGNOSIS — E7849 Other hyperlipidemia: Secondary | ICD-10-CM | POA: Diagnosis not present

## 2019-11-16 DIAGNOSIS — I480 Paroxysmal atrial fibrillation: Secondary | ICD-10-CM | POA: Diagnosis not present

## 2019-11-16 DIAGNOSIS — E7800 Pure hypercholesterolemia, unspecified: Secondary | ICD-10-CM | POA: Diagnosis not present

## 2019-11-16 DIAGNOSIS — H3561 Retinal hemorrhage, right eye: Secondary | ICD-10-CM | POA: Diagnosis not present

## 2019-11-16 DIAGNOSIS — R778 Other specified abnormalities of plasma proteins: Secondary | ICD-10-CM | POA: Insufficient documentation

## 2019-11-16 DIAGNOSIS — R799 Abnormal finding of blood chemistry, unspecified: Secondary | ICD-10-CM | POA: Insufficient documentation

## 2019-11-16 DIAGNOSIS — E785 Hyperlipidemia, unspecified: Secondary | ICD-10-CM | POA: Diagnosis not present

## 2019-11-16 DIAGNOSIS — Z8572 Personal history of non-Hodgkin lymphomas: Secondary | ICD-10-CM | POA: Diagnosis not present

## 2019-11-16 DIAGNOSIS — I440 Atrioventricular block, first degree: Secondary | ICD-10-CM | POA: Diagnosis not present

## 2019-11-16 DIAGNOSIS — N185 Chronic kidney disease, stage 5: Secondary | ICD-10-CM | POA: Diagnosis not present

## 2019-11-16 DIAGNOSIS — E1165 Type 2 diabetes mellitus with hyperglycemia: Secondary | ICD-10-CM | POA: Diagnosis not present

## 2019-11-16 DIAGNOSIS — M7989 Other specified soft tissue disorders: Secondary | ICD-10-CM | POA: Diagnosis not present

## 2019-11-16 DIAGNOSIS — I132 Hypertensive heart and chronic kidney disease with heart failure and with stage 5 chronic kidney disease, or end stage renal disease: Secondary | ICD-10-CM | POA: Diagnosis not present

## 2019-11-16 DIAGNOSIS — I5033 Acute on chronic diastolic (congestive) heart failure: Secondary | ICD-10-CM | POA: Diagnosis not present

## 2019-11-16 DIAGNOSIS — I2510 Atherosclerotic heart disease of native coronary artery without angina pectoris: Secondary | ICD-10-CM | POA: Diagnosis not present

## 2019-11-16 DIAGNOSIS — R0789 Other chest pain: Secondary | ICD-10-CM | POA: Diagnosis not present

## 2019-11-16 DIAGNOSIS — I252 Old myocardial infarction: Secondary | ICD-10-CM | POA: Diagnosis not present

## 2019-11-16 DIAGNOSIS — R072 Precordial pain: Secondary | ICD-10-CM | POA: Diagnosis not present

## 2019-11-16 DIAGNOSIS — K7460 Unspecified cirrhosis of liver: Secondary | ICD-10-CM | POA: Diagnosis not present

## 2019-11-16 DIAGNOSIS — Z7982 Long term (current) use of aspirin: Secondary | ICD-10-CM | POA: Diagnosis not present

## 2019-11-23 DIAGNOSIS — Z79899 Other long term (current) drug therapy: Secondary | ICD-10-CM | POA: Diagnosis not present

## 2019-11-29 DIAGNOSIS — E113511 Type 2 diabetes mellitus with proliferative diabetic retinopathy with macular edema, right eye: Secondary | ICD-10-CM | POA: Diagnosis not present

## 2019-12-10 DIAGNOSIS — D631 Anemia in chronic kidney disease: Secondary | ICD-10-CM | POA: Diagnosis not present

## 2019-12-10 DIAGNOSIS — N183 Chronic kidney disease, stage 3 unspecified: Secondary | ICD-10-CM | POA: Diagnosis not present

## 2019-12-17 DIAGNOSIS — R7989 Other specified abnormal findings of blood chemistry: Secondary | ICD-10-CM | POA: Diagnosis not present

## 2019-12-17 DIAGNOSIS — I5022 Chronic systolic (congestive) heart failure: Secondary | ICD-10-CM | POA: Diagnosis not present

## 2019-12-17 DIAGNOSIS — N185 Chronic kidney disease, stage 5: Secondary | ICD-10-CM | POA: Diagnosis not present

## 2019-12-17 DIAGNOSIS — R778 Other specified abnormalities of plasma proteins: Secondary | ICD-10-CM | POA: Diagnosis not present

## 2019-12-17 DIAGNOSIS — I5023 Acute on chronic systolic (congestive) heart failure: Secondary | ICD-10-CM | POA: Diagnosis not present

## 2019-12-22 DIAGNOSIS — E113511 Type 2 diabetes mellitus with proliferative diabetic retinopathy with macular edema, right eye: Secondary | ICD-10-CM | POA: Diagnosis not present

## 2019-12-22 DIAGNOSIS — Z01818 Encounter for other preprocedural examination: Secondary | ICD-10-CM | POA: Diagnosis not present

## 2019-12-22 DIAGNOSIS — H4311 Vitreous hemorrhage, right eye: Secondary | ICD-10-CM | POA: Diagnosis not present

## 2019-12-22 DIAGNOSIS — H25811 Combined forms of age-related cataract, right eye: Secondary | ICD-10-CM | POA: Diagnosis not present

## 2019-12-22 DIAGNOSIS — E113512 Type 2 diabetes mellitus with proliferative diabetic retinopathy with macular edema, left eye: Secondary | ICD-10-CM | POA: Diagnosis not present

## 2019-12-24 DIAGNOSIS — D631 Anemia in chronic kidney disease: Secondary | ICD-10-CM | POA: Diagnosis not present

## 2019-12-24 DIAGNOSIS — N183 Chronic kidney disease, stage 3 unspecified: Secondary | ICD-10-CM | POA: Diagnosis not present

## 2020-01-04 DIAGNOSIS — I25118 Atherosclerotic heart disease of native coronary artery with other forms of angina pectoris: Secondary | ICD-10-CM | POA: Insufficient documentation

## 2020-01-04 DIAGNOSIS — I110 Hypertensive heart disease with heart failure: Secondary | ICD-10-CM | POA: Diagnosis not present

## 2020-01-04 DIAGNOSIS — E7800 Pure hypercholesterolemia, unspecified: Secondary | ICD-10-CM | POA: Diagnosis not present

## 2020-01-04 DIAGNOSIS — I5022 Chronic systolic (congestive) heart failure: Secondary | ICD-10-CM | POA: Diagnosis not present

## 2020-01-04 DIAGNOSIS — I440 Atrioventricular block, first degree: Secondary | ICD-10-CM | POA: Diagnosis not present

## 2020-01-04 DIAGNOSIS — I2510 Atherosclerotic heart disease of native coronary artery without angina pectoris: Secondary | ICD-10-CM | POA: Diagnosis not present

## 2020-01-04 DIAGNOSIS — I4510 Unspecified right bundle-branch block: Secondary | ICD-10-CM | POA: Diagnosis not present

## 2020-01-04 DIAGNOSIS — I480 Paroxysmal atrial fibrillation: Secondary | ICD-10-CM | POA: Diagnosis not present

## 2020-01-07 DIAGNOSIS — D631 Anemia in chronic kidney disease: Secondary | ICD-10-CM | POA: Diagnosis not present

## 2020-01-07 DIAGNOSIS — H2511 Age-related nuclear cataract, right eye: Secondary | ICD-10-CM | POA: Diagnosis not present

## 2020-01-07 DIAGNOSIS — N184 Chronic kidney disease, stage 4 (severe): Secondary | ICD-10-CM | POA: Diagnosis not present

## 2020-01-07 DIAGNOSIS — H25811 Combined forms of age-related cataract, right eye: Secondary | ICD-10-CM | POA: Diagnosis not present

## 2020-01-07 DIAGNOSIS — N183 Chronic kidney disease, stage 3 unspecified: Secondary | ICD-10-CM | POA: Diagnosis not present

## 2020-01-12 DIAGNOSIS — R600 Localized edema: Secondary | ICD-10-CM | POA: Diagnosis not present

## 2020-01-12 DIAGNOSIS — M109 Gout, unspecified: Secondary | ICD-10-CM | POA: Diagnosis not present

## 2020-01-12 DIAGNOSIS — N184 Chronic kidney disease, stage 4 (severe): Secondary | ICD-10-CM | POA: Diagnosis not present

## 2020-01-12 DIAGNOSIS — I129 Hypertensive chronic kidney disease with stage 1 through stage 4 chronic kidney disease, or unspecified chronic kidney disease: Secondary | ICD-10-CM | POA: Diagnosis not present

## 2020-01-12 DIAGNOSIS — N2581 Secondary hyperparathyroidism of renal origin: Secondary | ICD-10-CM | POA: Diagnosis not present

## 2020-01-21 DIAGNOSIS — H4311 Vitreous hemorrhage, right eye: Secondary | ICD-10-CM | POA: Diagnosis not present

## 2020-01-25 DIAGNOSIS — H4311 Vitreous hemorrhage, right eye: Secondary | ICD-10-CM | POA: Diagnosis not present

## 2020-01-26 DIAGNOSIS — D631 Anemia in chronic kidney disease: Secondary | ICD-10-CM | POA: Diagnosis not present

## 2020-01-26 DIAGNOSIS — N183 Chronic kidney disease, stage 3 unspecified: Secondary | ICD-10-CM | POA: Diagnosis not present

## 2020-02-04 DIAGNOSIS — I120 Hypertensive chronic kidney disease with stage 5 chronic kidney disease or end stage renal disease: Secondary | ICD-10-CM | POA: Diagnosis not present

## 2020-02-04 DIAGNOSIS — I252 Old myocardial infarction: Secondary | ICD-10-CM | POA: Diagnosis not present

## 2020-02-04 DIAGNOSIS — I2510 Atherosclerotic heart disease of native coronary artery without angina pectoris: Secondary | ICD-10-CM | POA: Diagnosis not present

## 2020-02-04 DIAGNOSIS — Z01812 Encounter for preprocedural laboratory examination: Secondary | ICD-10-CM | POA: Diagnosis not present

## 2020-02-04 DIAGNOSIS — E1122 Type 2 diabetes mellitus with diabetic chronic kidney disease: Secondary | ICD-10-CM | POA: Diagnosis not present

## 2020-02-04 DIAGNOSIS — N186 End stage renal disease: Secondary | ICD-10-CM | POA: Diagnosis not present

## 2020-02-07 DIAGNOSIS — N186 End stage renal disease: Secondary | ICD-10-CM | POA: Insufficient documentation

## 2020-02-10 DIAGNOSIS — Z794 Long term (current) use of insulin: Secondary | ICD-10-CM | POA: Diagnosis not present

## 2020-02-10 DIAGNOSIS — N186 End stage renal disease: Secondary | ICD-10-CM | POA: Diagnosis not present

## 2020-02-10 DIAGNOSIS — K660 Peritoneal adhesions (postprocedural) (postinfection): Secondary | ICD-10-CM | POA: Diagnosis not present

## 2020-02-10 DIAGNOSIS — I132 Hypertensive heart and chronic kidney disease with heart failure and with stage 5 chronic kidney disease, or end stage renal disease: Secondary | ICD-10-CM | POA: Diagnosis not present

## 2020-02-10 DIAGNOSIS — Z87891 Personal history of nicotine dependence: Secondary | ICD-10-CM | POA: Diagnosis not present

## 2020-02-10 DIAGNOSIS — I2510 Atherosclerotic heart disease of native coronary artery without angina pectoris: Secondary | ICD-10-CM | POA: Diagnosis not present

## 2020-02-10 DIAGNOSIS — I252 Old myocardial infarction: Secondary | ICD-10-CM | POA: Diagnosis not present

## 2020-02-10 DIAGNOSIS — E1122 Type 2 diabetes mellitus with diabetic chronic kidney disease: Secondary | ICD-10-CM | POA: Diagnosis not present

## 2020-02-18 DIAGNOSIS — N184 Chronic kidney disease, stage 4 (severe): Secondary | ICD-10-CM | POA: Diagnosis not present

## 2020-02-22 DIAGNOSIS — G629 Polyneuropathy, unspecified: Secondary | ICD-10-CM | POA: Insufficient documentation

## 2020-02-22 DIAGNOSIS — S129XXA Fracture of neck, unspecified, initial encounter: Secondary | ICD-10-CM | POA: Insufficient documentation

## 2020-02-22 DIAGNOSIS — J449 Chronic obstructive pulmonary disease, unspecified: Secondary | ICD-10-CM | POA: Insufficient documentation

## 2020-02-22 DIAGNOSIS — Z8572 Personal history of non-Hodgkin lymphomas: Secondary | ICD-10-CM | POA: Insufficient documentation

## 2020-02-22 DIAGNOSIS — E559 Vitamin D deficiency, unspecified: Secondary | ICD-10-CM | POA: Insufficient documentation

## 2020-02-22 DIAGNOSIS — R809 Proteinuria, unspecified: Secondary | ICD-10-CM | POA: Insufficient documentation

## 2020-02-22 DIAGNOSIS — R600 Localized edema: Secondary | ICD-10-CM | POA: Insufficient documentation

## 2020-02-22 DIAGNOSIS — Z9889 Other specified postprocedural states: Secondary | ICD-10-CM | POA: Insufficient documentation

## 2020-02-22 DIAGNOSIS — D386 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, unspecified: Secondary | ICD-10-CM | POA: Insufficient documentation

## 2020-02-22 DIAGNOSIS — N400 Enlarged prostate without lower urinary tract symptoms: Secondary | ICD-10-CM | POA: Insufficient documentation

## 2020-02-22 DIAGNOSIS — C8590 Non-Hodgkin lymphoma, unspecified, unspecified site: Secondary | ICD-10-CM | POA: Insufficient documentation

## 2020-02-22 DIAGNOSIS — D631 Anemia in chronic kidney disease: Secondary | ICD-10-CM | POA: Insufficient documentation

## 2020-02-22 DIAGNOSIS — M109 Gout, unspecified: Secondary | ICD-10-CM | POA: Insufficient documentation

## 2020-02-22 DIAGNOSIS — I509 Heart failure, unspecified: Secondary | ICD-10-CM | POA: Insufficient documentation

## 2020-02-23 DIAGNOSIS — E113511 Type 2 diabetes mellitus with proliferative diabetic retinopathy with macular edema, right eye: Secondary | ICD-10-CM | POA: Diagnosis not present

## 2020-02-25 DIAGNOSIS — Z992 Dependence on renal dialysis: Secondary | ICD-10-CM | POA: Diagnosis not present

## 2020-02-25 DIAGNOSIS — N186 End stage renal disease: Secondary | ICD-10-CM | POA: Diagnosis not present

## 2020-02-28 DIAGNOSIS — D631 Anemia in chronic kidney disease: Secondary | ICD-10-CM | POA: Diagnosis not present

## 2020-02-28 DIAGNOSIS — K769 Liver disease, unspecified: Secondary | ICD-10-CM | POA: Diagnosis not present

## 2020-02-28 DIAGNOSIS — R17 Unspecified jaundice: Secondary | ICD-10-CM | POA: Diagnosis not present

## 2020-02-28 DIAGNOSIS — Z23 Encounter for immunization: Secondary | ICD-10-CM | POA: Diagnosis not present

## 2020-02-28 DIAGNOSIS — E1122 Type 2 diabetes mellitus with diabetic chronic kidney disease: Secondary | ICD-10-CM | POA: Diagnosis not present

## 2020-02-28 DIAGNOSIS — N2581 Secondary hyperparathyroidism of renal origin: Secondary | ICD-10-CM | POA: Diagnosis not present

## 2020-02-28 DIAGNOSIS — R82998 Other abnormal findings in urine: Secondary | ICD-10-CM | POA: Diagnosis not present

## 2020-02-28 DIAGNOSIS — Z992 Dependence on renal dialysis: Secondary | ICD-10-CM | POA: Diagnosis not present

## 2020-02-28 DIAGNOSIS — N186 End stage renal disease: Secondary | ICD-10-CM | POA: Diagnosis not present

## 2020-02-29 DIAGNOSIS — Z992 Dependence on renal dialysis: Secondary | ICD-10-CM | POA: Diagnosis not present

## 2020-02-29 DIAGNOSIS — R17 Unspecified jaundice: Secondary | ICD-10-CM | POA: Diagnosis not present

## 2020-02-29 DIAGNOSIS — N2581 Secondary hyperparathyroidism of renal origin: Secondary | ICD-10-CM | POA: Diagnosis not present

## 2020-02-29 DIAGNOSIS — N186 End stage renal disease: Secondary | ICD-10-CM | POA: Diagnosis not present

## 2020-02-29 DIAGNOSIS — Z7901 Long term (current) use of anticoagulants: Secondary | ICD-10-CM | POA: Insufficient documentation

## 2020-02-29 DIAGNOSIS — E1122 Type 2 diabetes mellitus with diabetic chronic kidney disease: Secondary | ICD-10-CM | POA: Diagnosis not present

## 2020-02-29 DIAGNOSIS — Z23 Encounter for immunization: Secondary | ICD-10-CM | POA: Diagnosis not present

## 2020-02-29 DIAGNOSIS — K769 Liver disease, unspecified: Secondary | ICD-10-CM | POA: Diagnosis not present

## 2020-02-29 DIAGNOSIS — D631 Anemia in chronic kidney disease: Secondary | ICD-10-CM | POA: Diagnosis not present

## 2020-02-29 DIAGNOSIS — R82998 Other abnormal findings in urine: Secondary | ICD-10-CM | POA: Diagnosis not present

## 2020-03-01 DIAGNOSIS — N186 End stage renal disease: Secondary | ICD-10-CM | POA: Diagnosis not present

## 2020-03-01 DIAGNOSIS — Z992 Dependence on renal dialysis: Secondary | ICD-10-CM | POA: Diagnosis not present

## 2020-03-01 DIAGNOSIS — N2581 Secondary hyperparathyroidism of renal origin: Secondary | ICD-10-CM | POA: Diagnosis not present

## 2020-03-02 DIAGNOSIS — N186 End stage renal disease: Secondary | ICD-10-CM | POA: Diagnosis not present

## 2020-03-02 DIAGNOSIS — E1122 Type 2 diabetes mellitus with diabetic chronic kidney disease: Secondary | ICD-10-CM | POA: Diagnosis not present

## 2020-03-02 DIAGNOSIS — D509 Iron deficiency anemia, unspecified: Secondary | ICD-10-CM | POA: Diagnosis not present

## 2020-03-02 DIAGNOSIS — D631 Anemia in chronic kidney disease: Secondary | ICD-10-CM | POA: Diagnosis not present

## 2020-03-02 DIAGNOSIS — R82998 Other abnormal findings in urine: Secondary | ICD-10-CM | POA: Diagnosis not present

## 2020-03-02 DIAGNOSIS — N2581 Secondary hyperparathyroidism of renal origin: Secondary | ICD-10-CM | POA: Diagnosis not present

## 2020-03-02 DIAGNOSIS — R17 Unspecified jaundice: Secondary | ICD-10-CM | POA: Diagnosis not present

## 2020-03-02 DIAGNOSIS — Z992 Dependence on renal dialysis: Secondary | ICD-10-CM | POA: Diagnosis not present

## 2020-03-02 DIAGNOSIS — K769 Liver disease, unspecified: Secondary | ICD-10-CM | POA: Diagnosis not present

## 2020-03-03 DIAGNOSIS — R82998 Other abnormal findings in urine: Secondary | ICD-10-CM | POA: Diagnosis not present

## 2020-03-03 DIAGNOSIS — Z992 Dependence on renal dialysis: Secondary | ICD-10-CM | POA: Diagnosis not present

## 2020-03-03 DIAGNOSIS — N2581 Secondary hyperparathyroidism of renal origin: Secondary | ICD-10-CM | POA: Diagnosis not present

## 2020-03-03 DIAGNOSIS — E1122 Type 2 diabetes mellitus with diabetic chronic kidney disease: Secondary | ICD-10-CM | POA: Diagnosis not present

## 2020-03-03 DIAGNOSIS — R17 Unspecified jaundice: Secondary | ICD-10-CM | POA: Diagnosis not present

## 2020-03-03 DIAGNOSIS — D631 Anemia in chronic kidney disease: Secondary | ICD-10-CM | POA: Diagnosis not present

## 2020-03-03 DIAGNOSIS — K769 Liver disease, unspecified: Secondary | ICD-10-CM | POA: Diagnosis not present

## 2020-03-03 DIAGNOSIS — N186 End stage renal disease: Secondary | ICD-10-CM | POA: Diagnosis not present

## 2020-03-03 DIAGNOSIS — D509 Iron deficiency anemia, unspecified: Secondary | ICD-10-CM | POA: Diagnosis not present

## 2020-03-06 DIAGNOSIS — D509 Iron deficiency anemia, unspecified: Secondary | ICD-10-CM | POA: Diagnosis not present

## 2020-03-06 DIAGNOSIS — D631 Anemia in chronic kidney disease: Secondary | ICD-10-CM | POA: Diagnosis not present

## 2020-03-06 DIAGNOSIS — R17 Unspecified jaundice: Secondary | ICD-10-CM | POA: Diagnosis not present

## 2020-03-06 DIAGNOSIS — Z992 Dependence on renal dialysis: Secondary | ICD-10-CM | POA: Diagnosis not present

## 2020-03-06 DIAGNOSIS — N2581 Secondary hyperparathyroidism of renal origin: Secondary | ICD-10-CM | POA: Diagnosis not present

## 2020-03-06 DIAGNOSIS — N186 End stage renal disease: Secondary | ICD-10-CM | POA: Diagnosis not present

## 2020-03-06 DIAGNOSIS — K769 Liver disease, unspecified: Secondary | ICD-10-CM | POA: Diagnosis not present

## 2020-03-06 DIAGNOSIS — E1122 Type 2 diabetes mellitus with diabetic chronic kidney disease: Secondary | ICD-10-CM | POA: Diagnosis not present

## 2020-03-06 DIAGNOSIS — R82998 Other abnormal findings in urine: Secondary | ICD-10-CM | POA: Diagnosis not present

## 2020-03-07 DIAGNOSIS — E1122 Type 2 diabetes mellitus with diabetic chronic kidney disease: Secondary | ICD-10-CM | POA: Diagnosis not present

## 2020-03-07 DIAGNOSIS — K769 Liver disease, unspecified: Secondary | ICD-10-CM | POA: Diagnosis not present

## 2020-03-07 DIAGNOSIS — D631 Anemia in chronic kidney disease: Secondary | ICD-10-CM | POA: Diagnosis not present

## 2020-03-07 DIAGNOSIS — R17 Unspecified jaundice: Secondary | ICD-10-CM | POA: Diagnosis not present

## 2020-03-07 DIAGNOSIS — N186 End stage renal disease: Secondary | ICD-10-CM | POA: Diagnosis not present

## 2020-03-07 DIAGNOSIS — N2581 Secondary hyperparathyroidism of renal origin: Secondary | ICD-10-CM | POA: Diagnosis not present

## 2020-03-07 DIAGNOSIS — R82998 Other abnormal findings in urine: Secondary | ICD-10-CM | POA: Diagnosis not present

## 2020-03-07 DIAGNOSIS — D509 Iron deficiency anemia, unspecified: Secondary | ICD-10-CM | POA: Diagnosis not present

## 2020-03-07 DIAGNOSIS — Z992 Dependence on renal dialysis: Secondary | ICD-10-CM | POA: Diagnosis not present

## 2020-03-08 DIAGNOSIS — Z992 Dependence on renal dialysis: Secondary | ICD-10-CM | POA: Diagnosis not present

## 2020-03-08 DIAGNOSIS — D509 Iron deficiency anemia, unspecified: Secondary | ICD-10-CM | POA: Diagnosis not present

## 2020-03-08 DIAGNOSIS — N2581 Secondary hyperparathyroidism of renal origin: Secondary | ICD-10-CM | POA: Diagnosis not present

## 2020-03-08 DIAGNOSIS — K769 Liver disease, unspecified: Secondary | ICD-10-CM | POA: Diagnosis not present

## 2020-03-08 DIAGNOSIS — D631 Anemia in chronic kidney disease: Secondary | ICD-10-CM | POA: Diagnosis not present

## 2020-03-08 DIAGNOSIS — E1122 Type 2 diabetes mellitus with diabetic chronic kidney disease: Secondary | ICD-10-CM | POA: Diagnosis not present

## 2020-03-08 DIAGNOSIS — N186 End stage renal disease: Secondary | ICD-10-CM | POA: Diagnosis not present

## 2020-03-08 DIAGNOSIS — R82998 Other abnormal findings in urine: Secondary | ICD-10-CM | POA: Diagnosis not present

## 2020-03-08 DIAGNOSIS — R17 Unspecified jaundice: Secondary | ICD-10-CM | POA: Diagnosis not present

## 2020-03-09 DIAGNOSIS — D631 Anemia in chronic kidney disease: Secondary | ICD-10-CM | POA: Diagnosis not present

## 2020-03-09 DIAGNOSIS — K769 Liver disease, unspecified: Secondary | ICD-10-CM | POA: Diagnosis not present

## 2020-03-09 DIAGNOSIS — Z992 Dependence on renal dialysis: Secondary | ICD-10-CM | POA: Diagnosis not present

## 2020-03-09 DIAGNOSIS — N2581 Secondary hyperparathyroidism of renal origin: Secondary | ICD-10-CM | POA: Diagnosis not present

## 2020-03-09 DIAGNOSIS — E1122 Type 2 diabetes mellitus with diabetic chronic kidney disease: Secondary | ICD-10-CM | POA: Diagnosis not present

## 2020-03-09 DIAGNOSIS — N186 End stage renal disease: Secondary | ICD-10-CM | POA: Diagnosis not present

## 2020-03-09 DIAGNOSIS — R17 Unspecified jaundice: Secondary | ICD-10-CM | POA: Diagnosis not present

## 2020-03-09 DIAGNOSIS — D509 Iron deficiency anemia, unspecified: Secondary | ICD-10-CM | POA: Diagnosis not present

## 2020-03-09 DIAGNOSIS — R82998 Other abnormal findings in urine: Secondary | ICD-10-CM | POA: Diagnosis not present

## 2020-03-10 DIAGNOSIS — N186 End stage renal disease: Secondary | ICD-10-CM | POA: Diagnosis not present

## 2020-03-10 DIAGNOSIS — D631 Anemia in chronic kidney disease: Secondary | ICD-10-CM | POA: Diagnosis not present

## 2020-03-10 DIAGNOSIS — Z992 Dependence on renal dialysis: Secondary | ICD-10-CM | POA: Diagnosis not present

## 2020-03-10 DIAGNOSIS — N2581 Secondary hyperparathyroidism of renal origin: Secondary | ICD-10-CM | POA: Diagnosis not present

## 2020-03-11 DIAGNOSIS — N186 End stage renal disease: Secondary | ICD-10-CM | POA: Diagnosis not present

## 2020-03-11 DIAGNOSIS — D631 Anemia in chronic kidney disease: Secondary | ICD-10-CM | POA: Diagnosis not present

## 2020-03-11 DIAGNOSIS — N2581 Secondary hyperparathyroidism of renal origin: Secondary | ICD-10-CM | POA: Diagnosis not present

## 2020-03-11 DIAGNOSIS — Z992 Dependence on renal dialysis: Secondary | ICD-10-CM | POA: Diagnosis not present

## 2020-03-12 DIAGNOSIS — N186 End stage renal disease: Secondary | ICD-10-CM | POA: Diagnosis not present

## 2020-03-12 DIAGNOSIS — N2581 Secondary hyperparathyroidism of renal origin: Secondary | ICD-10-CM | POA: Diagnosis not present

## 2020-03-12 DIAGNOSIS — D631 Anemia in chronic kidney disease: Secondary | ICD-10-CM | POA: Diagnosis not present

## 2020-03-12 DIAGNOSIS — Z992 Dependence on renal dialysis: Secondary | ICD-10-CM | POA: Diagnosis not present

## 2020-03-13 DIAGNOSIS — N186 End stage renal disease: Secondary | ICD-10-CM | POA: Diagnosis not present

## 2020-03-13 DIAGNOSIS — N2581 Secondary hyperparathyroidism of renal origin: Secondary | ICD-10-CM | POA: Diagnosis not present

## 2020-03-13 DIAGNOSIS — Z992 Dependence on renal dialysis: Secondary | ICD-10-CM | POA: Diagnosis not present

## 2020-03-13 DIAGNOSIS — D631 Anemia in chronic kidney disease: Secondary | ICD-10-CM | POA: Diagnosis not present

## 2020-03-14 DIAGNOSIS — D631 Anemia in chronic kidney disease: Secondary | ICD-10-CM | POA: Diagnosis not present

## 2020-03-14 DIAGNOSIS — Z992 Dependence on renal dialysis: Secondary | ICD-10-CM | POA: Diagnosis not present

## 2020-03-14 DIAGNOSIS — N186 End stage renal disease: Secondary | ICD-10-CM | POA: Diagnosis not present

## 2020-03-14 DIAGNOSIS — N2581 Secondary hyperparathyroidism of renal origin: Secondary | ICD-10-CM | POA: Diagnosis not present

## 2020-03-15 DIAGNOSIS — Z992 Dependence on renal dialysis: Secondary | ICD-10-CM | POA: Diagnosis not present

## 2020-03-15 DIAGNOSIS — N186 End stage renal disease: Secondary | ICD-10-CM | POA: Diagnosis not present

## 2020-03-15 DIAGNOSIS — D631 Anemia in chronic kidney disease: Secondary | ICD-10-CM | POA: Diagnosis not present

## 2020-03-15 DIAGNOSIS — N2581 Secondary hyperparathyroidism of renal origin: Secondary | ICD-10-CM | POA: Diagnosis not present

## 2020-03-16 DIAGNOSIS — Z992 Dependence on renal dialysis: Secondary | ICD-10-CM | POA: Diagnosis not present

## 2020-03-16 DIAGNOSIS — R300 Dysuria: Secondary | ICD-10-CM | POA: Diagnosis not present

## 2020-03-16 DIAGNOSIS — D631 Anemia in chronic kidney disease: Secondary | ICD-10-CM | POA: Diagnosis not present

## 2020-03-16 DIAGNOSIS — N2581 Secondary hyperparathyroidism of renal origin: Secondary | ICD-10-CM | POA: Diagnosis not present

## 2020-03-16 DIAGNOSIS — N186 End stage renal disease: Secondary | ICD-10-CM | POA: Diagnosis not present

## 2020-03-17 DIAGNOSIS — N186 End stage renal disease: Secondary | ICD-10-CM | POA: Diagnosis not present

## 2020-03-17 DIAGNOSIS — D631 Anemia in chronic kidney disease: Secondary | ICD-10-CM | POA: Diagnosis not present

## 2020-03-17 DIAGNOSIS — Z992 Dependence on renal dialysis: Secondary | ICD-10-CM | POA: Diagnosis not present

## 2020-03-17 DIAGNOSIS — N2581 Secondary hyperparathyroidism of renal origin: Secondary | ICD-10-CM | POA: Diagnosis not present

## 2020-03-18 DIAGNOSIS — N186 End stage renal disease: Secondary | ICD-10-CM | POA: Diagnosis not present

## 2020-03-18 DIAGNOSIS — Z992 Dependence on renal dialysis: Secondary | ICD-10-CM | POA: Diagnosis not present

## 2020-03-18 DIAGNOSIS — D631 Anemia in chronic kidney disease: Secondary | ICD-10-CM | POA: Diagnosis not present

## 2020-03-18 DIAGNOSIS — N2581 Secondary hyperparathyroidism of renal origin: Secondary | ICD-10-CM | POA: Diagnosis not present

## 2020-03-19 DIAGNOSIS — N186 End stage renal disease: Secondary | ICD-10-CM | POA: Diagnosis not present

## 2020-03-19 DIAGNOSIS — N2581 Secondary hyperparathyroidism of renal origin: Secondary | ICD-10-CM | POA: Diagnosis not present

## 2020-03-19 DIAGNOSIS — D631 Anemia in chronic kidney disease: Secondary | ICD-10-CM | POA: Diagnosis not present

## 2020-03-19 DIAGNOSIS — Z992 Dependence on renal dialysis: Secondary | ICD-10-CM | POA: Diagnosis not present

## 2020-03-20 DIAGNOSIS — Z992 Dependence on renal dialysis: Secondary | ICD-10-CM | POA: Diagnosis not present

## 2020-03-20 DIAGNOSIS — N2581 Secondary hyperparathyroidism of renal origin: Secondary | ICD-10-CM | POA: Diagnosis not present

## 2020-03-20 DIAGNOSIS — N186 End stage renal disease: Secondary | ICD-10-CM | POA: Diagnosis not present

## 2020-03-20 DIAGNOSIS — D631 Anemia in chronic kidney disease: Secondary | ICD-10-CM | POA: Diagnosis not present

## 2020-03-21 DIAGNOSIS — Z992 Dependence on renal dialysis: Secondary | ICD-10-CM | POA: Diagnosis not present

## 2020-03-21 DIAGNOSIS — N186 End stage renal disease: Secondary | ICD-10-CM | POA: Diagnosis not present

## 2020-03-21 DIAGNOSIS — D631 Anemia in chronic kidney disease: Secondary | ICD-10-CM | POA: Diagnosis not present

## 2020-03-21 DIAGNOSIS — N2581 Secondary hyperparathyroidism of renal origin: Secondary | ICD-10-CM | POA: Diagnosis not present

## 2020-03-22 DIAGNOSIS — N2581 Secondary hyperparathyroidism of renal origin: Secondary | ICD-10-CM | POA: Diagnosis not present

## 2020-03-22 DIAGNOSIS — R100 Acute abdomen: Secondary | ICD-10-CM | POA: Diagnosis not present

## 2020-03-22 DIAGNOSIS — N186 End stage renal disease: Secondary | ICD-10-CM | POA: Diagnosis not present

## 2020-03-22 DIAGNOSIS — Z4902 Encounter for fitting and adjustment of peritoneal dialysis catheter: Secondary | ICD-10-CM | POA: Diagnosis not present

## 2020-03-22 DIAGNOSIS — D631 Anemia in chronic kidney disease: Secondary | ICD-10-CM | POA: Diagnosis not present

## 2020-03-22 DIAGNOSIS — Z452 Encounter for adjustment and management of vascular access device: Secondary | ICD-10-CM | POA: Diagnosis not present

## 2020-03-22 DIAGNOSIS — Z992 Dependence on renal dialysis: Secondary | ICD-10-CM | POA: Diagnosis not present

## 2020-03-23 DIAGNOSIS — D631 Anemia in chronic kidney disease: Secondary | ICD-10-CM | POA: Diagnosis not present

## 2020-03-23 DIAGNOSIS — N186 End stage renal disease: Secondary | ICD-10-CM | POA: Diagnosis not present

## 2020-03-23 DIAGNOSIS — Z992 Dependence on renal dialysis: Secondary | ICD-10-CM | POA: Diagnosis not present

## 2020-03-23 DIAGNOSIS — N2581 Secondary hyperparathyroidism of renal origin: Secondary | ICD-10-CM | POA: Diagnosis not present

## 2020-03-24 ENCOUNTER — Emergency Department (HOSPITAL_COMMUNITY): Payer: Medicare Other

## 2020-03-24 ENCOUNTER — Encounter (HOSPITAL_COMMUNITY): Payer: Self-pay | Admitting: Pediatrics

## 2020-03-24 ENCOUNTER — Other Ambulatory Visit: Payer: Self-pay

## 2020-03-24 ENCOUNTER — Inpatient Hospital Stay (HOSPITAL_COMMUNITY)
Admission: EM | Admit: 2020-03-24 | Discharge: 2020-03-26 | DRG: 312 | Disposition: A | Discharge status: Home / Self Care | Payer: Medicare Other | Attending: Internal Medicine | Admitting: Internal Medicine

## 2020-03-24 DIAGNOSIS — I480 Paroxysmal atrial fibrillation: Secondary | ICD-10-CM | POA: Diagnosis present

## 2020-03-24 DIAGNOSIS — Z87891 Personal history of nicotine dependence: Secondary | ICD-10-CM | POA: Diagnosis not present

## 2020-03-24 DIAGNOSIS — G894 Chronic pain syndrome: Secondary | ICD-10-CM

## 2020-03-24 DIAGNOSIS — N184 Chronic kidney disease, stage 4 (severe): Secondary | ICD-10-CM

## 2020-03-24 DIAGNOSIS — Z6829 Body mass index (BMI) 29.0-29.9, adult: Secondary | ICD-10-CM

## 2020-03-24 DIAGNOSIS — I132 Hypertensive heart and chronic kidney disease with heart failure and with stage 5 chronic kidney disease, or end stage renal disease: Secondary | ICD-10-CM | POA: Diagnosis not present

## 2020-03-24 DIAGNOSIS — M109 Gout, unspecified: Secondary | ICD-10-CM | POA: Diagnosis present

## 2020-03-24 DIAGNOSIS — E1140 Type 2 diabetes mellitus with diabetic neuropathy, unspecified: Secondary | ICD-10-CM | POA: Diagnosis not present

## 2020-03-24 DIAGNOSIS — N2581 Secondary hyperparathyroidism of renal origin: Secondary | ICD-10-CM | POA: Diagnosis present

## 2020-03-24 DIAGNOSIS — R531 Weakness: Secondary | ICD-10-CM | POA: Diagnosis not present

## 2020-03-24 DIAGNOSIS — Z992 Dependence on renal dialysis: Secondary | ICD-10-CM

## 2020-03-24 DIAGNOSIS — Z7982 Long term (current) use of aspirin: Secondary | ICD-10-CM | POA: Diagnosis not present

## 2020-03-24 DIAGNOSIS — E119 Type 2 diabetes mellitus without complications: Secondary | ICD-10-CM

## 2020-03-24 DIAGNOSIS — E1122 Type 2 diabetes mellitus with diabetic chronic kidney disease: Secondary | ICD-10-CM | POA: Diagnosis not present

## 2020-03-24 DIAGNOSIS — I50811 Acute right heart failure: Secondary | ICD-10-CM

## 2020-03-24 DIAGNOSIS — Y929 Unspecified place or not applicable: Secondary | ICD-10-CM | POA: Diagnosis not present

## 2020-03-24 DIAGNOSIS — I5040 Unspecified combined systolic (congestive) and diastolic (congestive) heart failure: Secondary | ICD-10-CM | POA: Diagnosis not present

## 2020-03-24 DIAGNOSIS — R110 Nausea: Secondary | ICD-10-CM | POA: Diagnosis not present

## 2020-03-24 DIAGNOSIS — E785 Hyperlipidemia, unspecified: Secondary | ICD-10-CM | POA: Diagnosis not present

## 2020-03-24 DIAGNOSIS — Z8249 Family history of ischemic heart disease and other diseases of the circulatory system: Secondary | ICD-10-CM

## 2020-03-24 DIAGNOSIS — Z794 Long term (current) use of insulin: Secondary | ICD-10-CM | POA: Diagnosis not present

## 2020-03-24 DIAGNOSIS — G629 Polyneuropathy, unspecified: Secondary | ICD-10-CM

## 2020-03-24 DIAGNOSIS — I120 Hypertensive chronic kidney disease with stage 5 chronic kidney disease or end stage renal disease: Secondary | ICD-10-CM | POA: Diagnosis not present

## 2020-03-24 DIAGNOSIS — R109 Unspecified abdominal pain: Secondary | ICD-10-CM | POA: Diagnosis not present

## 2020-03-24 DIAGNOSIS — E869 Volume depletion, unspecified: Secondary | ICD-10-CM | POA: Diagnosis present

## 2020-03-24 DIAGNOSIS — N186 End stage renal disease: Secondary | ICD-10-CM | POA: Diagnosis present

## 2020-03-24 DIAGNOSIS — D631 Anemia in chronic kidney disease: Secondary | ICD-10-CM | POA: Diagnosis present

## 2020-03-24 DIAGNOSIS — I10 Essential (primary) hypertension: Secondary | ICD-10-CM | POA: Diagnosis not present

## 2020-03-24 DIAGNOSIS — R634 Abnormal weight loss: Secondary | ICD-10-CM | POA: Diagnosis present

## 2020-03-24 DIAGNOSIS — R112 Nausea with vomiting, unspecified: Secondary | ICD-10-CM

## 2020-03-24 DIAGNOSIS — E876 Hypokalemia: Secondary | ICD-10-CM | POA: Diagnosis not present

## 2020-03-24 DIAGNOSIS — I252 Old myocardial infarction: Secondary | ICD-10-CM

## 2020-03-24 DIAGNOSIS — Z8572 Personal history of non-Hodgkin lymphomas: Secondary | ICD-10-CM | POA: Diagnosis not present

## 2020-03-24 DIAGNOSIS — R55 Syncope and collapse: Secondary | ICD-10-CM | POA: Diagnosis present

## 2020-03-24 DIAGNOSIS — G2581 Restless legs syndrome: Secondary | ICD-10-CM | POA: Diagnosis present

## 2020-03-24 DIAGNOSIS — E860 Dehydration: Secondary | ICD-10-CM | POA: Diagnosis present

## 2020-03-24 DIAGNOSIS — I951 Orthostatic hypotension: Principal | ICD-10-CM

## 2020-03-24 DIAGNOSIS — I5042 Chronic combined systolic (congestive) and diastolic (congestive) heart failure: Secondary | ICD-10-CM | POA: Diagnosis present

## 2020-03-24 DIAGNOSIS — T502X5A Adverse effect of carbonic-anhydrase inhibitors, benzothiadiazides and other diuretics, initial encounter: Secondary | ICD-10-CM | POA: Diagnosis not present

## 2020-03-24 DIAGNOSIS — E1169 Type 2 diabetes mellitus with other specified complication: Secondary | ICD-10-CM

## 2020-03-24 DIAGNOSIS — I509 Heart failure, unspecified: Secondary | ICD-10-CM

## 2020-03-24 DIAGNOSIS — Z20822 Contact with and (suspected) exposure to covid-19: Secondary | ICD-10-CM | POA: Diagnosis present

## 2020-03-24 DIAGNOSIS — I83899 Varicose veins of unspecified lower extremities with other complications: Secondary | ICD-10-CM

## 2020-03-24 LAB — CBC
HCT: 32.8 % — ABNORMAL LOW (ref 39.0–52.0)
HCT: 31.3 % — ABNORMAL LOW (ref 39.0–52.0)
Hemoglobin: 11.5 g/dL — ABNORMAL LOW (ref 13.0–17.0)
Hemoglobin: 11.1 g/dL — ABNORMAL LOW (ref 13.0–17.0)
MCH: 31.9 pg (ref 26.0–34.0)
MCH: 32.1 pg (ref 26.0–34.0)
MCHC: 35.1 g/dL (ref 30.0–36.0)
MCHC: 35.5 g/dL (ref 30.0–36.0)
MCV: 90.9 fL (ref 80.0–100.0)
MCV: 90.5 fL (ref 80.0–100.0)
Platelets: 174 10*3/uL (ref 150–400)
Platelets: 164 10*3/uL (ref 150–400)
RBC: 3.61 MIL/uL — ABNORMAL LOW (ref 4.22–5.81)
RBC: 3.46 MIL/uL — ABNORMAL LOW (ref 4.22–5.81)
RDW: 14 % (ref 11.5–15.5)
RDW: 14.1 % (ref 11.5–15.5)
WBC: 9.7 10*3/uL (ref 4.0–10.5)
WBC: 8.2 10*3/uL (ref 4.0–10.5)
nRBC: 0 % (ref 0.0–0.2)
nRBC: 0 % (ref 0.0–0.2)

## 2020-03-24 LAB — HIV ANTIBODY (ROUTINE TESTING W REFLEX): HIV Screen 4th Generation wRfx: NONREACTIVE

## 2020-03-24 LAB — URINALYSIS, ROUTINE W REFLEX MICROSCOPIC
Bacteria, UA: NONE SEEN
Bilirubin Urine: NEGATIVE
Glucose, UA: 50 mg/dL — AB
Hgb urine dipstick: NEGATIVE
Ketones, ur: NEGATIVE mg/dL
Leukocytes,Ua: NEGATIVE
Nitrite: NEGATIVE
Protein, ur: 30 mg/dL — AB
Specific Gravity, Urine: 1.011 (ref 1.005–1.030)
pH: 5 (ref 5.0–8.0)

## 2020-03-24 LAB — COMPREHENSIVE METABOLIC PANEL
ALT: 34 U/L (ref 0–44)
AST: 26 U/L (ref 15–41)
Albumin: 3.4 g/dL — ABNORMAL LOW (ref 3.5–5.0)
Alkaline Phosphatase: 87 U/L (ref 38–126)
Anion gap: 14 (ref 5–15)
BUN: 70 mg/dL — ABNORMAL HIGH (ref 8–23)
CO2: 24 mmol/L (ref 22–32)
Calcium: 10.1 mg/dL (ref 8.9–10.3)
Chloride: 100 mmol/L (ref 98–111)
Creatinine, Ser: 4.78 mg/dL — ABNORMAL HIGH (ref 0.61–1.24)
GFR calc Af Amer: 14 mL/min — ABNORMAL LOW (ref >60)
GFR calc non Af Amer: 12 mL/min — ABNORMAL LOW (ref >60)
Glucose, Bld: 234 mg/dL — ABNORMAL HIGH (ref 70–99)
Potassium: 3 mmol/L — ABNORMAL LOW (ref 3.5–5.1)
Sodium: 138 mmol/L (ref 135–145)
Total Bilirubin: 1 mg/dL (ref 0.3–1.2)
Total Protein: 7.4 g/dL (ref 6.5–8.1)

## 2020-03-24 LAB — CBG MONITORING, ED: Glucose-Capillary: 167 mg/dL — ABNORMAL HIGH (ref 70–99)

## 2020-03-24 LAB — CREATININE, SERUM
Creatinine, Ser: 4.9 mg/dL — ABNORMAL HIGH (ref 0.61–1.24)
GFR calc Af Amer: 14 mL/min — ABNORMAL LOW (ref >60)
GFR calc non Af Amer: 12 mL/min — ABNORMAL LOW (ref >60)

## 2020-03-24 LAB — LIPASE, BLOOD: Lipase: 39 U/L (ref 11–51)

## 2020-03-24 LAB — SARS CORONAVIRUS 2 BY RT PCR (HOSPITAL ORDER, PERFORMED IN ~~LOC~~ HOSPITAL LAB): SARS Coronavirus 2: NEGATIVE

## 2020-03-24 MED ORDER — CALCITRIOL 0.5 MCG PO CAPS
0.5000 ug | ORAL_CAPSULE | ORAL | Status: DC
  Administered 2020-03-26: 0.5 ug via ORAL
  Filled 2020-03-24: qty 1
  Filled 2020-03-24: qty 2

## 2020-03-24 MED ORDER — HYDROCODONE-ACETAMINOPHEN 5-325 MG PO TABS
1.0000 | ORAL_TABLET | Freq: Four times a day (QID) | ORAL | Status: DC | PRN
  Administered 2020-03-25 (×2): 1 via ORAL
  Filled 2020-03-24 (×2): qty 1

## 2020-03-24 MED ORDER — CARVEDILOL 3.125 MG PO TABS
3.1250 mg | ORAL_TABLET | Freq: Every day | ORAL | Status: DC
  Filled 2020-03-24: qty 1

## 2020-03-24 MED ORDER — ACETAMINOPHEN 650 MG RE SUPP: 650.0000 mg | Freq: Four times a day (QID) | RECTAL | Status: DC | PRN

## 2020-03-24 MED ORDER — ALLOPURINOL 100 MG PO TABS
100.0000 mg | ORAL_TABLET | Freq: Every day | ORAL | Status: DC
  Administered 2020-03-25 – 2020-03-26 (×2): 100 mg via ORAL
  Filled 2020-03-24 (×2): qty 1

## 2020-03-24 MED ORDER — SODIUM CHLORIDE 0.9% FLUSH
3.0000 mL | Freq: Once | INTRAVENOUS | Status: AC
  Administered 2020-03-24: 3 mL via INTRAVENOUS

## 2020-03-24 MED ORDER — HEPARIN SODIUM (PORCINE) 5000 UNIT/ML IJ SOLN
5000.0000 [IU] | Freq: Three times a day (TID) | INTRAMUSCULAR | Status: DC
  Administered 2020-03-24 – 2020-03-26 (×5): 5000 [IU] via SUBCUTANEOUS
  Filled 2020-03-24 (×5): qty 1

## 2020-03-24 MED ORDER — ASPIRIN EC 81 MG PO TBEC
81.0000 mg | DELAYED_RELEASE_TABLET | Freq: Every day | ORAL | Status: DC
  Administered 2020-03-25 – 2020-03-26 (×2): 81 mg via ORAL
  Filled 2020-03-24 (×2): qty 1

## 2020-03-24 MED ORDER — AMIODARONE HCL 200 MG PO TABS
200.0000 mg | ORAL_TABLET | Freq: Every day | ORAL | Status: DC
  Administered 2020-03-25 – 2020-03-26 (×2): 200 mg via ORAL
  Filled 2020-03-24 (×2): qty 1

## 2020-03-24 MED ORDER — ATORVASTATIN CALCIUM 40 MG PO TABS: 40.0000 mg | ORAL_TABLET | Freq: Every day | ORAL | Status: DC

## 2020-03-24 MED ORDER — ZOLPIDEM TARTRATE 5 MG PO TABS
10.0000 mg | ORAL_TABLET | Freq: Every day | ORAL | Status: DC
  Administered 2020-03-25: 10 mg via ORAL
  Filled 2020-03-24 (×2): qty 2

## 2020-03-24 MED ORDER — HYDRALAZINE HCL 25 MG PO TABS
100.0000 mg | ORAL_TABLET | Freq: Two times a day (BID) | ORAL | Status: DC
  Filled 2020-03-24 (×2): qty 4

## 2020-03-24 MED ORDER — INSULIN GLARGINE 100 UNIT/ML ~~LOC~~ SOLN
50.0000 [IU] | Freq: Every day | SUBCUTANEOUS | Status: DC
  Filled 2020-03-24: qty 0.5

## 2020-03-24 MED ORDER — ACETAMINOPHEN 325 MG PO TABS: 650.0000 mg | ORAL_TABLET | Freq: Four times a day (QID) | ORAL | Status: DC | PRN

## 2020-03-24 MED ORDER — ZOLPIDEM TARTRATE 5 MG PO TABS
10.0000 mg | ORAL_TABLET | Freq: Every evening | ORAL | Status: DC | PRN
  Administered 2020-03-25: 10 mg via ORAL

## 2020-03-24 MED ORDER — ISOSORBIDE MONONITRATE ER 30 MG PO TB24: 60.0000 mg | ORAL_TABLET | Freq: Every day | ORAL | Status: DC

## 2020-03-24 MED ORDER — GABAPENTIN 600 MG PO TABS
600.0000 mg | ORAL_TABLET | Freq: Two times a day (BID) | ORAL | Status: DC
  Administered 2020-03-25 (×2): 600 mg via ORAL
  Filled 2020-03-24 (×2): qty 1

## 2020-03-24 MED ORDER — ISOSORBIDE MONONITRATE ER 30 MG PO TB24
30.0000 mg | ORAL_TABLET | Freq: Every day | ORAL | Status: DC
  Administered 2020-03-25 – 2020-03-26 (×2): 30 mg via ORAL
  Filled 2020-03-24 (×2): qty 1

## 2020-03-24 NOTE — ED Notes (Signed)
RN paged MD Chotiner who is on call , r/t pt requesting his nightly ambien, RN awaiting call back or order to be placed. RN will continue to monitor

## 2020-03-24 NOTE — ED Triage Notes (Signed)
Patient c/o unable to eat the last week d/t abdominal pain + nausea and vomiting. Stated he also feels weak. Patient endorsed peritoneal dialysis every night and was advised to skip two nights ago d/t weakness.
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
## 2020-03-24 NOTE — ED Notes (Signed)
RN received call from MD Chotiner , who states he will place for Ambien but MD Swayze needs to be paged due to pt having no notes, plan or sign and held orders. MD Chotiner just wants to confirm pt does not go unseen.
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
## 2020-03-25 ENCOUNTER — Inpatient Hospital Stay (HOSPITAL_COMMUNITY): Payer: Medicare Other

## 2020-03-25 DIAGNOSIS — R110 Nausea: Secondary | ICD-10-CM

## 2020-03-25 DIAGNOSIS — Z992 Dependence on renal dialysis: Secondary | ICD-10-CM

## 2020-03-25 DIAGNOSIS — N186 End stage renal disease: Secondary | ICD-10-CM

## 2020-03-25 DIAGNOSIS — I951 Orthostatic hypotension: Secondary | ICD-10-CM | POA: Diagnosis present

## 2020-03-25 LAB — CBC
HCT: 27.9 % — ABNORMAL LOW (ref 39.0–52.0)
Hemoglobin: 10.1 g/dL — ABNORMAL LOW (ref 13.0–17.0)
MCH: 32.4 pg (ref 26.0–34.0)
MCHC: 36.2 g/dL — ABNORMAL HIGH (ref 30.0–36.0)
MCV: 89.4 fL (ref 80.0–100.0)
Platelets: 150 10*3/uL (ref 150–400)
RBC: 3.12 MIL/uL — ABNORMAL LOW (ref 4.22–5.81)
RDW: 13.8 % (ref 11.5–15.5)
WBC: 8.6 10*3/uL (ref 4.0–10.5)
nRBC: 0 % (ref 0.0–0.2)

## 2020-03-25 LAB — COMPREHENSIVE METABOLIC PANEL
ALT: 28 U/L (ref 0–44)
AST: 24 U/L (ref 15–41)
Albumin: 3 g/dL — ABNORMAL LOW (ref 3.5–5.0)
Alkaline Phosphatase: 74 U/L (ref 38–126)
Anion gap: 13 (ref 5–15)
BUN: 73 mg/dL — ABNORMAL HIGH (ref 8–23)
CO2: 23 mmol/L (ref 22–32)
Calcium: 9.7 mg/dL (ref 8.9–10.3)
Chloride: 102 mmol/L (ref 98–111)
Creatinine, Ser: 4.93 mg/dL — ABNORMAL HIGH (ref 0.61–1.24)
GFR calc Af Amer: 14 mL/min — ABNORMAL LOW (ref >60)
GFR calc non Af Amer: 12 mL/min — ABNORMAL LOW (ref >60)
Glucose, Bld: 147 mg/dL — ABNORMAL HIGH (ref 70–99)
Potassium: 2.6 mmol/L — CL (ref 3.5–5.1)
Sodium: 138 mmol/L (ref 135–145)
Total Bilirubin: 0.9 mg/dL (ref 0.3–1.2)
Total Protein: 6.5 g/dL (ref 6.5–8.1)

## 2020-03-25 LAB — HEMOGLOBIN A1C
Hgb A1c MFr Bld: 6.4 % — ABNORMAL HIGH (ref 4.8–5.6)
Mean Plasma Glucose: 136.98 mg/dL

## 2020-03-25 LAB — GLUCOSE, CAPILLARY
Glucose-Capillary: 143 mg/dL — ABNORMAL HIGH (ref 70–99)
Glucose-Capillary: 195 mg/dL — ABNORMAL HIGH (ref 70–99)
Glucose-Capillary: 206 mg/dL — ABNORMAL HIGH (ref 70–99)

## 2020-03-25 MED ORDER — DELFLEX-LC/1.5% DEXTROSE 344 MOSM/L IP SOLN: INTRAPERITONEAL | Status: DC

## 2020-03-25 MED ORDER — INSULIN ASPART 100 UNIT/ML ~~LOC~~ SOLN
0.0000 [IU] | Freq: Three times a day (TID) | SUBCUTANEOUS | Status: DC
  Administered 2020-03-25 – 2020-03-26 (×2): 1 [IU] via SUBCUTANEOUS

## 2020-03-25 MED ORDER — POTASSIUM CHLORIDE CRYS ER 20 MEQ PO TBCR
40.0000 meq | EXTENDED_RELEASE_TABLET | Freq: Two times a day (BID) | ORAL | Status: DC
  Administered 2020-03-25 – 2020-03-26 (×3): 40 meq via ORAL
  Filled 2020-03-25 (×3): qty 2

## 2020-03-25 MED ORDER — INSULIN GLARGINE 100 UNIT/ML ~~LOC~~ SOLN
20.0000 [IU] | Freq: Every day | SUBCUTANEOUS | Status: DC
  Administered 2020-03-25: 20 [IU] via SUBCUTANEOUS
  Filled 2020-03-25 (×3): qty 0.2

## 2020-03-25 MED ORDER — GABAPENTIN 600 MG PO TABS
300.0000 mg | ORAL_TABLET | Freq: Every day | ORAL | Status: DC
  Administered 2020-03-26: 300 mg via ORAL
  Filled 2020-03-25: qty 1

## 2020-03-25 MED ORDER — GENTAMICIN SULFATE 0.1 % EX CREA
1.0000 "application " | TOPICAL_CREAM | Freq: Every day | CUTANEOUS | Status: DC
  Filled 2020-03-25: qty 15

## 2020-03-25 NOTE — Progress Notes (Signed)
Potassium level 2.6 ,Kyle MD paged via [facility]

## 2020-03-25 NOTE — Progress Notes (Signed)
PROGRESS NOTE    Curtis Mays  IRJ:[id number] DOB: 25-Aug-1961 DOA: 03/24/2020 PCP: Helen Hashimoto., MD   Brief Narrative:   The patient is a 59 yr old man who presented to the ED due to 3 days of nausea and vomiting. He also states that he has felt a little dizzy with standing. The patient carries a past medical history significant fo CKD IV for which he is on PD. The patient still makes some urine. However, his nausea and vomiting has continued.   7/24: Denies complaints this morning. K+ low. Replace. Appreciate neuro assistance. Let's hold some of these BP meds.  Assessment & Plan:   Principal Problem:   Orthostatic hypotension Active Problems:   DM2 (diabetes mellitus, type 2) (HCC)   Neuropathy   Chronic pain syndrome   Hyperlipidemia   HTN (hypertension)   CKD (chronic kidney disease), stage IV (HCC)   Acute CHF (congestive heart failure) (HCC)  Orthostatic hypotension     - Likely due to over-diuresis.      - Nephrology has been consulted and has planned to adjust the patient's PD to improve his volume status; PD held at admission, diuretics held at admission.  ESRD on PD     - Per nephrology  Hypokalemia     - replace, monitor  CAD/Hx NSTEMI/CHF     - Echocardiogram from [facility] on 11/16/2019 demonstrated a normal EF (55-60%) with normal function, no segmental wall motion abnormalities. There is dilatation of the left ventricle. Continue antianginals, and beta blockers. Diuretics and spironolactone are held. Monitor on telemetry.     - Imdur, ASA; hold coreg  Nausea     - Abdominal exam is benign.     - KUB negative     - N improved.  DM II     - HbA1c is 6.4.     - Lantus 20 units, SSI, DM diet, hypoglycemic protocol.  Hypertension     - BP improving; imdur, coreg and hydralazine were resumed at admission, hold coreg and hydralazine for now.  DVT prophylaxis: heparin Code Status: FULL Family Communication: None at bedside.   Status is:  Inpatient  Remains inpatient appropriate because:Inpatient level of care appropriate due to severity of illness   Dispo: The patient is from: Home              Anticipated d/c is to: Home              Anticipated d/c date is: 2 days              Patient currently is not medically stable to d/c.  Consultants:   Nephrology   ROS:  Reports weakness, N. Denies CP, V, dyspnea. Remainder ROS is negative for all not previously mentioned.  Subjective: "I never vomited. I was just nauseous."  Objective: Vitals:   03/25/20 0121 03/25/20 0357 03/25/20 0900 03/25/20 1217  BP: (!) 149/83 (!) 100/53  119/66  Pulse: 70 51 61 55  Resp: 15 14 13 13   Temp: 98.8 F (37.1 C) 97.7 F (36.5 C) (!) 97.5 F (36.4 C) 98.2 F (36.8 C)  TempSrc: Oral Oral Oral Oral  SpO2: 98% 96% 99% 99%  Weight: (!) 93.2 kg     Height: 5\' 11"  (1.803 m)       Intake/Output Summary (Last 24 hours) at 03/25/2020 1444 Last data filed at 03/25/2020 1218 Gross per 24 hour  Intake --  Output 550 ml  Net -550 ml  Filed Weights   03/24/20 1121 03/25/20 0121  Weight: (!) 97.5 kg (!) 93.2 kg    Examination:  General: 59 y.o. male resting in bed in NAD Cardiovascular: RRR, +S1, S2, no m/g/r Respiratory: CTABL, no w/r/r, normal WOB GI: BS+, NDNT, no masses noted, no organomegaly noted MSK: No e/c/c Neuro: A&O x 3, no focal deficits Psyc: Appropriate interaction and affect, calm/cooperative  Data Reviewed: I have personally reviewed following labs and imaging studies.  CBC: Recent Labs  Lab 03/24/20 1140 03/24/20 1853 03/25/20 0404  WBC 9.7 8.2 8.6  HGB 11.5* 11.1* 10.1*  HCT 32.8* 31.3* 27.9*  MCV 90.9 90.5 89.4  PLT 174 164 629   Basic Metabolic Panel: Recent Labs  Lab 03/24/20 1140 03/24/20 1853 03/25/20 0404  NA 138  --  138  K 3.0*  --  2.6*  CL 100  --  102  CO2 24  --  23  GLUCOSE 234*  --  147*  BUN 70*  --  73*  CREATININE 4.78* 4.90* 4.93*  CALCIUM 10.1  --  9.7    GFR: Estimated Creatinine Clearance: 18.4 mL/min (A) (by C-G formula based on SCr of 4.93 mg/dL (H)). Liver Function Tests: Recent Labs  Lab 03/24/20 1140 03/25/20 0404  AST 26 24  ALT 34 28  ALKPHOS 87 74  BILITOT 1.0 0.9  PROT 7.4 6.5  ALBUMIN 3.4* 3.0*   Recent Labs  Lab 03/24/20 1140  LIPASE 39   No results for input(s): AMMONIA in the last 168 hours. Coagulation Profile: No results for input(s): INR, PROTIME in the last 168 hours. Cardiac Enzymes: No results for input(s): CKTOTAL, CKMB, CKMBINDEX, TROPONINI in the last 168 hours. BNP (last 3 results) No results for input(s): PROBNP in the last 8760 hours. HbA1C: Recent Labs    03/25/20 0404  HGBA1C 6.4*   CBG: Recent Labs  Lab 03/24/20 2355 03/25/20 0610 03/25/20 1216  GLUCAP 167* 143* 195*   Lipid Profile: No results for input(s): CHOL, HDL, LDLCALC, TRIG, CHOLHDL, LDLDIRECT in the last 72 hours. Thyroid Function Tests: No results for input(s): TSH, T4TOTAL, FREET4, T3FREE, THYROIDAB in the last 72 hours. Anemia Panel: No results for input(s): VITAMINB12, FOLATE, FERRITIN, TIBC, IRON, RETICCTPCT in the last 72 hours. Sepsis Labs: No results for input(s): PROCALCITON, LATICACIDVEN in the last 168 hours.  Recent Results (from the past 240 hour(s))  SARS Coronavirus 2 by RT PCR (hospital order, performed in [facility] hospital lab) Nasopharyngeal Nasopharyngeal Swab     Status: None   Collection Time: 03/24/20  4:37 PM   Specimen: Nasopharyngeal Swab  Result Value Ref Range Status   SARS Coronavirus 2 NEGATIVE NEGATIVE Final    Comment: (NOTE) SARS-CoV-2 target nucleic acids are NOT DETECTED.  The SARS-CoV-2 RNA is generally detectable in upper and lower respiratory specimens during the acute phase of infection. The lowest concentration of SARS-CoV-2 viral copies this assay can detect is 250 copies / mL. A negative result does not preclude SARS-CoV-2 infection and should not be used as the sole  basis for treatment or other patient management decisions.  A negative result may occur with improper specimen collection / handling, submission of specimen other than nasopharyngeal swab, presence of viral mutation(s) within the areas targeted by this assay, and inadequate number of viral copies (<250 copies / mL). A negative result must be combined with clinical observations, patient history, and epidemiological information.  Fact Sheet for Patients:   StrictlyIdeas.no  Fact Sheet for Healthcare Providers: BankingDealers.co.za  This test is not yet approved or  cleared by the [place] and has been authorized for detection and/or diagnosis of SARS-CoV-2 by FDA under an Emergency Use Authorization (EUA).  This EUA will remain in effect (meaning this test can be used) for the duration of the COVID-19 declaration under Section 564(b)(1) of the Act, 21 U.S.C. section 360bbb-3(b)(1), unless the authorization is terminated or revoked sooner.  Performed at [facility], [place] [address] [place], [place] [address]       Radiology Studies: DG Chest 2 View  Result Date: 03/24/2020 CLINICAL DATA:  Weakness EXAM: CHEST - 2 VIEW COMPARISON:  None. FINDINGS: Cardiac shadows within normal limits. The lungs are well aerated bilaterally. Chronic blunting of the right costophrenic angle is seen and stable. Postsurgical changes in the cervical spine are noted. Degenerative flowing osteophytes are thoracic spine. IMPRESSION: No acute abnormality noted. Electronically Signed   By: Inez Catalina M.D.   On: 03/24/2020 12:07   DG Abd 1 View  Result Date: 03/25/2020 CLINICAL DATA:  Intractable nausea. EXAM: ABDOMEN - 1 VIEW COMPARISON:  X-ray from same day. FINDINGS: A peritoneal dialysis catheter is again noted projecting over the patient's abdomen and pelvis. The bowel gas pattern is nonobstructive. There is a moderate amount of stool in the  colon. There is a linear metallic foreign body projecting over the patient's upper abdomen at the level of the L1 vertebral body. This was not visualized on the patient's recent abdominal radiograph. IMPRESSION: 1. Nonobstructive bowel gas pattern. 2. New metallic linear density projecting over the upper abdomen. This is presumably external to the patient but should be correlated with physical exam. 3. Stable appearance of the peritoneal dialysis catheter. Electronically Signed   By: Constance Holster M.D.   On: 03/25/2020 01:04   DG Abdomen 1 View  Result Date: 03/24/2020 CLINICAL DATA:  Abdominal pain history of peritoneal dialysis EXAM: ABDOMEN - 1 VIEW COMPARISON:  CT 03/22/2013, radiograph 03/22/2020 FINDINGS: Visible lung bases are clear. The bowel gas pattern is nonobstructed. Dialysis catheter with tip coiled in the midline pelvis. No radiopaque calculi. IMPRESSION: Nonobstructed bowel gas pattern. Dialysis catheter tip coiled in the pelvis. Electronically Signed   By: Donavan Foil M.D.   On: 03/24/2020 17:31     Scheduled Meds: . allopurinol  100 mg Oral Daily  . amiodarone  200 mg Oral Daily  . aspirin EC  81 mg Oral Daily  . [START ON 03/26/2020] calcitRIOL  0.5 mcg Oral QODAY  . carvedilol  3.125 mg Oral Q breakfast  . gabapentin  600 mg Oral BID  . gentamicin cream  1 application Topical Daily  . heparin  5,000 Units Subcutaneous Q8H  . hydrALAZINE  100 mg Oral BID  . insulin aspart  0-6 Units Subcutaneous TID WC  . insulin glargine  20 Units Subcutaneous QHS  . isosorbide mononitrate  30 mg Oral Daily  . potassium chloride  40 mEq Oral BID  . zolpidem  10 mg Oral QHS   Continuous Infusions: . dialysis solution 1.5% low-MG/low-CA       LOS: 1 day    Time spent: 25 minutes spent in the coordination of care today.    Jonnie Finner, DO [facility]  If 7PM-7AM, please contact night-coverage [email or web address] 03/25/2020, 2:44 PM

## 2020-03-25 NOTE — Progress Notes (Signed)
Patient arrived from the ED on a stretcher assesment completed see flow sheet , placed on tele ccmd notified, patient oriented to room and staff, bed in lowest position call bell within reach will continue to monitor.

## 2020-03-25 NOTE — Progress Notes (Signed)
[facility] Progress Note   Subjective:   Feels ok this AM. Tolerating liquid diet, requesting advance - done.  Discouraged.   Objective Vitals:   03/25/20 0030 03/25/20 0121 03/25/20 0357 03/25/20 0900  BP: (!) 119/64 (!) 149/83 (!) 100/53   Pulse: 62 70 51 61  Resp: (!) 25 15 14 13   Temp:  98.8 F (37.1 C) 97.7 F (36.5 C) (!) 97.5 F (36.4 C)  TempSrc:  Oral Oral Oral  SpO2: 99% 98% 96% 99%  Weight:  (!) 93.2 kg    Height:  5\' 11"  (1.803 m)     Physical Exam General: no distress, comfortable at 45 deg Heart: no rub, RRR Lungs: clear Abdomen: soft, nontender Extremities: no edema Dialysis Access: PD catheter just right of umbilicus, dressing c/d/i  Additional Objective Labs: Basic Metabolic Panel: Recent Labs  Lab 03/24/20 1140 03/24/20 1853 03/25/20 0404  NA 138  --  138  K 3.0*  --  2.6*  CL 100  --  102  CO2 24  --  23  GLUCOSE 234*  --  147*  BUN 70*  --  73*  CREATININE 4.78* 4.90* 4.93*  CALCIUM 10.1  --  9.7   Liver Function Tests: Recent Labs  Lab 03/24/20 1140 03/25/20 0404  AST 26 24  ALT 34 28  ALKPHOS 87 74  BILITOT 1.0 0.9  PROT 7.4 6.5  ALBUMIN 3.4* 3.0*   Recent Labs  Lab 03/24/20 1140  LIPASE 39   CBC: Recent Labs  Lab 03/24/20 1140 03/24/20 1853 03/25/20 0404  WBC 9.7 8.2 8.6  HGB 11.5* 11.1* 10.1*  HCT 32.8* 31.3* 27.9*  MCV 90.9 90.5 89.4  PLT 174 164 150   Blood Culture    Component Value Date/Time   SDES URINE, RANDOM 08/12/2011 1915   SPECREQUEST Zithromax, Rocephin 08/12/2011 1915   CULT NO GROWTH 08/12/2011 1915   REPTSTATUS 08/13/2011 FINAL 08/12/2011 1915    Cardiac Enzymes: No results for input(s): CKTOTAL, CKMB, CKMBINDEX, TROPONINI in the last 168 hours. CBG: Recent Labs  Lab 03/24/20 2355 03/25/20 0610  GLUCAP 167* 143*   Iron Studies: No results for input(s): IRON, TIBC, TRANSFERRIN, FERRITIN in the last 72 hours. @lablastinr3 @ Studies/Results: DG Chest 2 View  Result  Date: 03/24/2020 CLINICAL DATA:  Weakness EXAM: CHEST - 2 VIEW COMPARISON:  None. FINDINGS: Cardiac shadows within normal limits. The lungs are well aerated bilaterally. Chronic blunting of the right costophrenic angle is seen and stable. Postsurgical changes in the cervical spine are noted. Degenerative flowing osteophytes are thoracic spine. IMPRESSION: No acute abnormality noted. Electronically Signed   By: Inez Catalina M.D.   On: 03/24/2020 12:07   DG Abd 1 View  Result Date: 03/25/2020 CLINICAL DATA:  Intractable nausea. EXAM: ABDOMEN - 1 VIEW COMPARISON:  X-ray from same day. FINDINGS: A peritoneal dialysis catheter is again noted projecting over the patient's abdomen and pelvis. The bowel gas pattern is nonobstructive. There is a moderate amount of stool in the colon. There is a linear metallic foreign body projecting over the patient's upper abdomen at the level of the L1 vertebral body. This was not visualized on the patient's recent abdominal radiograph. IMPRESSION: 1. Nonobstructive bowel gas pattern. 2. New metallic linear density projecting over the upper abdomen. This is presumably external to the patient but should be correlated with physical exam. 3. Stable appearance of the peritoneal dialysis catheter. Electronically Signed   By: Constance Holster M.D.   On: 03/25/2020 01:04  DG Abdomen 1 View  Result Date: 03/24/2020 CLINICAL DATA:  Abdominal pain history of peritoneal dialysis EXAM: ABDOMEN - 1 VIEW COMPARISON:  CT 03/22/2013, radiograph 03/22/2020 FINDINGS: Visible lung bases are clear. The bowel gas pattern is nonobstructed. Dialysis catheter with tip coiled in the midline pelvis. No radiopaque calculi. IMPRESSION: Nonobstructed bowel gas pattern. Dialysis catheter tip coiled in the pelvis. Electronically Signed   By: Donavan Foil M.D.   On: 03/24/2020 17:31   Medications:  . allopurinol  100 mg Oral Daily  . amiodarone  200 mg Oral Daily  . aspirin EC  81 mg Oral Daily  .  [START ON 03/26/2020] calcitRIOL  0.5 mcg Oral QODAY  . carvedilol  3.125 mg Oral Q breakfast  . gabapentin  600 mg Oral BID  . heparin  5,000 Units Subcutaneous Q8H  . hydrALAZINE  100 mg Oral BID  . insulin aspart  0-6 Units Subcutaneous TID WC  . insulin glargine  20 Units Subcutaneous QHS  . isosorbide mononitrate  30 mg Oral Daily  . potassium chloride  40 mEq Oral BID  . zolpidem  10 mg Oral QHS    Assessment 1. ESRD (secondary to diabetic nephropathy) on PD (Clinic: [facility]) 2. N/V, loss of appetite, dizziness, weight loss (part edema, part lean body mass by history). Concern for over ultrafiltration.  KUB ok x 2.  No h/o gastroparesis.  3. Secondary hyperparathyroidism 4. Anemia of chronic kidney disease 5. Combined CHF 6. DM2 7. HTN 8.  Hypokalemia, repleted   Plan 1. Held PD overnight, regular diet today to volume expand 2. PD tonight 4 exchanges, 1.5h cycles, 1.5%.  If tolerates can anticipate discharge to home by Sunday. Patient agreeable to plan. 3. There is a question of whether he will need to temporarily transition to intermittent hemodialysis, will hold on that and adjust his PD rx first 4. KCl 40 x 2 given today 5. Avoid nephrotoxic medications including NSAIDs and iodinated intravenous contrast exposure unless the latter is absolutely indicated.  Preferred narcotic agents for pain control are hydromorphone, fentanyl, and methadone. Morphine should not be used. Avoid Baclofen and avoid oral sodium phosphate and magnesium citrate based laxatives / bowel preps. Continue strict Input and Output monitoring. Will monitor the patient closely with you and intervene or adjust therapy as indicated by changes in clinical status/labs   Jannifer Hick MD 03/25/2020, 11:08 AM  [place] Pager: [phone]

## 2020-03-25 NOTE — Progress Notes (Signed)
CRITICAL VALUE ALERT  Critical Value:  Potasium level 2.6  Date & Time Notied:  03/25/2020@ 0550  Provider Notified: Kennon Holter MD  Orders Received/Actions taken:

## 2020-03-25 NOTE — Progress Notes (Signed)
Blount MD paged again for potassium level of 2.6, no call back yet, patient in a stable condition, no sign of distress noted, will continue to monitor.

## 2020-03-26 DIAGNOSIS — E876 Hypokalemia: Secondary | ICD-10-CM

## 2020-03-26 DIAGNOSIS — D631 Anemia in chronic kidney disease: Secondary | ICD-10-CM | POA: Diagnosis not present

## 2020-03-26 DIAGNOSIS — N2581 Secondary hyperparathyroidism of renal origin: Secondary | ICD-10-CM | POA: Diagnosis not present

## 2020-03-26 DIAGNOSIS — Z992 Dependence on renal dialysis: Secondary | ICD-10-CM | POA: Diagnosis not present

## 2020-03-26 DIAGNOSIS — N186 End stage renal disease: Secondary | ICD-10-CM | POA: Diagnosis not present

## 2020-03-26 LAB — RENAL FUNCTION PANEL
Albumin: 3.4 g/dL — ABNORMAL LOW (ref 3.5–5.0)
Anion gap: 12 (ref 5–15)
BUN: 71 mg/dL — ABNORMAL HIGH (ref 8–23)
CO2: 25 mmol/L (ref 22–32)
Calcium: 9.9 mg/dL (ref 8.9–10.3)
Chloride: 102 mmol/L (ref 98–111)
Creatinine, Ser: 4.52 mg/dL — ABNORMAL HIGH (ref 0.61–1.24)
GFR calc Af Amer: 15 mL/min — ABNORMAL LOW (ref >60)
GFR calc non Af Amer: 13 mL/min — ABNORMAL LOW (ref >60)
Glucose, Bld: 203 mg/dL — ABNORMAL HIGH (ref 70–99)
Phosphorus: 4.3 mg/dL (ref 2.5–4.6)
Potassium: 3.9 mmol/L (ref 3.5–5.1)
Sodium: 139 mmol/L (ref 135–145)

## 2020-03-26 LAB — CBC WITH DIFFERENTIAL/PLATELET
Abs Immature Granulocytes: 0.06 10*3/uL (ref 0.00–0.07)
Basophils Absolute: 0.1 10*3/uL (ref 0.0–0.1)
Basophils Relative: 1 %
Eosinophils Absolute: 0.1 10*3/uL (ref 0.0–0.5)
Eosinophils Relative: 2 %
HCT: 33.8 % — ABNORMAL LOW (ref 39.0–52.0)
Hemoglobin: 12.1 g/dL — ABNORMAL LOW (ref 13.0–17.0)
Immature Granulocytes: 1 %
Lymphocytes Relative: 11 %
Lymphs Abs: 0.8 10*3/uL (ref 0.7–4.0)
MCH: 33.2 pg (ref 26.0–34.0)
MCHC: 35.8 g/dL (ref 30.0–36.0)
MCV: 92.6 fL (ref 80.0–100.0)
Monocytes Absolute: 0.6 10*3/uL (ref 0.1–1.0)
Monocytes Relative: 8 %
Neutro Abs: 5.5 10*3/uL (ref 1.7–7.7)
Neutrophils Relative %: 77 %
Platelets: 137 10*3/uL — ABNORMAL LOW (ref 150–400)
RBC: 3.65 MIL/uL — ABNORMAL LOW (ref 4.22–5.81)
RDW: 14.4 % (ref 11.5–15.5)
WBC: 7.1 10*3/uL (ref 4.0–10.5)
nRBC: 0 % (ref 0.0–0.2)

## 2020-03-26 LAB — MAGNESIUM: Magnesium: 2.4 mg/dL (ref 1.7–2.4)

## 2020-03-26 LAB — GLUCOSE, CAPILLARY
Glucose-Capillary: 173 mg/dL — ABNORMAL HIGH (ref 70–99)
Glucose-Capillary: 212 mg/dL — ABNORMAL HIGH (ref 70–99)

## 2020-03-26 MED ORDER — GABAPENTIN 100 MG PO CAPS
300.0000 mg | ORAL_CAPSULE | Freq: Every day | ORAL | 0 refills | Status: DC
Start: 2020-03-26 — End: 2021-11-29

## 2020-03-26 NOTE — Discharge Summary (Addendum)
Physician Discharge Summary  Curtis Mays XLK:[id number] DOB: 12-Nov-1960 DOA: 03/24/2020  PCP: Helen Hashimoto., MD  Admit date: 03/24/2020 Discharge date: 03/26/2020  Admitted From: Home Disposition:  Discharged to home.   Recommendations for Outpatient Follow-up:  1. Follow up with PCP in 1 week 2. Please obtain BMP/CBC in one week 3. Follow up with Nephrology as scheduled.  Discharge Condition: Stable  CODE STATUS: FULL   Brief/Interim Summary: The patient is a 59 yr old man who presented to the ED due to 3 days of nausea and vomiting. He also states that he has felt a little dizzy with standing. The patient carries a past medical history significant fo CKD IV for which he is on PD. The patient still makes some urine. However, his nausea and vomiting has continued.  7/25: N is resolved. Labs ok. Nephrology has reviewed. [place] for discharge from their standpoint. Will discharge to home.   Discharge Diagnoses:  Orthostatic hypotension     - Likely due to over-diuresis.      - Nephrology has been consulted and has planned to adjust the patient's PD to improve his volume status; PD held at admission, diuretics held at admission.     - improved  ESRD on PD     - Per nephrology: Instructed pt to hold PD tonight again and will have RN reprogram his cycler tomorrow  Hypokalemia     - resolved  CAD/Hx NSTEMI/CHF     - [facility]ocardi[facility] on 11/16/2019 demonstrated a normal EF (55-60%) with normal function, no segmental wall motion abnormalities. There is dilatation of the left ventricle. Continue antianginals, Diuretics held. Monitor on telemetry.     - Imdur, ASA; hold coreg  Nausea     - Abdominal exam is benign.     - KUB negative     - resolved  DM II     - HbA1c is 6.4.     - Continue home regimen at discharge.   Hypertension     - BP improving; imdur, coreg and hydralazine were resumed at admission, hold coreg and hydralazine for now.     - continue to  hold coreg and hydralazine at discharge. Follow up with PCP in 5 - 7 days for BP check and eval for resuming BP meds. These medicines will still be on his med rec, but he has been instructed to hold them until he sees his PCP. He has voiced understanding.    DM Neuropathy      - on 600mg  neurontin TID at home; decreased to 300mg  qday d/t renal fxn.      - continue reduced dose gabapentin at discharge.   Discharge Instructions   Allergies as of 03/26/2020   No Known Allergies     Medication List    STOP taking these medications   gabapentin 600 MG tablet Commonly known as: NEURONTIN Replaced by: gabapentin 100 MG capsule   hydrALAZINE 100 MG tablet Commonly known as: APRESOLINE   HYDROcodone-acetaminophen 5-325 MG tablet Commonly known as: NORCO/VICODIN     TAKE these medications   allopurinol 100 MG tablet Commonly known as: ZYLOPRIM Take 100 mg by mouth daily.   amiodarone 200 MG tablet Commonly known as: PACERONE Take 200 mg by mouth daily.   aspirin 81 MG EC tablet Take 1 tablet (81 mg total) by mouth daily.   atorvastatin 40 MG tablet Commonly known as: LIPITOR Take 1 tablet (40 mg total) by mouth daily at 6 PM.  calcitRIOL 0.5 MCG capsule Commonly known as: ROCALTROL Take 0.5 mcg by mouth every other day.   carvedilol 6.25 MG tablet Commonly known as: COREG Take 3.125 mg by mouth in the morning.   gabapentin 100 MG capsule Commonly known as: Neurontin Take 3 capsules (300 mg total) by mouth daily. Replaces: gabapentin 600 MG tablet   HumaLOG 100 UNIT/ML injection Generic drug: insulin lispro Inject into the skin See admin instructions. Inject into the skin three times a day before meals, per sliding scale: 10 units per 29 carbs   insulin glargine 100 UNIT/ML injection Commonly known as: LANTUS Inject 30-40 Units into the skin at bedtime.   isosorbide mononitrate 30 MG 24 hr tablet Commonly known as: IMDUR Take 30 mg by mouth in the morning. What  changed: Another medication with the same name was removed. Continue taking this medication, and follow the directions you see here.   metolazone 2.5 MG tablet Commonly known as: ZAROXOLYN Take 1 tablet (2.5 mg total) by mouth 2 (two) times a week. What changed:   when to take this  reasons to take this   oxyCODONE 5 MG immediate release tablet Commonly known as: Oxy IR/ROXICODONE Take 5 mg by mouth every 6 (six) hours as needed for severe pain (gout).   tamsulosin 0.4 MG Caps capsule Commonly known as: FLOMAX Take 0.4 mg by mouth at bedtime.   torsemide 20 MG tablet Commonly known as: DEMADEX Take 3 tablets (60 mg total) by mouth 2 (two) times daily.   zolpidem 10 MG tablet Commonly known as: AMBIEN Take 10 mg by mouth at bedtime.       No Known Allergies  Consultations:  Nephrology  Procedures/Studies: DG Chest 2 View  Result Date: 03/24/2020 CLINICAL DATA:  Weakness EXAM: CHEST - 2 VIEW COMPARISON:  None. FINDINGS: Cardiac shadows within normal limits. The lungs are well aerated bilaterally. Chronic blunting of the right costophrenic angle is seen and stable. Postsurgical changes in the cervical spine are noted. Degenerative flowing osteophytes are thoracic spine. IMPRESSION: No acute abnormality noted. Electronically Signed   By: Inez Catalina M.D.   On: 03/24/2020 12:07   DG Abd 1 View  Result Date: 03/25/2020 CLINICAL DATA:  Intractable nausea. EXAM: ABDOMEN - 1 VIEW COMPARISON:  X-ray from same day. FINDINGS: A peritoneal dialysis catheter is again noted projecting over the patient's abdomen and pelvis. The bowel gas pattern is nonobstructive. There is a moderate amount of stool in the colon. There is a linear metallic foreign body projecting over the patient's upper abdomen at the level of the L1 vertebral body. This was not visualized on the patient's recent abdominal radiograph. IMPRESSION: 1. Nonobstructive bowel gas pattern. 2. New metallic linear density  projecting over the upper abdomen. This is presumably external to the patient but should be correlated with physical exam. 3. Stable appearance of the peritoneal dialysis catheter. Electronically Signed   By: Constance Holster M.D.   On: 03/25/2020 01:04   DG Abdomen 1 View  Result Date: 03/24/2020 CLINICAL DATA:  Abdominal pain history of peritoneal dialysis EXAM: ABDOMEN - 1 VIEW COMPARISON:  CT 03/22/2013, radiograph 03/22/2020 FINDINGS: Visible lung bases are clear. The bowel gas pattern is nonobstructed. Dialysis catheter with tip coiled in the midline pelvis. No radiopaque calculi. IMPRESSION: Nonobstructed bowel gas pattern. Dialysis catheter tip coiled in the pelvis. Electronically Signed   By: Donavan Foil M.D.   On: 03/24/2020 17:31   Subjective: "You are the one I was waiting for."  Discharge Exam: Vitals:   03/26/20 0750 03/26/20 0826  BP:  (!) 129/69  Pulse:  58  Resp:  14  Temp: (!) 97.5 F (36.4 C) (!) 96.7 F (35.9 C)  SpO2:  100%   Vitals:   03/26/20 0327 03/26/20 0620 03/26/20 0750 03/26/20 0826  BP: (!) 103/39   (!) 129/69  Pulse: 60   58  Resp: 19   14  Temp: 97.6 F (36.4 C)  (!) 97.5 F (36.4 C) (!) 96.7 F (35.9 C)  TempSrc: Oral  Oral Axillary  SpO2: 98%   100%  Weight:  (!) 94.2 kg (!) 96.8 kg   Height:        General: 59 y.o. male resting in bed in NAD Cardiovascular: RRR, +S1, S2, no m/g/r Respiratory: CTABL, no w/r/r, normal WOB GI: BS+, NDNT, no masses noted, no organomegaly noted MSK: No e/c/c Neuro: A&O x 3, no focal deficits Psyc: Appropriate interaction and affect, calm/cooperative   The results of significant diagnostics from this hospitalization (including imaging, microbiology, ancillary and laboratory) are listed below for reference.     Microbiology: Recent Results (from the past 240 hour(s))  SARS Coronavirus 2 by RT PCR (hospital order, performed in [facility] hospital lab) Nasopharyngeal Nasopharyngeal Swab     Status: None    Collection Time: 03/24/20  4:37 PM   Specimen: Nasopharyngeal Swab  Result Value Ref Range Status   SARS Coronavirus 2 NEGATIVE NEGATIVE Final    Comment: (NOTE) SARS-CoV-2 target nucleic acids are NOT DETECTED.  The SARS-CoV-2 RNA is generally detectable in upper and lower respiratory specimens during the acute phase of infection. The lowest concentration of SARS-CoV-2 viral copies this assay can detect is 250 copies / mL. A negative result does not preclude SARS-CoV-2 infection and should not be used as the sole basis for treatment or other patient management decisions.  A negative result may occur with improper specimen collection / handling, submission of specimen other than nasopharyngeal swab, presence of viral mutation(s) within the areas targeted by this assay, and inadequate number of viral copies (<250 copies / mL). A negative result must be combined with clinical observations, patient history, and epidemiological information.  Fact Sheet for Patients:   StrictlyIdeas.no  Fact Sheet for Healthcare Providers: BankingDealers.co.za  This test is not yet approved or  cleared by the [place] FDA and has been authorized for detection and/or diagnosis of SARS-CoV-2 by FDA under an Emergency Use Authorization (EUA).  This EUA will remain in effect (meaning this test can be used) for the duration of the COVID-19 declaration under Section 564(b)(1) of the Act, 21 U.S.C. section 360bbb-3(b)(1), unless the authorization is terminated or revoked sooner.  Performed at [facility], [place] [address], [place], [place] [address]      Labs: BNP (last 3 results) No results for input(s): BNP in the last 8760 hours. Basic Metabolic Panel: Recent Labs  Lab 03/24/20 1140 03/24/20 1853 03/25/20 0404 03/26/20 0836  NA 138  --  138 139  K 3.0*  --  2.6* 3.9  CL 100  --  102 102  CO2 24  --  23 25  GLUCOSE 234*  --  147*  203*  BUN 70*  --  73* 71*  CREATININE 4.78* 4.90* 4.93* 4.52*  CALCIUM 10.1  --  9.7 9.9  MG  --   --   --  2.4  PHOS  --   --   --  4.3   Liver  Function Tests: Recent Labs  Lab 03/24/20 1140 03/25/20 0404 03/26/20 0836  AST 26 24  --   ALT 34 28  --   ALKPHOS 87 74  --   BILITOT 1.0 0.9  --   PROT 7.4 6.5  --   ALBUMIN 3.4* 3.0* 3.4*   Recent Labs  Lab 03/24/20 1140  LIPASE 39   No results for input(s): AMMONIA in the last 168 hours. CBC: Recent Labs  Lab 03/24/20 1140 03/24/20 1853 03/25/20 0404 03/26/20 0836  WBC 9.7 8.2 8.6 7.1  NEUTROABS  --   --   --  5.5  HGB 11.5* 11.1* 10.1* 12.1*  HCT 32.8* 31.3* 27.9* 33.8*  MCV 90.9 90.5 89.4 92.6  PLT 174 164 150 137*   Cardiac Enzymes: No results for input(s): CKTOTAL, CKMB, CKMBINDEX, TROPONINI in the last 168 hours. BNP: Invalid input(s): POCBNP CBG: Recent Labs  Lab 03/24/20 2355 03/25/20 0610 03/25/20 1216 03/25/20 2128 03/26/20 0610  GLUCAP 167* 143* 195* 206* 173*   D-Dimer No results for input(s): DDIMER in the last 72 hours. Hgb A1c Recent Labs    03/25/20 0404  HGBA1C 6.4*   Lipid Profile No results for input(s): CHOL, HDL, LDLCALC, TRIG, CHOLHDL, LDLDIRECT in the last 72 hours. Thyroid function studies No results for input(s): TSH, T4TOTAL, T3FREE, THYROIDAB in the last 72 hours.  Invalid input(s): FREET3 Anemia work up No results for input(s): VITAMINB12, FOLATE, FERRITIN, TIBC, IRON, RETICCTPCT in the last 72 hours. Urinalysis    Component Value Date/Time   COLORURINE YELLOW 03/24/2020 1200   APPEARANCEUR CLEAR 03/24/2020 1200   LABSPEC 1.011 03/24/2020 1200   PHURINE 5.0 03/24/2020 1200   GLUCOSEU 50 (A) 03/24/2020 1200   HGBUR NEGATIVE 03/24/2020 1200   BILIRUBINUR NEGATIVE 03/24/2020 1200   KETONESUR NEGATIVE 03/24/2020 1200   PROTEINUR 30 (A) 03/24/2020 1200   UROBILINOGEN 1.0 08/11/2011 1931   NITRITE NEGATIVE 03/24/2020 1200   LEUKOCYTESUR NEGATIVE 03/24/2020 1200    Sepsis Labs Invalid input(s): PROCALCITONIN,  WBC,  LACTICIDVEN Microbiology Recent Results (from the past 240 hour(s))  SARS Coronavirus 2 by RT PCR (hospital order, performed in [place] hospital lab) Nasopharyngeal Nasopharyngeal Swab     Status: None   Collection Time: 03/24/20  4:37 PM   Specimen: Nasopharyngeal Swab  Result Value Ref Range Status   SARS Coronavirus 2 NEGATIVE NEGATIVE Final    Comment: (NOTE) SARS-CoV-2 target nucleic acids are NOT DETECTED.  The SARS-CoV-2 RNA is generally detectable in upper and lower respiratory specimens during the acute phase of infection. The lowest concentration of SARS-CoV-2 viral copies this assay can detect is 250 copies / mL. A negative result does not preclude SARS-CoV-2 infection and should not be used as the sole basis for treatment or other patient management decisions.  A negative result may occur with improper specimen collection / handling, submission of specimen other than nasopharyngeal swab, presence of viral mutation(s) within the areas targeted by this assay, and inadequate number of viral copies (<250 copies / mL). A negative result must be combined with clinical observations, patient history, and epidemiological information.  Fact Sheet for Patients:   StrictlyIdeas.no  Fact Sheet for Healthcare Providers: BankingDealers.co.za  This test is not yet approved or  cleared by the [place] FDA and has been authorized for detection and/or diagnosis of SARS-CoV-2 by FDA under an Emergency Use Authorization (EUA).  This EUA will remain in effect (meaning this test can be used) for the duration of the COVID-19 declaration  under Section 564(b)(1) of the Act, 21 U.S.C. section 360bbb-3(b)(1), unless the authorization is terminated or revoked sooner.  Performed at [facility], [address] [place], [place] [address]      Time coordinating discharge: 35  minutes  SIGNED:   Jonnie Finner, DO  [facility] 03/26/2020, 10:58 AM   If 7PM-7AM, please contact night-coverage [email or web address]

## 2020-03-26 NOTE — Progress Notes (Signed)
[facility] Progress Note   Subjective:   PD overnight - no issues.  Feels good this AM.  No nausea.  K improved to 3.9.  No UF with PD - which is fine given his volume status.   Objective Vitals:   03/26/20 0327 03/26/20 0620 03/26/20 0750 03/26/20 0826  BP: (!) 103/39   (!) 129/69  Pulse: 60   58  Resp: 19   14  Temp: 97.6 F (36.4 C)  (!) 97.5 F (36.4 C) (!) 96.7 F (35.9 C)  TempSrc: Oral  Oral Axillary  SpO2: 98%   100%  Weight:  (!) 94.2 kg (!) 96.8 kg   Height:       Physical Exam General: no distress, comfortable at 45 deg Heart: no rub, RRR Lungs: clear Abdomen: soft, nontender Extremities: no edema Dialysis Access: PD catheter just right of umbilicus, dressing c/d/i  Additional Objective Labs: Basic Metabolic Panel: Recent Labs  Lab 03/24/20 1140 03/24/20 1140 03/24/20 1853 03/25/20 0404 03/26/20 0836  NA 138  --   --  138 139  K 3.0*  --   --  2.6* 3.9  CL 100  --   --  102 102  CO2 24  --   --  23 25  GLUCOSE 234*  --   --  147* 203*  BUN 70*  --   --  73* 71*  CREATININE 4.78*   < > 4.90* 4.93* 4.52*  CALCIUM 10.1  --   --  9.7 9.9  PHOS  --   --   --   --  4.3   < > = values in this interval not displayed.   Liver Function Tests: Recent Labs  Lab 03/24/20 1140 03/25/20 0404 03/26/20 0836  AST 26 24  --   ALT 34 28  --   ALKPHOS 87 74  --   BILITOT 1.0 0.9  --   PROT 7.4 6.5  --   ALBUMIN 3.4* 3.0* 3.4*   Recent Labs  Lab 03/24/20 1140  LIPASE 39   CBC: Recent Labs  Lab 03/24/20 1140 03/24/20 1140 03/24/20 1853 03/25/20 0404 03/26/20 0836  WBC 9.7   < > 8.2 8.6 7.1  NEUTROABS  --   --   --   --  5.5  HGB 11.5*   < > 11.1* 10.1* 12.1*  HCT 32.8*   < > 31.3* 27.9* 33.8*  MCV 90.9  --  90.5 89.4 92.6  PLT 174   < > 164 150 137*   < > = values in this interval not displayed.   Blood Culture    Component Value Date/Time   SDES URINE, RANDOM 08/12/2011 1915   SPECREQUEST Zithromax, Rocephin 08/12/2011 1915    CULT NO GROWTH 08/12/2011 1915   REPTSTATUS 08/13/2011 FINAL 08/12/2011 1915    Cardiac Enzymes: No results for input(s): CKTOTAL, CKMB, CKMBINDEX, TROPONINI in the last 168 hours. CBG: Recent Labs  Lab 03/24/20 2355 03/25/20 0610 03/25/20 1216 03/25/20 2128 03/26/20 0610  GLUCAP 167* 143* 195* 206* 173*   Iron Studies: No results for input(s): IRON, TIBC, TRANSFERRIN, FERRITIN in the last 72 hours. @lablastinr3 @ Studies/Results: DG Chest 2 View  Result Date: 03/24/2020 CLINICAL DATA:  Weakness EXAM: CHEST - 2 VIEW COMPARISON:  None. FINDINGS: Cardiac shadows within normal limits. The lungs are well aerated bilaterally. Chronic blunting of the right costophrenic angle is seen and stable. Postsurgical changes in the cervical spine are noted. Degenerative flowing osteophytes are thoracic spine.  IMPRESSION: No acute abnormality noted. Electronically Signed   By: Inez Catalina M.D.   On: 03/24/2020 12:07   DG Abd 1 View  Result Date: 03/25/2020 CLINICAL DATA:  Intractable nausea. EXAM: ABDOMEN - 1 VIEW COMPARISON:  X-ray from same day. FINDINGS: A peritoneal dialysis catheter is again noted projecting over the patient's abdomen and pelvis. The bowel gas pattern is nonobstructive. There is a moderate amount of stool in the colon. There is a linear metallic foreign body projecting over the patient's upper abdomen at the level of the L1 vertebral body. This was not visualized on the patient's recent abdominal radiograph. IMPRESSION: 1. Nonobstructive bowel gas pattern. 2. New metallic linear density projecting over the upper abdomen. This is presumably external to the patient but should be correlated with physical exam. 3. Stable appearance of the peritoneal dialysis catheter. Electronically Signed   By: Constance Holster M.D.   On: 03/25/2020 01:04   DG Abdomen 1 View  Result Date: 03/24/2020 CLINICAL DATA:  Abdominal pain history of peritoneal dialysis EXAM: ABDOMEN - 1 VIEW COMPARISON:   CT 03/22/2013, radiograph 03/22/2020 FINDINGS: Visible lung bases are clear. The bowel gas pattern is nonobstructed. Dialysis catheter with tip coiled in the midline pelvis. No radiopaque calculi. IMPRESSION: Nonobstructed bowel gas pattern. Dialysis catheter tip coiled in the pelvis. Electronically Signed   By: Donavan Foil M.D.   On: 03/24/2020 17:31   Medications: . dialysis solution 1.5% low-MG/low-CA     . allopurinol  100 mg Oral Daily  . amiodarone  200 mg Oral Daily  . aspirin EC  81 mg Oral Daily  . calcitRIOL  0.5 mcg Oral QODAY  . gabapentin  300 mg Oral Daily  . gentamicin cream  1 application Topical Daily  . heparin  5,000 Units Subcutaneous Q8H  . insulin aspart  0-6 Units Subcutaneous TID WC  . insulin glargine  20 Units Subcutaneous QHS  . isosorbide mononitrate  30 mg Oral Daily  . potassium chloride  40 mEq Oral BID  . zolpidem  10 mg Oral QHS    Assessment 1. 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 N/V, loss of appetite, dizziness, weight loss (part edema, part lean body mass by history). Concern for over ultrafiltration.  KUB ok x 2.  No h/o gastroparesis.  3. Secondary hyperparathyroidism 4. Anemia of chronic kidney disease 5. Combined CHF 6. DM2 7. HTN 8.  Hypokalemia, repleted   Plan  1. Did better on less intense PD with 1.5% dialysate.   2. OK to D/C 3. Instructed pt to hold PD tonight again and will have RN reprogram his cycler tomorrow 4. Continue to hold antiHTN meds as he had already been instructed 5. Avoid nephrotoxic medications including NSAIDs and iodinated intravenous contrast exposure unless the latter is absolutely indicated.  Preferred narcotic agents for pain control are hydromorphone, fentanyl, and methadone. Morphine should not be used. Avoid Baclofen and avoid oral sodium phosphate and magnesium citrate based laxatives / bowel preps. Continue strict Input and Output monitoring. Will monitor the patient  closely with you and intervene or adjust therapy as indicated by changes in clinical status/labs   Jannifer Hick MD 03/26/2020, 10:19 AM  [facility] Pager: [phone]

## 2020-03-27 DIAGNOSIS — Z992 Dependence on renal dialysis: Secondary | ICD-10-CM | POA: Diagnosis not present

## 2020-03-27 DIAGNOSIS — N2581 Secondary hyperparathyroidism of renal origin: Secondary | ICD-10-CM | POA: Diagnosis not present

## 2020-03-27 DIAGNOSIS — N186 End stage renal disease: Secondary | ICD-10-CM | POA: Diagnosis not present

## 2020-03-27 DIAGNOSIS — D631 Anemia in chronic kidney disease: Secondary | ICD-10-CM | POA: Diagnosis not present

## 2020-03-28 DIAGNOSIS — Z992 Dependence on renal dialysis: Secondary | ICD-10-CM | POA: Diagnosis not present

## 2020-03-28 DIAGNOSIS — D631 Anemia in chronic kidney disease: Secondary | ICD-10-CM | POA: Diagnosis not present

## 2020-03-28 DIAGNOSIS — N186 End stage renal disease: Secondary | ICD-10-CM | POA: Diagnosis not present

## 2020-03-28 DIAGNOSIS — N2581 Secondary hyperparathyroidism of renal origin: Secondary | ICD-10-CM | POA: Diagnosis not present

## 2020-03-29 DIAGNOSIS — E1140 Type 2 diabetes mellitus with diabetic neuropathy, unspecified: Secondary | ICD-10-CM | POA: Diagnosis not present

## 2020-03-29 DIAGNOSIS — Z9181 History of falling: Secondary | ICD-10-CM | POA: Diagnosis not present

## 2020-03-29 DIAGNOSIS — D631 Anemia in chronic kidney disease: Secondary | ICD-10-CM | POA: Diagnosis not present

## 2020-03-29 DIAGNOSIS — N186 End stage renal disease: Secondary | ICD-10-CM | POA: Diagnosis not present

## 2020-03-29 DIAGNOSIS — N185 Chronic kidney disease, stage 5: Secondary | ICD-10-CM | POA: Diagnosis not present

## 2020-03-29 DIAGNOSIS — Z09 Encounter for follow-up examination after completed treatment for conditions other than malignant neoplasm: Secondary | ICD-10-CM | POA: Diagnosis not present

## 2020-03-29 DIAGNOSIS — Z79899 Other long term (current) drug therapy: Secondary | ICD-10-CM | POA: Diagnosis not present

## 2020-03-29 DIAGNOSIS — Z992 Dependence on renal dialysis: Secondary | ICD-10-CM | POA: Diagnosis not present

## 2020-03-29 DIAGNOSIS — N2581 Secondary hyperparathyroidism of renal origin: Secondary | ICD-10-CM | POA: Diagnosis not present

## 2020-03-30 DIAGNOSIS — Z992 Dependence on renal dialysis: Secondary | ICD-10-CM | POA: Diagnosis not present

## 2020-03-30 DIAGNOSIS — N2581 Secondary hyperparathyroidism of renal origin: Secondary | ICD-10-CM | POA: Diagnosis not present

## 2020-03-30 DIAGNOSIS — D631 Anemia in chronic kidney disease: Secondary | ICD-10-CM | POA: Diagnosis not present

## 2020-03-30 DIAGNOSIS — N186 End stage renal disease: Secondary | ICD-10-CM | POA: Diagnosis not present

## 2020-03-31 DIAGNOSIS — N2581 Secondary hyperparathyroidism of renal origin: Secondary | ICD-10-CM | POA: Diagnosis not present

## 2020-03-31 DIAGNOSIS — Z992 Dependence on renal dialysis: Secondary | ICD-10-CM | POA: Diagnosis not present

## 2020-03-31 DIAGNOSIS — N186 End stage renal disease: Secondary | ICD-10-CM | POA: Diagnosis not present

## 2020-03-31 DIAGNOSIS — D631 Anemia in chronic kidney disease: Secondary | ICD-10-CM | POA: Diagnosis not present

## 2020-04-01 DIAGNOSIS — Z992 Dependence on renal dialysis: Secondary | ICD-10-CM | POA: Diagnosis not present

## 2020-04-01 DIAGNOSIS — D631 Anemia in chronic kidney disease: Secondary | ICD-10-CM | POA: Diagnosis not present

## 2020-04-01 DIAGNOSIS — N186 End stage renal disease: Secondary | ICD-10-CM | POA: Diagnosis not present

## 2020-04-01 DIAGNOSIS — N2581 Secondary hyperparathyroidism of renal origin: Secondary | ICD-10-CM | POA: Diagnosis not present

## 2020-04-02 DIAGNOSIS — Z4932 Encounter for adequacy testing for peritoneal dialysis: Secondary | ICD-10-CM | POA: Diagnosis not present

## 2020-04-02 DIAGNOSIS — R17 Unspecified jaundice: Secondary | ICD-10-CM | POA: Diagnosis not present

## 2020-04-02 DIAGNOSIS — Z992 Dependence on renal dialysis: Secondary | ICD-10-CM | POA: Diagnosis not present

## 2020-04-02 DIAGNOSIS — D509 Iron deficiency anemia, unspecified: Secondary | ICD-10-CM | POA: Diagnosis not present

## 2020-04-02 DIAGNOSIS — K769 Liver disease, unspecified: Secondary | ICD-10-CM | POA: Diagnosis not present

## 2020-04-02 DIAGNOSIS — N186 End stage renal disease: Secondary | ICD-10-CM | POA: Diagnosis not present

## 2020-04-02 DIAGNOSIS — N2581 Secondary hyperparathyroidism of renal origin: Secondary | ICD-10-CM | POA: Diagnosis not present

## 2020-04-02 DIAGNOSIS — D631 Anemia in chronic kidney disease: Secondary | ICD-10-CM | POA: Diagnosis not present

## 2020-04-02 DIAGNOSIS — R82998 Other abnormal findings in urine: Secondary | ICD-10-CM | POA: Diagnosis not present

## 2020-04-03 DIAGNOSIS — K769 Liver disease, unspecified: Secondary | ICD-10-CM | POA: Diagnosis not present

## 2020-04-03 DIAGNOSIS — R82998 Other abnormal findings in urine: Secondary | ICD-10-CM | POA: Diagnosis not present

## 2020-04-03 DIAGNOSIS — Z4932 Encounter for adequacy testing for peritoneal dialysis: Secondary | ICD-10-CM | POA: Diagnosis not present

## 2020-04-03 DIAGNOSIS — R17 Unspecified jaundice: Secondary | ICD-10-CM | POA: Diagnosis not present

## 2020-04-03 DIAGNOSIS — Z992 Dependence on renal dialysis: Secondary | ICD-10-CM | POA: Diagnosis not present

## 2020-04-03 DIAGNOSIS — D631 Anemia in chronic kidney disease: Secondary | ICD-10-CM | POA: Diagnosis not present

## 2020-04-03 DIAGNOSIS — N186 End stage renal disease: Secondary | ICD-10-CM | POA: Diagnosis not present

## 2020-04-03 DIAGNOSIS — D509 Iron deficiency anemia, unspecified: Secondary | ICD-10-CM | POA: Diagnosis not present

## 2020-04-03 DIAGNOSIS — N2581 Secondary hyperparathyroidism of renal origin: Secondary | ICD-10-CM | POA: Diagnosis not present

## 2020-04-04 DIAGNOSIS — R82998 Other abnormal findings in urine: Secondary | ICD-10-CM | POA: Diagnosis not present

## 2020-04-04 DIAGNOSIS — Z4932 Encounter for adequacy testing for peritoneal dialysis: Secondary | ICD-10-CM | POA: Diagnosis not present

## 2020-04-04 DIAGNOSIS — K769 Liver disease, unspecified: Secondary | ICD-10-CM | POA: Diagnosis not present

## 2020-04-04 DIAGNOSIS — R17 Unspecified jaundice: Secondary | ICD-10-CM | POA: Diagnosis not present

## 2020-04-04 DIAGNOSIS — D631 Anemia in chronic kidney disease: Secondary | ICD-10-CM | POA: Diagnosis not present

## 2020-04-04 DIAGNOSIS — D509 Iron deficiency anemia, unspecified: Secondary | ICD-10-CM | POA: Diagnosis not present

## 2020-04-04 DIAGNOSIS — Z992 Dependence on renal dialysis: Secondary | ICD-10-CM | POA: Diagnosis not present

## 2020-04-04 DIAGNOSIS — N186 End stage renal disease: Secondary | ICD-10-CM | POA: Diagnosis not present

## 2020-04-04 DIAGNOSIS — N2581 Secondary hyperparathyroidism of renal origin: Secondary | ICD-10-CM | POA: Diagnosis not present

## 2020-04-05 DIAGNOSIS — D509 Iron deficiency anemia, unspecified: Secondary | ICD-10-CM | POA: Diagnosis not present

## 2020-04-05 DIAGNOSIS — K769 Liver disease, unspecified: Secondary | ICD-10-CM | POA: Diagnosis not present

## 2020-04-05 DIAGNOSIS — R17 Unspecified jaundice: Secondary | ICD-10-CM | POA: Diagnosis not present

## 2020-04-05 DIAGNOSIS — R82998 Other abnormal findings in urine: Secondary | ICD-10-CM | POA: Diagnosis not present

## 2020-04-05 DIAGNOSIS — Z992 Dependence on renal dialysis: Secondary | ICD-10-CM | POA: Diagnosis not present

## 2020-04-05 DIAGNOSIS — Z4932 Encounter for adequacy testing for peritoneal dialysis: Secondary | ICD-10-CM | POA: Diagnosis not present

## 2020-04-05 DIAGNOSIS — D631 Anemia in chronic kidney disease: Secondary | ICD-10-CM | POA: Diagnosis not present

## 2020-04-05 DIAGNOSIS — N186 End stage renal disease: Secondary | ICD-10-CM | POA: Diagnosis not present

## 2020-04-05 DIAGNOSIS — N2581 Secondary hyperparathyroidism of renal origin: Secondary | ICD-10-CM | POA: Diagnosis not present

## 2020-04-06 DIAGNOSIS — Z4932 Encounter for adequacy testing for peritoneal dialysis: Secondary | ICD-10-CM | POA: Diagnosis not present

## 2020-04-06 DIAGNOSIS — D631 Anemia in chronic kidney disease: Secondary | ICD-10-CM | POA: Diagnosis not present

## 2020-04-06 DIAGNOSIS — R17 Unspecified jaundice: Secondary | ICD-10-CM | POA: Diagnosis not present

## 2020-04-06 DIAGNOSIS — Z992 Dependence on renal dialysis: Secondary | ICD-10-CM | POA: Diagnosis not present

## 2020-04-06 DIAGNOSIS — D509 Iron deficiency anemia, unspecified: Secondary | ICD-10-CM | POA: Diagnosis not present

## 2020-04-06 DIAGNOSIS — N186 End stage renal disease: Secondary | ICD-10-CM | POA: Diagnosis not present

## 2020-04-06 DIAGNOSIS — N2581 Secondary hyperparathyroidism of renal origin: Secondary | ICD-10-CM | POA: Diagnosis not present

## 2020-04-06 DIAGNOSIS — K769 Liver disease, unspecified: Secondary | ICD-10-CM | POA: Diagnosis not present

## 2020-04-06 DIAGNOSIS — R82998 Other abnormal findings in urine: Secondary | ICD-10-CM | POA: Diagnosis not present

## 2020-04-07 DIAGNOSIS — R17 Unspecified jaundice: Secondary | ICD-10-CM | POA: Diagnosis not present

## 2020-04-07 DIAGNOSIS — D631 Anemia in chronic kidney disease: Secondary | ICD-10-CM | POA: Diagnosis not present

## 2020-04-07 DIAGNOSIS — R82998 Other abnormal findings in urine: Secondary | ICD-10-CM | POA: Diagnosis not present

## 2020-04-07 DIAGNOSIS — N186 End stage renal disease: Secondary | ICD-10-CM | POA: Diagnosis not present

## 2020-04-07 DIAGNOSIS — Z992 Dependence on renal dialysis: Secondary | ICD-10-CM | POA: Diagnosis not present

## 2020-04-07 DIAGNOSIS — N2581 Secondary hyperparathyroidism of renal origin: Secondary | ICD-10-CM | POA: Diagnosis not present

## 2020-04-07 DIAGNOSIS — Z4932 Encounter for adequacy testing for peritoneal dialysis: Secondary | ICD-10-CM | POA: Diagnosis not present

## 2020-04-07 DIAGNOSIS — D509 Iron deficiency anemia, unspecified: Secondary | ICD-10-CM | POA: Diagnosis not present

## 2020-04-07 DIAGNOSIS — K769 Liver disease, unspecified: Secondary | ICD-10-CM | POA: Diagnosis not present

## 2020-04-08 DIAGNOSIS — Z4932 Encounter for adequacy testing for peritoneal dialysis: Secondary | ICD-10-CM | POA: Diagnosis not present

## 2020-04-08 DIAGNOSIS — R17 Unspecified jaundice: Secondary | ICD-10-CM | POA: Diagnosis not present

## 2020-04-08 DIAGNOSIS — R82998 Other abnormal findings in urine: Secondary | ICD-10-CM | POA: Diagnosis not present

## 2020-04-08 DIAGNOSIS — Z992 Dependence on renal dialysis: Secondary | ICD-10-CM | POA: Diagnosis not present

## 2020-04-08 DIAGNOSIS — K769 Liver disease, unspecified: Secondary | ICD-10-CM | POA: Diagnosis not present

## 2020-04-08 DIAGNOSIS — D509 Iron deficiency anemia, unspecified: Secondary | ICD-10-CM | POA: Diagnosis not present

## 2020-04-08 DIAGNOSIS — N186 End stage renal disease: Secondary | ICD-10-CM | POA: Diagnosis not present

## 2020-04-08 DIAGNOSIS — D631 Anemia in chronic kidney disease: Secondary | ICD-10-CM | POA: Diagnosis not present

## 2020-04-08 DIAGNOSIS — N2581 Secondary hyperparathyroidism of renal origin: Secondary | ICD-10-CM | POA: Diagnosis not present

## 2020-04-09 DIAGNOSIS — Z4932 Encounter for adequacy testing for peritoneal dialysis: Secondary | ICD-10-CM | POA: Diagnosis not present

## 2020-04-09 DIAGNOSIS — R17 Unspecified jaundice: Secondary | ICD-10-CM | POA: Diagnosis not present

## 2020-04-09 DIAGNOSIS — N2581 Secondary hyperparathyroidism of renal origin: Secondary | ICD-10-CM | POA: Diagnosis not present

## 2020-04-09 DIAGNOSIS — N186 End stage renal disease: Secondary | ICD-10-CM | POA: Diagnosis not present

## 2020-04-09 DIAGNOSIS — R82998 Other abnormal findings in urine: Secondary | ICD-10-CM | POA: Diagnosis not present

## 2020-04-09 DIAGNOSIS — D509 Iron deficiency anemia, unspecified: Secondary | ICD-10-CM | POA: Diagnosis not present

## 2020-04-09 DIAGNOSIS — K769 Liver disease, unspecified: Secondary | ICD-10-CM | POA: Diagnosis not present

## 2020-04-09 DIAGNOSIS — Z992 Dependence on renal dialysis: Secondary | ICD-10-CM | POA: Diagnosis not present

## 2020-04-09 DIAGNOSIS — D631 Anemia in chronic kidney disease: Secondary | ICD-10-CM | POA: Diagnosis not present

## 2020-04-10 DIAGNOSIS — Z992 Dependence on renal dialysis: Secondary | ICD-10-CM | POA: Diagnosis not present

## 2020-04-10 DIAGNOSIS — R17 Unspecified jaundice: Secondary | ICD-10-CM | POA: Diagnosis not present

## 2020-04-10 DIAGNOSIS — Z4932 Encounter for adequacy testing for peritoneal dialysis: Secondary | ICD-10-CM | POA: Diagnosis not present

## 2020-04-10 DIAGNOSIS — K769 Liver disease, unspecified: Secondary | ICD-10-CM | POA: Diagnosis not present

## 2020-04-10 DIAGNOSIS — N2581 Secondary hyperparathyroidism of renal origin: Secondary | ICD-10-CM | POA: Diagnosis not present

## 2020-04-10 DIAGNOSIS — E46 Unspecified protein-calorie malnutrition: Secondary | ICD-10-CM | POA: Insufficient documentation

## 2020-04-10 DIAGNOSIS — D509 Iron deficiency anemia, unspecified: Secondary | ICD-10-CM | POA: Diagnosis not present

## 2020-04-10 DIAGNOSIS — N186 End stage renal disease: Secondary | ICD-10-CM | POA: Diagnosis not present

## 2020-04-10 DIAGNOSIS — R82998 Other abnormal findings in urine: Secondary | ICD-10-CM | POA: Diagnosis not present

## 2020-04-10 DIAGNOSIS — D631 Anemia in chronic kidney disease: Secondary | ICD-10-CM | POA: Diagnosis not present

## 2020-04-11 DIAGNOSIS — N186 End stage renal disease: Secondary | ICD-10-CM | POA: Diagnosis not present

## 2020-04-11 DIAGNOSIS — Z992 Dependence on renal dialysis: Secondary | ICD-10-CM | POA: Diagnosis not present

## 2020-04-11 DIAGNOSIS — R17 Unspecified jaundice: Secondary | ICD-10-CM | POA: Diagnosis not present

## 2020-04-11 DIAGNOSIS — Z4932 Encounter for adequacy testing for peritoneal dialysis: Secondary | ICD-10-CM | POA: Diagnosis not present

## 2020-04-11 DIAGNOSIS — D509 Iron deficiency anemia, unspecified: Secondary | ICD-10-CM | POA: Diagnosis not present

## 2020-04-11 DIAGNOSIS — N2581 Secondary hyperparathyroidism of renal origin: Secondary | ICD-10-CM | POA: Diagnosis not present

## 2020-04-11 DIAGNOSIS — R82998 Other abnormal findings in urine: Secondary | ICD-10-CM | POA: Diagnosis not present

## 2020-04-11 DIAGNOSIS — D631 Anemia in chronic kidney disease: Secondary | ICD-10-CM | POA: Diagnosis not present

## 2020-04-11 DIAGNOSIS — K769 Liver disease, unspecified: Secondary | ICD-10-CM | POA: Diagnosis not present

## 2020-04-13 DIAGNOSIS — R82998 Other abnormal findings in urine: Secondary | ICD-10-CM | POA: Diagnosis not present

## 2020-04-13 DIAGNOSIS — R17 Unspecified jaundice: Secondary | ICD-10-CM | POA: Diagnosis not present

## 2020-04-13 DIAGNOSIS — K769 Liver disease, unspecified: Secondary | ICD-10-CM | POA: Diagnosis not present

## 2020-04-13 DIAGNOSIS — D509 Iron deficiency anemia, unspecified: Secondary | ICD-10-CM | POA: Diagnosis not present

## 2020-04-13 DIAGNOSIS — D631 Anemia in chronic kidney disease: Secondary | ICD-10-CM | POA: Diagnosis not present

## 2020-04-13 DIAGNOSIS — N186 End stage renal disease: Secondary | ICD-10-CM | POA: Diagnosis not present

## 2020-04-13 DIAGNOSIS — Z992 Dependence on renal dialysis: Secondary | ICD-10-CM | POA: Diagnosis not present

## 2020-04-13 DIAGNOSIS — N2581 Secondary hyperparathyroidism of renal origin: Secondary | ICD-10-CM | POA: Diagnosis not present

## 2020-04-13 DIAGNOSIS — Z4932 Encounter for adequacy testing for peritoneal dialysis: Secondary | ICD-10-CM | POA: Diagnosis not present

## 2020-04-15 DIAGNOSIS — R82998 Other abnormal findings in urine: Secondary | ICD-10-CM | POA: Diagnosis not present

## 2020-04-15 DIAGNOSIS — Z4932 Encounter for adequacy testing for peritoneal dialysis: Secondary | ICD-10-CM | POA: Diagnosis not present

## 2020-04-15 DIAGNOSIS — K769 Liver disease, unspecified: Secondary | ICD-10-CM | POA: Diagnosis not present

## 2020-04-15 DIAGNOSIS — N186 End stage renal disease: Secondary | ICD-10-CM | POA: Diagnosis not present

## 2020-04-15 DIAGNOSIS — D631 Anemia in chronic kidney disease: Secondary | ICD-10-CM | POA: Diagnosis not present

## 2020-04-15 DIAGNOSIS — N2581 Secondary hyperparathyroidism of renal origin: Secondary | ICD-10-CM | POA: Diagnosis not present

## 2020-04-15 DIAGNOSIS — Z992 Dependence on renal dialysis: Secondary | ICD-10-CM | POA: Diagnosis not present

## 2020-04-15 DIAGNOSIS — D509 Iron deficiency anemia, unspecified: Secondary | ICD-10-CM | POA: Diagnosis not present

## 2020-04-15 DIAGNOSIS — R17 Unspecified jaundice: Secondary | ICD-10-CM | POA: Diagnosis not present

## 2020-04-18 DIAGNOSIS — Z992 Dependence on renal dialysis: Secondary | ICD-10-CM | POA: Diagnosis not present

## 2020-04-18 DIAGNOSIS — N186 End stage renal disease: Secondary | ICD-10-CM | POA: Diagnosis not present

## 2020-04-18 DIAGNOSIS — Z4932 Encounter for adequacy testing for peritoneal dialysis: Secondary | ICD-10-CM | POA: Diagnosis not present

## 2020-04-18 DIAGNOSIS — D509 Iron deficiency anemia, unspecified: Secondary | ICD-10-CM | POA: Diagnosis not present

## 2020-04-18 DIAGNOSIS — N2581 Secondary hyperparathyroidism of renal origin: Secondary | ICD-10-CM | POA: Diagnosis not present

## 2020-04-18 DIAGNOSIS — K769 Liver disease, unspecified: Secondary | ICD-10-CM | POA: Diagnosis not present

## 2020-04-18 DIAGNOSIS — R17 Unspecified jaundice: Secondary | ICD-10-CM | POA: Diagnosis not present

## 2020-04-18 DIAGNOSIS — D631 Anemia in chronic kidney disease: Secondary | ICD-10-CM | POA: Diagnosis not present

## 2020-04-18 DIAGNOSIS — R82998 Other abnormal findings in urine: Secondary | ICD-10-CM | POA: Diagnosis not present

## 2020-04-20 DIAGNOSIS — R17 Unspecified jaundice: Secondary | ICD-10-CM | POA: Diagnosis not present

## 2020-04-20 DIAGNOSIS — K769 Liver disease, unspecified: Secondary | ICD-10-CM | POA: Diagnosis not present

## 2020-04-20 DIAGNOSIS — Z4932 Encounter for adequacy testing for peritoneal dialysis: Secondary | ICD-10-CM | POA: Diagnosis not present

## 2020-04-20 DIAGNOSIS — N2581 Secondary hyperparathyroidism of renal origin: Secondary | ICD-10-CM | POA: Diagnosis not present

## 2020-04-20 DIAGNOSIS — N186 End stage renal disease: Secondary | ICD-10-CM | POA: Diagnosis not present

## 2020-04-20 DIAGNOSIS — D509 Iron deficiency anemia, unspecified: Secondary | ICD-10-CM | POA: Diagnosis not present

## 2020-04-20 DIAGNOSIS — R82998 Other abnormal findings in urine: Secondary | ICD-10-CM | POA: Diagnosis not present

## 2020-04-20 DIAGNOSIS — Z992 Dependence on renal dialysis: Secondary | ICD-10-CM | POA: Diagnosis not present

## 2020-04-20 DIAGNOSIS — D631 Anemia in chronic kidney disease: Secondary | ICD-10-CM | POA: Diagnosis not present

## 2020-04-22 DIAGNOSIS — R82998 Other abnormal findings in urine: Secondary | ICD-10-CM | POA: Diagnosis not present

## 2020-04-22 DIAGNOSIS — N2581 Secondary hyperparathyroidism of renal origin: Secondary | ICD-10-CM | POA: Diagnosis not present

## 2020-04-22 DIAGNOSIS — K769 Liver disease, unspecified: Secondary | ICD-10-CM | POA: Diagnosis not present

## 2020-04-22 DIAGNOSIS — D509 Iron deficiency anemia, unspecified: Secondary | ICD-10-CM | POA: Diagnosis not present

## 2020-04-22 DIAGNOSIS — Z4932 Encounter for adequacy testing for peritoneal dialysis: Secondary | ICD-10-CM | POA: Diagnosis not present

## 2020-04-22 DIAGNOSIS — Z992 Dependence on renal dialysis: Secondary | ICD-10-CM | POA: Diagnosis not present

## 2020-04-22 DIAGNOSIS — D631 Anemia in chronic kidney disease: Secondary | ICD-10-CM | POA: Diagnosis not present

## 2020-04-22 DIAGNOSIS — N186 End stage renal disease: Secondary | ICD-10-CM | POA: Diagnosis not present

## 2020-04-22 DIAGNOSIS — R17 Unspecified jaundice: Secondary | ICD-10-CM | POA: Diagnosis not present

## 2020-04-25 DIAGNOSIS — Z4932 Encounter for adequacy testing for peritoneal dialysis: Secondary | ICD-10-CM | POA: Diagnosis not present

## 2020-04-25 DIAGNOSIS — N186 End stage renal disease: Secondary | ICD-10-CM | POA: Diagnosis not present

## 2020-04-25 DIAGNOSIS — K769 Liver disease, unspecified: Secondary | ICD-10-CM | POA: Diagnosis not present

## 2020-04-25 DIAGNOSIS — D509 Iron deficiency anemia, unspecified: Secondary | ICD-10-CM | POA: Diagnosis not present

## 2020-04-25 DIAGNOSIS — N2581 Secondary hyperparathyroidism of renal origin: Secondary | ICD-10-CM | POA: Diagnosis not present

## 2020-04-25 DIAGNOSIS — R17 Unspecified jaundice: Secondary | ICD-10-CM | POA: Diagnosis not present

## 2020-04-25 DIAGNOSIS — Z992 Dependence on renal dialysis: Secondary | ICD-10-CM | POA: Diagnosis not present

## 2020-04-25 DIAGNOSIS — R82998 Other abnormal findings in urine: Secondary | ICD-10-CM | POA: Diagnosis not present

## 2020-04-25 DIAGNOSIS — D631 Anemia in chronic kidney disease: Secondary | ICD-10-CM | POA: Diagnosis not present

## 2020-04-27 DIAGNOSIS — N2581 Secondary hyperparathyroidism of renal origin: Secondary | ICD-10-CM | POA: Diagnosis not present

## 2020-04-27 DIAGNOSIS — R82998 Other abnormal findings in urine: Secondary | ICD-10-CM | POA: Diagnosis not present

## 2020-04-27 DIAGNOSIS — R17 Unspecified jaundice: Secondary | ICD-10-CM | POA: Diagnosis not present

## 2020-04-27 DIAGNOSIS — K769 Liver disease, unspecified: Secondary | ICD-10-CM | POA: Diagnosis not present

## 2020-04-27 DIAGNOSIS — D509 Iron deficiency anemia, unspecified: Secondary | ICD-10-CM | POA: Diagnosis not present

## 2020-04-27 DIAGNOSIS — Z4932 Encounter for adequacy testing for peritoneal dialysis: Secondary | ICD-10-CM | POA: Diagnosis not present

## 2020-04-27 DIAGNOSIS — Z992 Dependence on renal dialysis: Secondary | ICD-10-CM | POA: Diagnosis not present

## 2020-04-27 DIAGNOSIS — N186 End stage renal disease: Secondary | ICD-10-CM | POA: Diagnosis not present

## 2020-04-27 DIAGNOSIS — D631 Anemia in chronic kidney disease: Secondary | ICD-10-CM | POA: Diagnosis not present

## 2020-04-29 DIAGNOSIS — Z992 Dependence on renal dialysis: Secondary | ICD-10-CM | POA: Diagnosis not present

## 2020-04-29 DIAGNOSIS — R82998 Other abnormal findings in urine: Secondary | ICD-10-CM | POA: Diagnosis not present

## 2020-04-29 DIAGNOSIS — D631 Anemia in chronic kidney disease: Secondary | ICD-10-CM | POA: Diagnosis not present

## 2020-04-29 DIAGNOSIS — N186 End stage renal disease: Secondary | ICD-10-CM | POA: Diagnosis not present

## 2020-04-29 DIAGNOSIS — D509 Iron deficiency anemia, unspecified: Secondary | ICD-10-CM | POA: Diagnosis not present

## 2020-04-29 DIAGNOSIS — Z4932 Encounter for adequacy testing for peritoneal dialysis: Secondary | ICD-10-CM | POA: Diagnosis not present

## 2020-04-29 DIAGNOSIS — K769 Liver disease, unspecified: Secondary | ICD-10-CM | POA: Diagnosis not present

## 2020-04-29 DIAGNOSIS — N2581 Secondary hyperparathyroidism of renal origin: Secondary | ICD-10-CM | POA: Diagnosis not present

## 2020-04-29 DIAGNOSIS — R17 Unspecified jaundice: Secondary | ICD-10-CM | POA: Diagnosis not present

## 2020-05-02 DIAGNOSIS — Z4932 Encounter for adequacy testing for peritoneal dialysis: Secondary | ICD-10-CM | POA: Diagnosis not present

## 2020-05-02 DIAGNOSIS — D509 Iron deficiency anemia, unspecified: Secondary | ICD-10-CM | POA: Diagnosis not present

## 2020-05-02 DIAGNOSIS — R17 Unspecified jaundice: Secondary | ICD-10-CM | POA: Diagnosis not present

## 2020-05-02 DIAGNOSIS — D631 Anemia in chronic kidney disease: Secondary | ICD-10-CM | POA: Diagnosis not present

## 2020-05-02 DIAGNOSIS — Z992 Dependence on renal dialysis: Secondary | ICD-10-CM | POA: Diagnosis not present

## 2020-05-02 DIAGNOSIS — R82998 Other abnormal findings in urine: Secondary | ICD-10-CM | POA: Diagnosis not present

## 2020-05-02 DIAGNOSIS — K769 Liver disease, unspecified: Secondary | ICD-10-CM | POA: Diagnosis not present

## 2020-05-02 DIAGNOSIS — N186 End stage renal disease: Secondary | ICD-10-CM | POA: Diagnosis not present

## 2020-05-02 DIAGNOSIS — N2581 Secondary hyperparathyroidism of renal origin: Secondary | ICD-10-CM | POA: Diagnosis not present

## 2020-05-04 DIAGNOSIS — Z4932 Encounter for adequacy testing for peritoneal dialysis: Secondary | ICD-10-CM | POA: Diagnosis not present

## 2020-05-04 DIAGNOSIS — N2581 Secondary hyperparathyroidism of renal origin: Secondary | ICD-10-CM | POA: Diagnosis not present

## 2020-05-04 DIAGNOSIS — R17 Unspecified jaundice: Secondary | ICD-10-CM | POA: Diagnosis not present

## 2020-05-04 DIAGNOSIS — D631 Anemia in chronic kidney disease: Secondary | ICD-10-CM | POA: Diagnosis not present

## 2020-05-04 DIAGNOSIS — Z992 Dependence on renal dialysis: Secondary | ICD-10-CM | POA: Diagnosis not present

## 2020-05-04 DIAGNOSIS — R82998 Other abnormal findings in urine: Secondary | ICD-10-CM | POA: Diagnosis not present

## 2020-05-04 DIAGNOSIS — N186 End stage renal disease: Secondary | ICD-10-CM | POA: Diagnosis not present

## 2020-05-04 DIAGNOSIS — K769 Liver disease, unspecified: Secondary | ICD-10-CM | POA: Diagnosis not present

## 2020-05-06 DIAGNOSIS — R82998 Other abnormal findings in urine: Secondary | ICD-10-CM | POA: Diagnosis not present

## 2020-05-06 DIAGNOSIS — R17 Unspecified jaundice: Secondary | ICD-10-CM | POA: Diagnosis not present

## 2020-05-06 DIAGNOSIS — D631 Anemia in chronic kidney disease: Secondary | ICD-10-CM | POA: Diagnosis not present

## 2020-05-06 DIAGNOSIS — N186 End stage renal disease: Secondary | ICD-10-CM | POA: Diagnosis not present

## 2020-05-06 DIAGNOSIS — N2581 Secondary hyperparathyroidism of renal origin: Secondary | ICD-10-CM | POA: Diagnosis not present

## 2020-05-06 DIAGNOSIS — K769 Liver disease, unspecified: Secondary | ICD-10-CM | POA: Diagnosis not present

## 2020-05-06 DIAGNOSIS — Z4932 Encounter for adequacy testing for peritoneal dialysis: Secondary | ICD-10-CM | POA: Diagnosis not present

## 2020-05-06 DIAGNOSIS — Z992 Dependence on renal dialysis: Secondary | ICD-10-CM | POA: Diagnosis not present

## 2020-05-09 DIAGNOSIS — K769 Liver disease, unspecified: Secondary | ICD-10-CM | POA: Diagnosis not present

## 2020-05-09 DIAGNOSIS — R17 Unspecified jaundice: Secondary | ICD-10-CM | POA: Diagnosis not present

## 2020-05-09 DIAGNOSIS — N186 End stage renal disease: Secondary | ICD-10-CM | POA: Diagnosis not present

## 2020-05-09 DIAGNOSIS — N2581 Secondary hyperparathyroidism of renal origin: Secondary | ICD-10-CM | POA: Diagnosis not present

## 2020-05-09 DIAGNOSIS — R82998 Other abnormal findings in urine: Secondary | ICD-10-CM | POA: Diagnosis not present

## 2020-05-09 DIAGNOSIS — D631 Anemia in chronic kidney disease: Secondary | ICD-10-CM | POA: Diagnosis not present

## 2020-05-09 DIAGNOSIS — Z4932 Encounter for adequacy testing for peritoneal dialysis: Secondary | ICD-10-CM | POA: Diagnosis not present

## 2020-05-09 DIAGNOSIS — Z992 Dependence on renal dialysis: Secondary | ICD-10-CM | POA: Diagnosis not present

## 2020-05-11 DIAGNOSIS — R82998 Other abnormal findings in urine: Secondary | ICD-10-CM | POA: Diagnosis not present

## 2020-05-11 DIAGNOSIS — N2581 Secondary hyperparathyroidism of renal origin: Secondary | ICD-10-CM | POA: Diagnosis not present

## 2020-05-11 DIAGNOSIS — K769 Liver disease, unspecified: Secondary | ICD-10-CM | POA: Diagnosis not present

## 2020-05-11 DIAGNOSIS — N186 End stage renal disease: Secondary | ICD-10-CM | POA: Diagnosis not present

## 2020-05-11 DIAGNOSIS — Z992 Dependence on renal dialysis: Secondary | ICD-10-CM | POA: Diagnosis not present

## 2020-05-11 DIAGNOSIS — Z4932 Encounter for adequacy testing for peritoneal dialysis: Secondary | ICD-10-CM | POA: Diagnosis not present

## 2020-05-11 DIAGNOSIS — D631 Anemia in chronic kidney disease: Secondary | ICD-10-CM | POA: Diagnosis not present

## 2020-05-11 DIAGNOSIS — R17 Unspecified jaundice: Secondary | ICD-10-CM | POA: Diagnosis not present

## 2020-05-13 DIAGNOSIS — K769 Liver disease, unspecified: Secondary | ICD-10-CM | POA: Diagnosis not present

## 2020-05-13 DIAGNOSIS — Z4932 Encounter for adequacy testing for peritoneal dialysis: Secondary | ICD-10-CM | POA: Diagnosis not present

## 2020-05-13 DIAGNOSIS — R82998 Other abnormal findings in urine: Secondary | ICD-10-CM | POA: Diagnosis not present

## 2020-05-13 DIAGNOSIS — R17 Unspecified jaundice: Secondary | ICD-10-CM | POA: Diagnosis not present

## 2020-05-13 DIAGNOSIS — N186 End stage renal disease: Secondary | ICD-10-CM | POA: Diagnosis not present

## 2020-05-13 DIAGNOSIS — D631 Anemia in chronic kidney disease: Secondary | ICD-10-CM | POA: Diagnosis not present

## 2020-05-13 DIAGNOSIS — N2581 Secondary hyperparathyroidism of renal origin: Secondary | ICD-10-CM | POA: Diagnosis not present

## 2020-05-13 DIAGNOSIS — Z992 Dependence on renal dialysis: Secondary | ICD-10-CM | POA: Diagnosis not present

## 2020-05-16 DIAGNOSIS — Z992 Dependence on renal dialysis: Secondary | ICD-10-CM | POA: Diagnosis not present

## 2020-05-16 DIAGNOSIS — N186 End stage renal disease: Secondary | ICD-10-CM | POA: Diagnosis not present

## 2020-05-16 DIAGNOSIS — R82998 Other abnormal findings in urine: Secondary | ICD-10-CM | POA: Diagnosis not present

## 2020-05-16 DIAGNOSIS — Z4932 Encounter for adequacy testing for peritoneal dialysis: Secondary | ICD-10-CM | POA: Diagnosis not present

## 2020-05-16 DIAGNOSIS — N2581 Secondary hyperparathyroidism of renal origin: Secondary | ICD-10-CM | POA: Diagnosis not present

## 2020-05-16 DIAGNOSIS — D631 Anemia in chronic kidney disease: Secondary | ICD-10-CM | POA: Diagnosis not present

## 2020-05-16 DIAGNOSIS — R17 Unspecified jaundice: Secondary | ICD-10-CM | POA: Diagnosis not present

## 2020-05-16 DIAGNOSIS — K769 Liver disease, unspecified: Secondary | ICD-10-CM | POA: Diagnosis not present

## 2020-05-17 DIAGNOSIS — D631 Anemia in chronic kidney disease: Secondary | ICD-10-CM | POA: Diagnosis not present

## 2020-05-17 DIAGNOSIS — K769 Liver disease, unspecified: Secondary | ICD-10-CM | POA: Diagnosis not present

## 2020-05-17 DIAGNOSIS — Z992 Dependence on renal dialysis: Secondary | ICD-10-CM | POA: Diagnosis not present

## 2020-05-17 DIAGNOSIS — N186 End stage renal disease: Secondary | ICD-10-CM | POA: Diagnosis not present

## 2020-05-17 DIAGNOSIS — R82998 Other abnormal findings in urine: Secondary | ICD-10-CM | POA: Diagnosis not present

## 2020-05-17 DIAGNOSIS — R17 Unspecified jaundice: Secondary | ICD-10-CM | POA: Diagnosis not present

## 2020-05-17 DIAGNOSIS — N2581 Secondary hyperparathyroidism of renal origin: Secondary | ICD-10-CM | POA: Diagnosis not present

## 2020-05-17 DIAGNOSIS — Z4932 Encounter for adequacy testing for peritoneal dialysis: Secondary | ICD-10-CM | POA: Diagnosis not present

## 2020-05-18 DIAGNOSIS — N186 End stage renal disease: Secondary | ICD-10-CM | POA: Diagnosis not present

## 2020-05-18 DIAGNOSIS — K769 Liver disease, unspecified: Secondary | ICD-10-CM | POA: Diagnosis not present

## 2020-05-18 DIAGNOSIS — D631 Anemia in chronic kidney disease: Secondary | ICD-10-CM | POA: Diagnosis not present

## 2020-05-18 DIAGNOSIS — Z992 Dependence on renal dialysis: Secondary | ICD-10-CM | POA: Diagnosis not present

## 2020-05-18 DIAGNOSIS — R17 Unspecified jaundice: Secondary | ICD-10-CM | POA: Diagnosis not present

## 2020-05-18 DIAGNOSIS — N2581 Secondary hyperparathyroidism of renal origin: Secondary | ICD-10-CM | POA: Diagnosis not present

## 2020-05-18 DIAGNOSIS — R82998 Other abnormal findings in urine: Secondary | ICD-10-CM | POA: Diagnosis not present

## 2020-05-18 DIAGNOSIS — Z4932 Encounter for adequacy testing for peritoneal dialysis: Secondary | ICD-10-CM | POA: Diagnosis not present

## 2020-05-20 DIAGNOSIS — R82998 Other abnormal findings in urine: Secondary | ICD-10-CM | POA: Diagnosis not present

## 2020-05-20 DIAGNOSIS — R17 Unspecified jaundice: Secondary | ICD-10-CM | POA: Diagnosis not present

## 2020-05-20 DIAGNOSIS — D631 Anemia in chronic kidney disease: Secondary | ICD-10-CM | POA: Diagnosis not present

## 2020-05-20 DIAGNOSIS — Z4932 Encounter for adequacy testing for peritoneal dialysis: Secondary | ICD-10-CM | POA: Diagnosis not present

## 2020-05-20 DIAGNOSIS — Z992 Dependence on renal dialysis: Secondary | ICD-10-CM | POA: Diagnosis not present

## 2020-05-20 DIAGNOSIS — K769 Liver disease, unspecified: Secondary | ICD-10-CM | POA: Diagnosis not present

## 2020-05-20 DIAGNOSIS — N186 End stage renal disease: Secondary | ICD-10-CM | POA: Diagnosis not present

## 2020-05-20 DIAGNOSIS — N2581 Secondary hyperparathyroidism of renal origin: Secondary | ICD-10-CM | POA: Diagnosis not present

## 2020-05-23 DIAGNOSIS — R82998 Other abnormal findings in urine: Secondary | ICD-10-CM | POA: Diagnosis not present

## 2020-05-23 DIAGNOSIS — D631 Anemia in chronic kidney disease: Secondary | ICD-10-CM | POA: Diagnosis not present

## 2020-05-23 DIAGNOSIS — Z992 Dependence on renal dialysis: Secondary | ICD-10-CM | POA: Diagnosis not present

## 2020-05-23 DIAGNOSIS — N186 End stage renal disease: Secondary | ICD-10-CM | POA: Diagnosis not present

## 2020-05-23 DIAGNOSIS — K769 Liver disease, unspecified: Secondary | ICD-10-CM | POA: Diagnosis not present

## 2020-05-23 DIAGNOSIS — N2581 Secondary hyperparathyroidism of renal origin: Secondary | ICD-10-CM | POA: Diagnosis not present

## 2020-05-23 DIAGNOSIS — R17 Unspecified jaundice: Secondary | ICD-10-CM | POA: Diagnosis not present

## 2020-05-23 DIAGNOSIS — Z4932 Encounter for adequacy testing for peritoneal dialysis: Secondary | ICD-10-CM | POA: Diagnosis not present

## 2020-05-25 DIAGNOSIS — N2581 Secondary hyperparathyroidism of renal origin: Secondary | ICD-10-CM | POA: Diagnosis not present

## 2020-05-25 DIAGNOSIS — K769 Liver disease, unspecified: Secondary | ICD-10-CM | POA: Diagnosis not present

## 2020-05-25 DIAGNOSIS — Z4932 Encounter for adequacy testing for peritoneal dialysis: Secondary | ICD-10-CM | POA: Diagnosis not present

## 2020-05-25 DIAGNOSIS — R82998 Other abnormal findings in urine: Secondary | ICD-10-CM | POA: Diagnosis not present

## 2020-05-25 DIAGNOSIS — R17 Unspecified jaundice: Secondary | ICD-10-CM | POA: Diagnosis not present

## 2020-05-25 DIAGNOSIS — Z992 Dependence on renal dialysis: Secondary | ICD-10-CM | POA: Diagnosis not present

## 2020-05-25 DIAGNOSIS — D631 Anemia in chronic kidney disease: Secondary | ICD-10-CM | POA: Diagnosis not present

## 2020-05-25 DIAGNOSIS — N186 End stage renal disease: Secondary | ICD-10-CM | POA: Diagnosis not present

## 2020-05-27 DIAGNOSIS — Z992 Dependence on renal dialysis: Secondary | ICD-10-CM | POA: Diagnosis not present

## 2020-05-27 DIAGNOSIS — R17 Unspecified jaundice: Secondary | ICD-10-CM | POA: Diagnosis not present

## 2020-05-27 DIAGNOSIS — K769 Liver disease, unspecified: Secondary | ICD-10-CM | POA: Diagnosis not present

## 2020-05-27 DIAGNOSIS — D631 Anemia in chronic kidney disease: Secondary | ICD-10-CM | POA: Diagnosis not present

## 2020-05-27 DIAGNOSIS — N2581 Secondary hyperparathyroidism of renal origin: Secondary | ICD-10-CM | POA: Diagnosis not present

## 2020-05-27 DIAGNOSIS — N186 End stage renal disease: Secondary | ICD-10-CM | POA: Diagnosis not present

## 2020-05-27 DIAGNOSIS — Z4932 Encounter for adequacy testing for peritoneal dialysis: Secondary | ICD-10-CM | POA: Diagnosis not present

## 2020-05-27 DIAGNOSIS — R82998 Other abnormal findings in urine: Secondary | ICD-10-CM | POA: Diagnosis not present

## 2020-05-30 DIAGNOSIS — D631 Anemia in chronic kidney disease: Secondary | ICD-10-CM | POA: Diagnosis not present

## 2020-05-30 DIAGNOSIS — R82998 Other abnormal findings in urine: Secondary | ICD-10-CM | POA: Diagnosis not present

## 2020-05-30 DIAGNOSIS — Z992 Dependence on renal dialysis: Secondary | ICD-10-CM | POA: Diagnosis not present

## 2020-05-30 DIAGNOSIS — Z4932 Encounter for adequacy testing for peritoneal dialysis: Secondary | ICD-10-CM | POA: Diagnosis not present

## 2020-05-30 DIAGNOSIS — R17 Unspecified jaundice: Secondary | ICD-10-CM | POA: Diagnosis not present

## 2020-05-30 DIAGNOSIS — N186 End stage renal disease: Secondary | ICD-10-CM | POA: Diagnosis not present

## 2020-05-30 DIAGNOSIS — N2581 Secondary hyperparathyroidism of renal origin: Secondary | ICD-10-CM | POA: Diagnosis not present

## 2020-05-30 DIAGNOSIS — K769 Liver disease, unspecified: Secondary | ICD-10-CM | POA: Diagnosis not present

## 2020-06-01 DIAGNOSIS — N186 End stage renal disease: Secondary | ICD-10-CM | POA: Diagnosis not present

## 2020-06-01 DIAGNOSIS — K769 Liver disease, unspecified: Secondary | ICD-10-CM | POA: Diagnosis not present

## 2020-06-01 DIAGNOSIS — R82998 Other abnormal findings in urine: Secondary | ICD-10-CM | POA: Diagnosis not present

## 2020-06-01 DIAGNOSIS — Z4932 Encounter for adequacy testing for peritoneal dialysis: Secondary | ICD-10-CM | POA: Diagnosis not present

## 2020-06-01 DIAGNOSIS — R17 Unspecified jaundice: Secondary | ICD-10-CM | POA: Diagnosis not present

## 2020-06-01 DIAGNOSIS — D631 Anemia in chronic kidney disease: Secondary | ICD-10-CM | POA: Diagnosis not present

## 2020-06-01 DIAGNOSIS — N2581 Secondary hyperparathyroidism of renal origin: Secondary | ICD-10-CM | POA: Diagnosis not present

## 2020-06-01 DIAGNOSIS — Z992 Dependence on renal dialysis: Secondary | ICD-10-CM | POA: Diagnosis not present

## 2020-06-03 DIAGNOSIS — Z992 Dependence on renal dialysis: Secondary | ICD-10-CM | POA: Diagnosis not present

## 2020-06-03 DIAGNOSIS — N186 End stage renal disease: Secondary | ICD-10-CM | POA: Diagnosis not present

## 2020-06-03 DIAGNOSIS — Z4932 Encounter for adequacy testing for peritoneal dialysis: Secondary | ICD-10-CM | POA: Diagnosis not present

## 2020-06-03 DIAGNOSIS — N2581 Secondary hyperparathyroidism of renal origin: Secondary | ICD-10-CM | POA: Diagnosis not present

## 2020-06-03 DIAGNOSIS — K769 Liver disease, unspecified: Secondary | ICD-10-CM | POA: Diagnosis not present

## 2020-06-03 DIAGNOSIS — R17 Unspecified jaundice: Secondary | ICD-10-CM | POA: Diagnosis not present

## 2020-06-03 DIAGNOSIS — D631 Anemia in chronic kidney disease: Secondary | ICD-10-CM | POA: Diagnosis not present

## 2020-06-03 DIAGNOSIS — D509 Iron deficiency anemia, unspecified: Secondary | ICD-10-CM | POA: Diagnosis not present

## 2020-06-03 DIAGNOSIS — E1122 Type 2 diabetes mellitus with diabetic chronic kidney disease: Secondary | ICD-10-CM | POA: Diagnosis not present

## 2020-06-03 DIAGNOSIS — R82998 Other abnormal findings in urine: Secondary | ICD-10-CM | POA: Diagnosis not present

## 2020-06-06 DIAGNOSIS — N186 End stage renal disease: Secondary | ICD-10-CM | POA: Diagnosis not present

## 2020-06-06 DIAGNOSIS — D631 Anemia in chronic kidney disease: Secondary | ICD-10-CM | POA: Diagnosis not present

## 2020-06-06 DIAGNOSIS — Z4932 Encounter for adequacy testing for peritoneal dialysis: Secondary | ICD-10-CM | POA: Diagnosis not present

## 2020-06-06 DIAGNOSIS — N2581 Secondary hyperparathyroidism of renal origin: Secondary | ICD-10-CM | POA: Diagnosis not present

## 2020-06-06 DIAGNOSIS — K769 Liver disease, unspecified: Secondary | ICD-10-CM | POA: Diagnosis not present

## 2020-06-06 DIAGNOSIS — Z992 Dependence on renal dialysis: Secondary | ICD-10-CM | POA: Diagnosis not present

## 2020-06-06 DIAGNOSIS — E1122 Type 2 diabetes mellitus with diabetic chronic kidney disease: Secondary | ICD-10-CM | POA: Diagnosis not present

## 2020-06-06 DIAGNOSIS — R82998 Other abnormal findings in urine: Secondary | ICD-10-CM | POA: Diagnosis not present

## 2020-06-06 DIAGNOSIS — D509 Iron deficiency anemia, unspecified: Secondary | ICD-10-CM | POA: Diagnosis not present

## 2020-06-06 DIAGNOSIS — R17 Unspecified jaundice: Secondary | ICD-10-CM | POA: Diagnosis not present

## 2020-06-08 DIAGNOSIS — N2581 Secondary hyperparathyroidism of renal origin: Secondary | ICD-10-CM | POA: Diagnosis not present

## 2020-06-08 DIAGNOSIS — E1122 Type 2 diabetes mellitus with diabetic chronic kidney disease: Secondary | ICD-10-CM | POA: Diagnosis not present

## 2020-06-08 DIAGNOSIS — R17 Unspecified jaundice: Secondary | ICD-10-CM | POA: Diagnosis not present

## 2020-06-08 DIAGNOSIS — Z992 Dependence on renal dialysis: Secondary | ICD-10-CM | POA: Diagnosis not present

## 2020-06-08 DIAGNOSIS — Z4932 Encounter for adequacy testing for peritoneal dialysis: Secondary | ICD-10-CM | POA: Diagnosis not present

## 2020-06-08 DIAGNOSIS — R82998 Other abnormal findings in urine: Secondary | ICD-10-CM | POA: Diagnosis not present

## 2020-06-08 DIAGNOSIS — N186 End stage renal disease: Secondary | ICD-10-CM | POA: Diagnosis not present

## 2020-06-08 DIAGNOSIS — K769 Liver disease, unspecified: Secondary | ICD-10-CM | POA: Diagnosis not present

## 2020-06-08 DIAGNOSIS — D509 Iron deficiency anemia, unspecified: Secondary | ICD-10-CM | POA: Diagnosis not present

## 2020-06-08 DIAGNOSIS — D631 Anemia in chronic kidney disease: Secondary | ICD-10-CM | POA: Diagnosis not present

## 2020-06-09 DIAGNOSIS — Z992 Dependence on renal dialysis: Secondary | ICD-10-CM | POA: Diagnosis not present

## 2020-06-09 DIAGNOSIS — Z8572 Personal history of non-Hodgkin lymphomas: Secondary | ICD-10-CM | POA: Diagnosis not present

## 2020-06-09 DIAGNOSIS — C884 Extranodal marginal zone B-cell lymphoma of mucosa-associated lymphoid tissue [MALT-lymphoma]: Secondary | ICD-10-CM | POA: Diagnosis not present

## 2020-06-09 DIAGNOSIS — I2510 Atherosclerotic heart disease of native coronary artery without angina pectoris: Secondary | ICD-10-CM | POA: Diagnosis not present

## 2020-06-09 DIAGNOSIS — Z7682 Awaiting organ transplant status: Secondary | ICD-10-CM | POA: Diagnosis not present

## 2020-06-09 DIAGNOSIS — Z981 Arthrodesis status: Secondary | ICD-10-CM | POA: Diagnosis not present

## 2020-06-09 DIAGNOSIS — E1121 Type 2 diabetes mellitus with diabetic nephropathy: Secondary | ICD-10-CM | POA: Diagnosis not present

## 2020-06-09 DIAGNOSIS — N186 End stage renal disease: Secondary | ICD-10-CM | POA: Diagnosis not present

## 2020-06-09 DIAGNOSIS — Z8679 Personal history of other diseases of the circulatory system: Secondary | ICD-10-CM | POA: Diagnosis not present

## 2020-06-09 DIAGNOSIS — Z87891 Personal history of nicotine dependence: Secondary | ICD-10-CM | POA: Diagnosis not present

## 2020-06-09 DIAGNOSIS — E1122 Type 2 diabetes mellitus with diabetic chronic kidney disease: Secondary | ICD-10-CM | POA: Diagnosis not present

## 2020-06-09 DIAGNOSIS — E7800 Pure hypercholesterolemia, unspecified: Secondary | ICD-10-CM | POA: Diagnosis not present

## 2020-06-09 DIAGNOSIS — Z794 Long term (current) use of insulin: Secondary | ICD-10-CM | POA: Diagnosis not present

## 2020-06-09 DIAGNOSIS — Z1159 Encounter for screening for other viral diseases: Secondary | ICD-10-CM | POA: Diagnosis not present

## 2020-06-09 DIAGNOSIS — I214 Non-ST elevation (NSTEMI) myocardial infarction: Secondary | ICD-10-CM | POA: Diagnosis not present

## 2020-06-09 DIAGNOSIS — E1169 Type 2 diabetes mellitus with other specified complication: Secondary | ICD-10-CM | POA: Diagnosis not present

## 2020-06-09 DIAGNOSIS — R918 Other nonspecific abnormal finding of lung field: Secondary | ICD-10-CM | POA: Diagnosis not present

## 2020-06-09 DIAGNOSIS — I10 Essential (primary) hypertension: Secondary | ICD-10-CM | POA: Diagnosis not present

## 2020-06-10 DIAGNOSIS — R82998 Other abnormal findings in urine: Secondary | ICD-10-CM | POA: Diagnosis not present

## 2020-06-10 DIAGNOSIS — R17 Unspecified jaundice: Secondary | ICD-10-CM | POA: Diagnosis not present

## 2020-06-10 DIAGNOSIS — E1122 Type 2 diabetes mellitus with diabetic chronic kidney disease: Secondary | ICD-10-CM | POA: Diagnosis not present

## 2020-06-10 DIAGNOSIS — N186 End stage renal disease: Secondary | ICD-10-CM | POA: Diagnosis not present

## 2020-06-10 DIAGNOSIS — N2581 Secondary hyperparathyroidism of renal origin: Secondary | ICD-10-CM | POA: Diagnosis not present

## 2020-06-10 DIAGNOSIS — Z992 Dependence on renal dialysis: Secondary | ICD-10-CM | POA: Diagnosis not present

## 2020-06-10 DIAGNOSIS — D509 Iron deficiency anemia, unspecified: Secondary | ICD-10-CM | POA: Diagnosis not present

## 2020-06-10 DIAGNOSIS — Z4932 Encounter for adequacy testing for peritoneal dialysis: Secondary | ICD-10-CM | POA: Diagnosis not present

## 2020-06-10 DIAGNOSIS — D631 Anemia in chronic kidney disease: Secondary | ICD-10-CM | POA: Diagnosis not present

## 2020-06-10 DIAGNOSIS — K769 Liver disease, unspecified: Secondary | ICD-10-CM | POA: Diagnosis not present

## 2020-06-13 DIAGNOSIS — R17 Unspecified jaundice: Secondary | ICD-10-CM | POA: Diagnosis not present

## 2020-06-13 DIAGNOSIS — R82998 Other abnormal findings in urine: Secondary | ICD-10-CM | POA: Diagnosis not present

## 2020-06-13 DIAGNOSIS — Z992 Dependence on renal dialysis: Secondary | ICD-10-CM | POA: Diagnosis not present

## 2020-06-13 DIAGNOSIS — D509 Iron deficiency anemia, unspecified: Secondary | ICD-10-CM | POA: Diagnosis not present

## 2020-06-13 DIAGNOSIS — N2581 Secondary hyperparathyroidism of renal origin: Secondary | ICD-10-CM | POA: Diagnosis not present

## 2020-06-13 DIAGNOSIS — D631 Anemia in chronic kidney disease: Secondary | ICD-10-CM | POA: Diagnosis not present

## 2020-06-13 DIAGNOSIS — Z4932 Encounter for adequacy testing for peritoneal dialysis: Secondary | ICD-10-CM | POA: Diagnosis not present

## 2020-06-13 DIAGNOSIS — N186 End stage renal disease: Secondary | ICD-10-CM | POA: Diagnosis not present

## 2020-06-13 DIAGNOSIS — K769 Liver disease, unspecified: Secondary | ICD-10-CM | POA: Diagnosis not present

## 2020-06-13 DIAGNOSIS — E1122 Type 2 diabetes mellitus with diabetic chronic kidney disease: Secondary | ICD-10-CM | POA: Diagnosis not present

## 2020-06-14 DIAGNOSIS — Z4932 Encounter for adequacy testing for peritoneal dialysis: Secondary | ICD-10-CM | POA: Diagnosis not present

## 2020-06-14 DIAGNOSIS — N2581 Secondary hyperparathyroidism of renal origin: Secondary | ICD-10-CM | POA: Diagnosis not present

## 2020-06-14 DIAGNOSIS — R82998 Other abnormal findings in urine: Secondary | ICD-10-CM | POA: Diagnosis not present

## 2020-06-14 DIAGNOSIS — K769 Liver disease, unspecified: Secondary | ICD-10-CM | POA: Diagnosis not present

## 2020-06-14 DIAGNOSIS — D631 Anemia in chronic kidney disease: Secondary | ICD-10-CM | POA: Diagnosis not present

## 2020-06-14 DIAGNOSIS — R17 Unspecified jaundice: Secondary | ICD-10-CM | POA: Diagnosis not present

## 2020-06-14 DIAGNOSIS — N186 End stage renal disease: Secondary | ICD-10-CM | POA: Diagnosis not present

## 2020-06-14 DIAGNOSIS — E1122 Type 2 diabetes mellitus with diabetic chronic kidney disease: Secondary | ICD-10-CM | POA: Diagnosis not present

## 2020-06-14 DIAGNOSIS — Z992 Dependence on renal dialysis: Secondary | ICD-10-CM | POA: Diagnosis not present

## 2020-06-14 DIAGNOSIS — D509 Iron deficiency anemia, unspecified: Secondary | ICD-10-CM | POA: Diagnosis not present

## 2020-06-15 DIAGNOSIS — R17 Unspecified jaundice: Secondary | ICD-10-CM | POA: Diagnosis not present

## 2020-06-15 DIAGNOSIS — K769 Liver disease, unspecified: Secondary | ICD-10-CM | POA: Diagnosis not present

## 2020-06-15 DIAGNOSIS — Z4932 Encounter for adequacy testing for peritoneal dialysis: Secondary | ICD-10-CM | POA: Diagnosis not present

## 2020-06-15 DIAGNOSIS — D631 Anemia in chronic kidney disease: Secondary | ICD-10-CM | POA: Diagnosis not present

## 2020-06-15 DIAGNOSIS — N186 End stage renal disease: Secondary | ICD-10-CM | POA: Diagnosis not present

## 2020-06-15 DIAGNOSIS — N2581 Secondary hyperparathyroidism of renal origin: Secondary | ICD-10-CM | POA: Diagnosis not present

## 2020-06-15 DIAGNOSIS — E1122 Type 2 diabetes mellitus with diabetic chronic kidney disease: Secondary | ICD-10-CM | POA: Diagnosis not present

## 2020-06-15 DIAGNOSIS — Z992 Dependence on renal dialysis: Secondary | ICD-10-CM | POA: Diagnosis not present

## 2020-06-15 DIAGNOSIS — D509 Iron deficiency anemia, unspecified: Secondary | ICD-10-CM | POA: Diagnosis not present

## 2020-06-15 DIAGNOSIS — R82998 Other abnormal findings in urine: Secondary | ICD-10-CM | POA: Diagnosis not present

## 2020-06-17 DIAGNOSIS — Z992 Dependence on renal dialysis: Secondary | ICD-10-CM | POA: Diagnosis not present

## 2020-06-17 DIAGNOSIS — E1122 Type 2 diabetes mellitus with diabetic chronic kidney disease: Secondary | ICD-10-CM | POA: Diagnosis not present

## 2020-06-17 DIAGNOSIS — Z4932 Encounter for adequacy testing for peritoneal dialysis: Secondary | ICD-10-CM | POA: Diagnosis not present

## 2020-06-17 DIAGNOSIS — D631 Anemia in chronic kidney disease: Secondary | ICD-10-CM | POA: Diagnosis not present

## 2020-06-17 DIAGNOSIS — K769 Liver disease, unspecified: Secondary | ICD-10-CM | POA: Diagnosis not present

## 2020-06-17 DIAGNOSIS — N2581 Secondary hyperparathyroidism of renal origin: Secondary | ICD-10-CM | POA: Diagnosis not present

## 2020-06-17 DIAGNOSIS — N186 End stage renal disease: Secondary | ICD-10-CM | POA: Diagnosis not present

## 2020-06-17 DIAGNOSIS — R82998 Other abnormal findings in urine: Secondary | ICD-10-CM | POA: Diagnosis not present

## 2020-06-17 DIAGNOSIS — D509 Iron deficiency anemia, unspecified: Secondary | ICD-10-CM | POA: Diagnosis not present

## 2020-06-17 DIAGNOSIS — R17 Unspecified jaundice: Secondary | ICD-10-CM | POA: Diagnosis not present

## 2020-06-20 DIAGNOSIS — K769 Liver disease, unspecified: Secondary | ICD-10-CM | POA: Diagnosis not present

## 2020-06-20 DIAGNOSIS — Z4932 Encounter for adequacy testing for peritoneal dialysis: Secondary | ICD-10-CM | POA: Diagnosis not present

## 2020-06-20 DIAGNOSIS — E1122 Type 2 diabetes mellitus with diabetic chronic kidney disease: Secondary | ICD-10-CM | POA: Diagnosis not present

## 2020-06-20 DIAGNOSIS — D631 Anemia in chronic kidney disease: Secondary | ICD-10-CM | POA: Diagnosis not present

## 2020-06-20 DIAGNOSIS — N186 End stage renal disease: Secondary | ICD-10-CM | POA: Diagnosis not present

## 2020-06-20 DIAGNOSIS — Z992 Dependence on renal dialysis: Secondary | ICD-10-CM | POA: Diagnosis not present

## 2020-06-20 DIAGNOSIS — D509 Iron deficiency anemia, unspecified: Secondary | ICD-10-CM | POA: Diagnosis not present

## 2020-06-20 DIAGNOSIS — N2581 Secondary hyperparathyroidism of renal origin: Secondary | ICD-10-CM | POA: Diagnosis not present

## 2020-06-20 DIAGNOSIS — R17 Unspecified jaundice: Secondary | ICD-10-CM | POA: Diagnosis not present

## 2020-06-20 DIAGNOSIS — R82998 Other abnormal findings in urine: Secondary | ICD-10-CM | POA: Diagnosis not present

## 2020-06-22 DIAGNOSIS — D509 Iron deficiency anemia, unspecified: Secondary | ICD-10-CM | POA: Diagnosis not present

## 2020-06-22 DIAGNOSIS — Z992 Dependence on renal dialysis: Secondary | ICD-10-CM | POA: Diagnosis not present

## 2020-06-22 DIAGNOSIS — N2581 Secondary hyperparathyroidism of renal origin: Secondary | ICD-10-CM | POA: Diagnosis not present

## 2020-06-22 DIAGNOSIS — D631 Anemia in chronic kidney disease: Secondary | ICD-10-CM | POA: Diagnosis not present

## 2020-06-22 DIAGNOSIS — R82998 Other abnormal findings in urine: Secondary | ICD-10-CM | POA: Diagnosis not present

## 2020-06-22 DIAGNOSIS — R17 Unspecified jaundice: Secondary | ICD-10-CM | POA: Diagnosis not present

## 2020-06-22 DIAGNOSIS — N186 End stage renal disease: Secondary | ICD-10-CM | POA: Diagnosis not present

## 2020-06-22 DIAGNOSIS — E1122 Type 2 diabetes mellitus with diabetic chronic kidney disease: Secondary | ICD-10-CM | POA: Diagnosis not present

## 2020-06-22 DIAGNOSIS — K769 Liver disease, unspecified: Secondary | ICD-10-CM | POA: Diagnosis not present

## 2020-06-22 DIAGNOSIS — Z4932 Encounter for adequacy testing for peritoneal dialysis: Secondary | ICD-10-CM | POA: Diagnosis not present

## 2020-06-24 DIAGNOSIS — D631 Anemia in chronic kidney disease: Secondary | ICD-10-CM | POA: Diagnosis not present

## 2020-06-24 DIAGNOSIS — D509 Iron deficiency anemia, unspecified: Secondary | ICD-10-CM | POA: Diagnosis not present

## 2020-06-24 DIAGNOSIS — E1122 Type 2 diabetes mellitus with diabetic chronic kidney disease: Secondary | ICD-10-CM | POA: Diagnosis not present

## 2020-06-24 DIAGNOSIS — K769 Liver disease, unspecified: Secondary | ICD-10-CM | POA: Diagnosis not present

## 2020-06-24 DIAGNOSIS — Z992 Dependence on renal dialysis: Secondary | ICD-10-CM | POA: Diagnosis not present

## 2020-06-24 DIAGNOSIS — R82998 Other abnormal findings in urine: Secondary | ICD-10-CM | POA: Diagnosis not present

## 2020-06-24 DIAGNOSIS — R17 Unspecified jaundice: Secondary | ICD-10-CM | POA: Diagnosis not present

## 2020-06-24 DIAGNOSIS — N186 End stage renal disease: Secondary | ICD-10-CM | POA: Diagnosis not present

## 2020-06-24 DIAGNOSIS — Z4932 Encounter for adequacy testing for peritoneal dialysis: Secondary | ICD-10-CM | POA: Diagnosis not present

## 2020-06-24 DIAGNOSIS — N2581 Secondary hyperparathyroidism of renal origin: Secondary | ICD-10-CM | POA: Diagnosis not present

## 2020-06-27 DIAGNOSIS — D631 Anemia in chronic kidney disease: Secondary | ICD-10-CM | POA: Diagnosis not present

## 2020-06-27 DIAGNOSIS — N186 End stage renal disease: Secondary | ICD-10-CM | POA: Diagnosis not present

## 2020-06-27 DIAGNOSIS — K769 Liver disease, unspecified: Secondary | ICD-10-CM | POA: Diagnosis not present

## 2020-06-27 DIAGNOSIS — Z4932 Encounter for adequacy testing for peritoneal dialysis: Secondary | ICD-10-CM | POA: Diagnosis not present

## 2020-06-27 DIAGNOSIS — R17 Unspecified jaundice: Secondary | ICD-10-CM | POA: Diagnosis not present

## 2020-06-27 DIAGNOSIS — Z992 Dependence on renal dialysis: Secondary | ICD-10-CM | POA: Diagnosis not present

## 2020-06-27 DIAGNOSIS — E1122 Type 2 diabetes mellitus with diabetic chronic kidney disease: Secondary | ICD-10-CM | POA: Diagnosis not present

## 2020-06-27 DIAGNOSIS — N2581 Secondary hyperparathyroidism of renal origin: Secondary | ICD-10-CM | POA: Diagnosis not present

## 2020-06-27 DIAGNOSIS — R82998 Other abnormal findings in urine: Secondary | ICD-10-CM | POA: Diagnosis not present

## 2020-06-27 DIAGNOSIS — D509 Iron deficiency anemia, unspecified: Secondary | ICD-10-CM | POA: Diagnosis not present

## 2020-06-29 DIAGNOSIS — E1122 Type 2 diabetes mellitus with diabetic chronic kidney disease: Secondary | ICD-10-CM | POA: Diagnosis not present

## 2020-06-29 DIAGNOSIS — R17 Unspecified jaundice: Secondary | ICD-10-CM | POA: Diagnosis not present

## 2020-06-29 DIAGNOSIS — N186 End stage renal disease: Secondary | ICD-10-CM | POA: Diagnosis not present

## 2020-06-29 DIAGNOSIS — N2581 Secondary hyperparathyroidism of renal origin: Secondary | ICD-10-CM | POA: Diagnosis not present

## 2020-06-29 DIAGNOSIS — D509 Iron deficiency anemia, unspecified: Secondary | ICD-10-CM | POA: Diagnosis not present

## 2020-06-29 DIAGNOSIS — Z4932 Encounter for adequacy testing for peritoneal dialysis: Secondary | ICD-10-CM | POA: Diagnosis not present

## 2020-06-29 DIAGNOSIS — Z992 Dependence on renal dialysis: Secondary | ICD-10-CM | POA: Diagnosis not present

## 2020-06-29 DIAGNOSIS — D631 Anemia in chronic kidney disease: Secondary | ICD-10-CM | POA: Diagnosis not present

## 2020-06-29 DIAGNOSIS — R82998 Other abnormal findings in urine: Secondary | ICD-10-CM | POA: Diagnosis not present

## 2020-06-29 DIAGNOSIS — K769 Liver disease, unspecified: Secondary | ICD-10-CM | POA: Diagnosis not present

## 2020-07-01 DIAGNOSIS — K769 Liver disease, unspecified: Secondary | ICD-10-CM | POA: Diagnosis not present

## 2020-07-01 DIAGNOSIS — D631 Anemia in chronic kidney disease: Secondary | ICD-10-CM | POA: Diagnosis not present

## 2020-07-01 DIAGNOSIS — D509 Iron deficiency anemia, unspecified: Secondary | ICD-10-CM | POA: Diagnosis not present

## 2020-07-01 DIAGNOSIS — E1122 Type 2 diabetes mellitus with diabetic chronic kidney disease: Secondary | ICD-10-CM | POA: Diagnosis not present

## 2020-07-01 DIAGNOSIS — R82998 Other abnormal findings in urine: Secondary | ICD-10-CM | POA: Diagnosis not present

## 2020-07-01 DIAGNOSIS — Z4932 Encounter for adequacy testing for peritoneal dialysis: Secondary | ICD-10-CM | POA: Diagnosis not present

## 2020-07-01 DIAGNOSIS — Z992 Dependence on renal dialysis: Secondary | ICD-10-CM | POA: Diagnosis not present

## 2020-07-01 DIAGNOSIS — N186 End stage renal disease: Secondary | ICD-10-CM | POA: Diagnosis not present

## 2020-07-01 DIAGNOSIS — N2581 Secondary hyperparathyroidism of renal origin: Secondary | ICD-10-CM | POA: Diagnosis not present

## 2020-07-01 DIAGNOSIS — R17 Unspecified jaundice: Secondary | ICD-10-CM | POA: Diagnosis not present

## 2020-07-04 DIAGNOSIS — N2581 Secondary hyperparathyroidism of renal origin: Secondary | ICD-10-CM | POA: Diagnosis not present

## 2020-07-04 DIAGNOSIS — R17 Unspecified jaundice: Secondary | ICD-10-CM | POA: Diagnosis not present

## 2020-07-04 DIAGNOSIS — D631 Anemia in chronic kidney disease: Secondary | ICD-10-CM | POA: Diagnosis not present

## 2020-07-04 DIAGNOSIS — K769 Liver disease, unspecified: Secondary | ICD-10-CM | POA: Diagnosis not present

## 2020-07-04 DIAGNOSIS — R82998 Other abnormal findings in urine: Secondary | ICD-10-CM | POA: Diagnosis not present

## 2020-07-04 DIAGNOSIS — Z992 Dependence on renal dialysis: Secondary | ICD-10-CM | POA: Diagnosis not present

## 2020-07-04 DIAGNOSIS — N186 End stage renal disease: Secondary | ICD-10-CM | POA: Diagnosis not present

## 2020-07-06 DIAGNOSIS — R82998 Other abnormal findings in urine: Secondary | ICD-10-CM | POA: Diagnosis not present

## 2020-07-06 DIAGNOSIS — D631 Anemia in chronic kidney disease: Secondary | ICD-10-CM | POA: Diagnosis not present

## 2020-07-06 DIAGNOSIS — R17 Unspecified jaundice: Secondary | ICD-10-CM | POA: Diagnosis not present

## 2020-07-06 DIAGNOSIS — N2581 Secondary hyperparathyroidism of renal origin: Secondary | ICD-10-CM | POA: Diagnosis not present

## 2020-07-06 DIAGNOSIS — Z992 Dependence on renal dialysis: Secondary | ICD-10-CM | POA: Diagnosis not present

## 2020-07-06 DIAGNOSIS — N186 End stage renal disease: Secondary | ICD-10-CM | POA: Diagnosis not present

## 2020-07-06 DIAGNOSIS — K769 Liver disease, unspecified: Secondary | ICD-10-CM | POA: Diagnosis not present

## 2020-07-08 DIAGNOSIS — D631 Anemia in chronic kidney disease: Secondary | ICD-10-CM | POA: Diagnosis not present

## 2020-07-08 DIAGNOSIS — K769 Liver disease, unspecified: Secondary | ICD-10-CM | POA: Diagnosis not present

## 2020-07-08 DIAGNOSIS — R82998 Other abnormal findings in urine: Secondary | ICD-10-CM | POA: Diagnosis not present

## 2020-07-08 DIAGNOSIS — N186 End stage renal disease: Secondary | ICD-10-CM | POA: Diagnosis not present

## 2020-07-08 DIAGNOSIS — N2581 Secondary hyperparathyroidism of renal origin: Secondary | ICD-10-CM | POA: Diagnosis not present

## 2020-07-08 DIAGNOSIS — Z992 Dependence on renal dialysis: Secondary | ICD-10-CM | POA: Diagnosis not present

## 2020-07-08 DIAGNOSIS — R17 Unspecified jaundice: Secondary | ICD-10-CM | POA: Diagnosis not present

## 2020-07-10 DIAGNOSIS — I25118 Atherosclerotic heart disease of native coronary artery with other forms of angina pectoris: Secondary | ICD-10-CM | POA: Diagnosis not present

## 2020-07-10 DIAGNOSIS — N186 End stage renal disease: Secondary | ICD-10-CM | POA: Diagnosis not present

## 2020-07-10 DIAGNOSIS — I120 Hypertensive chronic kidney disease with stage 5 chronic kidney disease or end stage renal disease: Secondary | ICD-10-CM | POA: Diagnosis not present

## 2020-07-10 DIAGNOSIS — I5022 Chronic systolic (congestive) heart failure: Secondary | ICD-10-CM | POA: Diagnosis not present

## 2020-07-10 DIAGNOSIS — E7800 Pure hypercholesterolemia, unspecified: Secondary | ICD-10-CM | POA: Diagnosis not present

## 2020-07-10 DIAGNOSIS — E1122 Type 2 diabetes mellitus with diabetic chronic kidney disease: Secondary | ICD-10-CM | POA: Diagnosis not present

## 2020-07-11 DIAGNOSIS — R82998 Other abnormal findings in urine: Secondary | ICD-10-CM | POA: Diagnosis not present

## 2020-07-11 DIAGNOSIS — I5022 Chronic systolic (congestive) heart failure: Secondary | ICD-10-CM | POA: Diagnosis not present

## 2020-07-11 DIAGNOSIS — N2581 Secondary hyperparathyroidism of renal origin: Secondary | ICD-10-CM | POA: Diagnosis not present

## 2020-07-11 DIAGNOSIS — N186 End stage renal disease: Secondary | ICD-10-CM | POA: Diagnosis not present

## 2020-07-11 DIAGNOSIS — D631 Anemia in chronic kidney disease: Secondary | ICD-10-CM | POA: Diagnosis not present

## 2020-07-11 DIAGNOSIS — R17 Unspecified jaundice: Secondary | ICD-10-CM | POA: Diagnosis not present

## 2020-07-11 DIAGNOSIS — K769 Liver disease, unspecified: Secondary | ICD-10-CM | POA: Diagnosis not present

## 2020-07-11 DIAGNOSIS — Z992 Dependence on renal dialysis: Secondary | ICD-10-CM | POA: Diagnosis not present

## 2020-07-13 DIAGNOSIS — D631 Anemia in chronic kidney disease: Secondary | ICD-10-CM | POA: Diagnosis not present

## 2020-07-13 DIAGNOSIS — R82998 Other abnormal findings in urine: Secondary | ICD-10-CM | POA: Diagnosis not present

## 2020-07-13 DIAGNOSIS — R17 Unspecified jaundice: Secondary | ICD-10-CM | POA: Diagnosis not present

## 2020-07-13 DIAGNOSIS — N186 End stage renal disease: Secondary | ICD-10-CM | POA: Diagnosis not present

## 2020-07-13 DIAGNOSIS — Z992 Dependence on renal dialysis: Secondary | ICD-10-CM | POA: Diagnosis not present

## 2020-07-13 DIAGNOSIS — N2581 Secondary hyperparathyroidism of renal origin: Secondary | ICD-10-CM | POA: Diagnosis not present

## 2020-07-13 DIAGNOSIS — K769 Liver disease, unspecified: Secondary | ICD-10-CM | POA: Diagnosis not present

## 2020-07-15 DIAGNOSIS — R82998 Other abnormal findings in urine: Secondary | ICD-10-CM | POA: Diagnosis not present

## 2020-07-15 DIAGNOSIS — N186 End stage renal disease: Secondary | ICD-10-CM | POA: Diagnosis not present

## 2020-07-15 DIAGNOSIS — D631 Anemia in chronic kidney disease: Secondary | ICD-10-CM | POA: Diagnosis not present

## 2020-07-15 DIAGNOSIS — K769 Liver disease, unspecified: Secondary | ICD-10-CM | POA: Diagnosis not present

## 2020-07-15 DIAGNOSIS — Z992 Dependence on renal dialysis: Secondary | ICD-10-CM | POA: Diagnosis not present

## 2020-07-15 DIAGNOSIS — N2581 Secondary hyperparathyroidism of renal origin: Secondary | ICD-10-CM | POA: Diagnosis not present

## 2020-07-15 DIAGNOSIS — R17 Unspecified jaundice: Secondary | ICD-10-CM | POA: Diagnosis not present

## 2020-07-18 DIAGNOSIS — R17 Unspecified jaundice: Secondary | ICD-10-CM | POA: Diagnosis not present

## 2020-07-18 DIAGNOSIS — Z992 Dependence on renal dialysis: Secondary | ICD-10-CM | POA: Diagnosis not present

## 2020-07-18 DIAGNOSIS — N186 End stage renal disease: Secondary | ICD-10-CM | POA: Diagnosis not present

## 2020-07-18 DIAGNOSIS — R82998 Other abnormal findings in urine: Secondary | ICD-10-CM | POA: Diagnosis not present

## 2020-07-18 DIAGNOSIS — K769 Liver disease, unspecified: Secondary | ICD-10-CM | POA: Diagnosis not present

## 2020-07-18 DIAGNOSIS — D631 Anemia in chronic kidney disease: Secondary | ICD-10-CM | POA: Diagnosis not present

## 2020-07-18 DIAGNOSIS — N2581 Secondary hyperparathyroidism of renal origin: Secondary | ICD-10-CM | POA: Diagnosis not present

## 2020-07-20 DIAGNOSIS — N2581 Secondary hyperparathyroidism of renal origin: Secondary | ICD-10-CM | POA: Diagnosis not present

## 2020-07-20 DIAGNOSIS — D631 Anemia in chronic kidney disease: Secondary | ICD-10-CM | POA: Diagnosis not present

## 2020-07-20 DIAGNOSIS — K769 Liver disease, unspecified: Secondary | ICD-10-CM | POA: Diagnosis not present

## 2020-07-20 DIAGNOSIS — R82998 Other abnormal findings in urine: Secondary | ICD-10-CM | POA: Diagnosis not present

## 2020-07-20 DIAGNOSIS — N186 End stage renal disease: Secondary | ICD-10-CM | POA: Diagnosis not present

## 2020-07-20 DIAGNOSIS — R17 Unspecified jaundice: Secondary | ICD-10-CM | POA: Diagnosis not present

## 2020-07-20 DIAGNOSIS — Z992 Dependence on renal dialysis: Secondary | ICD-10-CM | POA: Diagnosis not present

## 2020-07-21 DIAGNOSIS — E1122 Type 2 diabetes mellitus with diabetic chronic kidney disease: Secondary | ICD-10-CM | POA: Diagnosis not present

## 2020-07-21 DIAGNOSIS — I120 Hypertensive chronic kidney disease with stage 5 chronic kidney disease or end stage renal disease: Secondary | ICD-10-CM | POA: Diagnosis not present

## 2020-07-21 DIAGNOSIS — N184 Chronic kidney disease, stage 4 (severe): Secondary | ICD-10-CM | POA: Diagnosis not present

## 2020-07-21 DIAGNOSIS — Z4902 Encounter for fitting and adjustment of peritoneal dialysis catheter: Secondary | ICD-10-CM | POA: Diagnosis not present

## 2020-07-22 DIAGNOSIS — K769 Liver disease, unspecified: Secondary | ICD-10-CM | POA: Diagnosis not present

## 2020-07-22 DIAGNOSIS — R17 Unspecified jaundice: Secondary | ICD-10-CM | POA: Diagnosis not present

## 2020-07-22 DIAGNOSIS — N2581 Secondary hyperparathyroidism of renal origin: Secondary | ICD-10-CM | POA: Diagnosis not present

## 2020-07-22 DIAGNOSIS — D631 Anemia in chronic kidney disease: Secondary | ICD-10-CM | POA: Diagnosis not present

## 2020-07-22 DIAGNOSIS — R82998 Other abnormal findings in urine: Secondary | ICD-10-CM | POA: Diagnosis not present

## 2020-07-22 DIAGNOSIS — Z992 Dependence on renal dialysis: Secondary | ICD-10-CM | POA: Diagnosis not present

## 2020-07-22 DIAGNOSIS — N186 End stage renal disease: Secondary | ICD-10-CM | POA: Diagnosis not present

## 2020-07-25 DIAGNOSIS — R17 Unspecified jaundice: Secondary | ICD-10-CM | POA: Diagnosis not present

## 2020-07-25 DIAGNOSIS — R82998 Other abnormal findings in urine: Secondary | ICD-10-CM | POA: Diagnosis not present

## 2020-07-25 DIAGNOSIS — N186 End stage renal disease: Secondary | ICD-10-CM | POA: Diagnosis not present

## 2020-07-25 DIAGNOSIS — Z992 Dependence on renal dialysis: Secondary | ICD-10-CM | POA: Diagnosis not present

## 2020-07-25 DIAGNOSIS — K769 Liver disease, unspecified: Secondary | ICD-10-CM | POA: Diagnosis not present

## 2020-07-25 DIAGNOSIS — N2581 Secondary hyperparathyroidism of renal origin: Secondary | ICD-10-CM | POA: Diagnosis not present

## 2020-07-25 DIAGNOSIS — D631 Anemia in chronic kidney disease: Secondary | ICD-10-CM | POA: Diagnosis not present

## 2020-07-26 DIAGNOSIS — N186 End stage renal disease: Secondary | ICD-10-CM | POA: Diagnosis not present

## 2020-07-26 DIAGNOSIS — E1122 Type 2 diabetes mellitus with diabetic chronic kidney disease: Secondary | ICD-10-CM | POA: Diagnosis not present

## 2020-07-26 DIAGNOSIS — Z794 Long term (current) use of insulin: Secondary | ICD-10-CM | POA: Diagnosis not present

## 2020-07-26 DIAGNOSIS — I120 Hypertensive chronic kidney disease with stage 5 chronic kidney disease or end stage renal disease: Secondary | ICD-10-CM | POA: Diagnosis not present

## 2020-07-26 DIAGNOSIS — Z4902 Encounter for fitting and adjustment of peritoneal dialysis catheter: Secondary | ICD-10-CM | POA: Diagnosis not present

## 2020-07-26 DIAGNOSIS — Z01812 Encounter for preprocedural laboratory examination: Secondary | ICD-10-CM | POA: Diagnosis not present

## 2020-07-27 DIAGNOSIS — R82998 Other abnormal findings in urine: Secondary | ICD-10-CM | POA: Diagnosis not present

## 2020-07-27 DIAGNOSIS — Z992 Dependence on renal dialysis: Secondary | ICD-10-CM | POA: Diagnosis not present

## 2020-07-27 DIAGNOSIS — D631 Anemia in chronic kidney disease: Secondary | ICD-10-CM | POA: Diagnosis not present

## 2020-07-27 DIAGNOSIS — N186 End stage renal disease: Secondary | ICD-10-CM | POA: Diagnosis not present

## 2020-07-27 DIAGNOSIS — N2581 Secondary hyperparathyroidism of renal origin: Secondary | ICD-10-CM | POA: Diagnosis not present

## 2020-07-27 DIAGNOSIS — R17 Unspecified jaundice: Secondary | ICD-10-CM | POA: Diagnosis not present

## 2020-07-27 DIAGNOSIS — K769 Liver disease, unspecified: Secondary | ICD-10-CM | POA: Diagnosis not present

## 2020-07-29 DIAGNOSIS — K769 Liver disease, unspecified: Secondary | ICD-10-CM | POA: Diagnosis not present

## 2020-07-29 DIAGNOSIS — N186 End stage renal disease: Secondary | ICD-10-CM | POA: Diagnosis not present

## 2020-07-29 DIAGNOSIS — R82998 Other abnormal findings in urine: Secondary | ICD-10-CM | POA: Diagnosis not present

## 2020-07-29 DIAGNOSIS — R17 Unspecified jaundice: Secondary | ICD-10-CM | POA: Diagnosis not present

## 2020-07-29 DIAGNOSIS — Z992 Dependence on renal dialysis: Secondary | ICD-10-CM | POA: Diagnosis not present

## 2020-07-29 DIAGNOSIS — N2581 Secondary hyperparathyroidism of renal origin: Secondary | ICD-10-CM | POA: Diagnosis not present

## 2020-07-29 DIAGNOSIS — D631 Anemia in chronic kidney disease: Secondary | ICD-10-CM | POA: Diagnosis not present

## 2020-08-01 DIAGNOSIS — Z4902 Encounter for fitting and adjustment of peritoneal dialysis catheter: Secondary | ICD-10-CM | POA: Diagnosis not present

## 2020-08-01 DIAGNOSIS — I2510 Atherosclerotic heart disease of native coronary artery without angina pectoris: Secondary | ICD-10-CM | POA: Diagnosis not present

## 2020-08-01 DIAGNOSIS — Z87891 Personal history of nicotine dependence: Secondary | ICD-10-CM | POA: Diagnosis not present

## 2020-08-01 DIAGNOSIS — I252 Old myocardial infarction: Secondary | ICD-10-CM | POA: Diagnosis not present

## 2020-08-01 DIAGNOSIS — I132 Hypertensive heart and chronic kidney disease with heart failure and with stage 5 chronic kidney disease, or end stage renal disease: Secondary | ICD-10-CM | POA: Diagnosis not present

## 2020-08-01 DIAGNOSIS — N184 Chronic kidney disease, stage 4 (severe): Secondary | ICD-10-CM | POA: Diagnosis not present

## 2020-08-01 DIAGNOSIS — E1122 Type 2 diabetes mellitus with diabetic chronic kidney disease: Secondary | ICD-10-CM | POA: Diagnosis not present

## 2020-08-01 DIAGNOSIS — N189 Chronic kidney disease, unspecified: Secondary | ICD-10-CM | POA: Diagnosis not present

## 2020-08-18 DIAGNOSIS — I129 Hypertensive chronic kidney disease with stage 1 through stage 4 chronic kidney disease, or unspecified chronic kidney disease: Secondary | ICD-10-CM | POA: Diagnosis not present

## 2020-08-18 DIAGNOSIS — N184 Chronic kidney disease, stage 4 (severe): Secondary | ICD-10-CM | POA: Diagnosis not present

## 2020-08-18 DIAGNOSIS — N2581 Secondary hyperparathyroidism of renal origin: Secondary | ICD-10-CM | POA: Diagnosis not present

## 2020-08-18 DIAGNOSIS — E1122 Type 2 diabetes mellitus with diabetic chronic kidney disease: Secondary | ICD-10-CM | POA: Diagnosis not present

## 2020-08-23 DIAGNOSIS — N186 End stage renal disease: Secondary | ICD-10-CM | POA: Diagnosis not present

## 2020-08-23 DIAGNOSIS — Z794 Long term (current) use of insulin: Secondary | ICD-10-CM | POA: Diagnosis not present

## 2020-08-23 DIAGNOSIS — Z992 Dependence on renal dialysis: Secondary | ICD-10-CM | POA: Diagnosis not present

## 2020-08-23 DIAGNOSIS — N183 Chronic kidney disease, stage 3 unspecified: Secondary | ICD-10-CM | POA: Diagnosis not present

## 2020-08-23 DIAGNOSIS — E1122 Type 2 diabetes mellitus with diabetic chronic kidney disease: Secondary | ICD-10-CM | POA: Diagnosis not present

## 2020-09-08 DIAGNOSIS — R6889 Other general symptoms and signs: Secondary | ICD-10-CM | POA: Diagnosis not present

## 2020-09-08 DIAGNOSIS — Z20822 Contact with and (suspected) exposure to covid-19: Secondary | ICD-10-CM | POA: Diagnosis not present

## 2020-11-16 DIAGNOSIS — N184 Chronic kidney disease, stage 4 (severe): Secondary | ICD-10-CM | POA: Diagnosis not present

## 2020-11-16 DIAGNOSIS — I509 Heart failure, unspecified: Secondary | ICD-10-CM | POA: Diagnosis not present

## 2020-11-16 DIAGNOSIS — N2581 Secondary hyperparathyroidism of renal origin: Secondary | ICD-10-CM | POA: Diagnosis not present

## 2020-11-16 DIAGNOSIS — N189 Chronic kidney disease, unspecified: Secondary | ICD-10-CM | POA: Diagnosis not present

## 2020-11-16 DIAGNOSIS — E1122 Type 2 diabetes mellitus with diabetic chronic kidney disease: Secondary | ICD-10-CM | POA: Diagnosis not present

## 2020-11-16 DIAGNOSIS — I129 Hypertensive chronic kidney disease with stage 1 through stage 4 chronic kidney disease, or unspecified chronic kidney disease: Secondary | ICD-10-CM | POA: Diagnosis not present

## 2020-12-04 DIAGNOSIS — Z794 Long term (current) use of insulin: Secondary | ICD-10-CM | POA: Diagnosis not present

## 2020-12-04 DIAGNOSIS — E7800 Pure hypercholesterolemia, unspecified: Secondary | ICD-10-CM | POA: Diagnosis not present

## 2020-12-04 DIAGNOSIS — I25118 Atherosclerotic heart disease of native coronary artery with other forms of angina pectoris: Secondary | ICD-10-CM | POA: Diagnosis not present

## 2020-12-04 DIAGNOSIS — Z992 Dependence on renal dialysis: Secondary | ICD-10-CM | POA: Diagnosis not present

## 2020-12-04 DIAGNOSIS — I132 Hypertensive heart and chronic kidney disease with heart failure and with stage 5 chronic kidney disease, or end stage renal disease: Secondary | ICD-10-CM | POA: Diagnosis not present

## 2020-12-04 DIAGNOSIS — E1122 Type 2 diabetes mellitus with diabetic chronic kidney disease: Secondary | ICD-10-CM | POA: Diagnosis not present

## 2020-12-04 DIAGNOSIS — I5022 Chronic systolic (congestive) heart failure: Secondary | ICD-10-CM | POA: Diagnosis not present

## 2020-12-04 DIAGNOSIS — N186 End stage renal disease: Secondary | ICD-10-CM | POA: Diagnosis not present

## 2020-12-04 DIAGNOSIS — I480 Paroxysmal atrial fibrillation: Secondary | ICD-10-CM | POA: Diagnosis not present

## 2020-12-07 DIAGNOSIS — N2581 Secondary hyperparathyroidism of renal origin: Secondary | ICD-10-CM | POA: Diagnosis not present

## 2020-12-28 DIAGNOSIS — Z79899 Other long term (current) drug therapy: Secondary | ICD-10-CM | POA: Diagnosis not present

## 2020-12-28 DIAGNOSIS — I509 Heart failure, unspecified: Secondary | ICD-10-CM | POA: Diagnosis not present

## 2020-12-28 DIAGNOSIS — I4891 Unspecified atrial fibrillation: Secondary | ICD-10-CM | POA: Diagnosis not present

## 2020-12-28 DIAGNOSIS — C8590 Non-Hodgkin lymphoma, unspecified, unspecified site: Secondary | ICD-10-CM | POA: Diagnosis not present

## 2020-12-28 DIAGNOSIS — M159 Polyosteoarthritis, unspecified: Secondary | ICD-10-CM | POA: Diagnosis not present

## 2020-12-28 DIAGNOSIS — N185 Chronic kidney disease, stage 5: Secondary | ICD-10-CM | POA: Diagnosis not present

## 2020-12-28 DIAGNOSIS — Z7689 Persons encountering health services in other specified circumstances: Secondary | ICD-10-CM | POA: Diagnosis not present

## 2020-12-28 DIAGNOSIS — I10 Essential (primary) hypertension: Secondary | ICD-10-CM | POA: Diagnosis not present

## 2020-12-28 DIAGNOSIS — J449 Chronic obstructive pulmonary disease, unspecified: Secondary | ICD-10-CM | POA: Diagnosis not present

## 2021-01-04 DIAGNOSIS — N184 Chronic kidney disease, stage 4 (severe): Secondary | ICD-10-CM | POA: Diagnosis not present

## 2021-01-25 ENCOUNTER — Encounter: Payer: Self-pay | Admitting: Podiatry

## 2021-01-25 ENCOUNTER — Other Ambulatory Visit: Payer: Self-pay

## 2021-01-25 ENCOUNTER — Ambulatory Visit: Payer: Medicare Other | Admitting: Podiatry

## 2021-01-25 DIAGNOSIS — B351 Tinea unguium: Secondary | ICD-10-CM | POA: Diagnosis not present

## 2021-01-25 DIAGNOSIS — E119 Type 2 diabetes mellitus without complications: Secondary | ICD-10-CM

## 2021-01-25 DIAGNOSIS — E1151 Type 2 diabetes mellitus with diabetic peripheral angiopathy without gangrene: Secondary | ICD-10-CM

## 2021-01-25 DIAGNOSIS — L84 Corns and callosities: Secondary | ICD-10-CM | POA: Diagnosis not present

## 2021-01-25 NOTE — Patient Instructions (Signed)
Diabetes Mellitus and Foot Care Foot care is an important part of your health, especially when you have diabetes. Diabetes may cause you to have problems because of poor blood flow (circulation) to your feet and legs, which can cause your skin to:  Become thinner and drier.  Break more easily.  Heal more slowly.  Peel and crack. You may also have nerve damage (neuropathy) in your legs and feet, causing decreased feeling in them. This means that you may not notice minor injuries to your feet that could lead to more serious problems. Noticing and addressing any potential problems early is the best way to prevent future foot problems. How to care for your feet Foot hygiene  Wash your feet daily with warm water and mild soap. Do not use hot water. Then, pat your feet and the areas between your toes until they are completely dry. Do not soak your feet as this can dry your skin.  Trim your toenails straight across. Do not dig under them or around the cuticle. File the edges of your nails with an emery board or nail file.  Apply a moisturizing lotion or petroleum jelly to the skin on your feet and to dry, brittle toenails. Use lotion that does not contain alcohol and is unscented. Do not apply lotion between your toes.   Shoes and socks  Wear clean socks or stockings every day. Make sure they are not too tight. Do not wear knee-high stockings since they may decrease blood flow to your legs.  Wear shoes that fit properly and have enough cushioning. Always look in your shoes before you put them on to be sure there are no objects inside.  To break in new shoes, wear them for just a few hours a day. This prevents injuries on your feet. Wounds, scrapes, corns, and calluses  Check your feet daily for blisters, cuts, bruises, sores, and redness. If you cannot see the bottom of your feet, use a mirror or ask someone for help.  Do not cut corns or calluses or try to remove them with medicine.  If you  find a minor scrape, cut, or break in the skin on your feet, keep it and the skin around it clean and dry. You may clean these areas with mild soap and water. Do not clean the area with peroxide, alcohol, or iodine.  If you have a wound, scrape, corn, or callus on your foot, look at it several times a day to make sure it is healing and not infected. Check for: ? Redness, swelling, or pain. ? Fluid or blood. ? Warmth. ? Pus or a bad smell.   General tips  Do not cross your legs. This may decrease blood flow to your feet.  Do not use heating pads or hot water bottles on your feet. They may burn your skin. If you have lost feeling in your feet or legs, you may not know this is happening until it is too late.  Protect your feet from hot and cold by wearing shoes, such as at the beach or on hot pavement.  Schedule a complete foot exam at least once a year (annually) or more often if you have foot problems. Report any cuts, sores, or bruises to your health care provider immediately. Where to find more information  American Diabetes Association: www.diabetes.org  Association of Diabetes Care & Education Specialists: www.diabeteseducator.org Contact a health care provider if:  You have a medical condition that increases your risk of infection and   you have any cuts, sores, or bruises on your feet.  You have an injury that is not healing.  You have redness on your legs or feet.  You feel burning or tingling in your legs or feet.  You have pain or cramps in your legs and feet.  Your legs or feet are numb.  Your feet always feel cold.  You have pain around any toenails. Get help right away if:  You have a wound, scrape, corn, or callus on your foot and: ? You have pain, swelling, or redness that gets worse. ? You have fluid or blood coming from the wound, scrape, corn, or callus. ? Your wound, scrape, corn, or callus feels warm to the touch. ? You have pus or a bad smell coming from  the wound, scrape, corn, or callus. ? You have a fever. ? You have a red line going up your leg. Summary  Check your feet every day for blisters, cuts, bruises, sores, and redness.  Apply a moisturizing lotion or petroleum jelly to the skin on your feet and to dry, brittle toenails.  Wear shoes that fit properly and have enough cushioning.  If you have foot problems, report any cuts, sores, or bruises to your health care provider immediately.  Schedule a complete foot exam at least once a year (annually) or more often if you have foot problems. This information is not intended to replace advice given to you by your health care provider. Make sure you discuss any questions you have with your health care provider. Document Revised: 03/09/2020 Document Reviewed: 03/09/2020 Elsevier Patient Education  2021 Elsevier Inc.  

## 2021-01-25 NOTE — Progress Notes (Signed)
  Subjective:  Patient ID: Curtis Mays, male    DOB: [birth year],  MRN: [id number]  Chief Complaint  Patient presents with  . Nail Problem    The right big toenail is thick and discolored and I am a diabetic   . Callouses    The spot is in between the 4th and 5th toe on the right    60 y.o. male presents with the above complaint. History confirmed with patient. States the toes on the right foot hurt, especially the first toe and 4th toe. States the right great toenail has been lost twice before and grew back thick. Thinks he has a corn on the right 4th/5th toes.  Objective:  Physical Exam: warm, good capillary refill, nail exam onychomycosis of the toenails - right hallux thickened and dystrophic, no trophic changes or ulcerative lesions. DP pulses palpable and protective sensation intact. PT pulses absent. Absent pedal hair. Varicosities ankle bilat. Lesser digit hammmertoes noted. 4th interspace right interdigital corn with POP.   No images are attached to the encounter.  Assessment:   1. Diabetes mellitus type 2 with peripheral artery disease ([place])   2. Onychomycosis   3. Callus   4. Encounter for diabetic foot exam [facility])      Plan:  Patient was evaluated and treated and all questions answered.  Onychomycosis, Diabetes and PAD -Patient is diabetic with a qualifying condition for at risk foot care. -Educated on DM Footcare.  Procedure: Nail Debridement Type of Debridement: manual, sharp debridement. Instrumentation: Nail nipper, rotary burr. Number of Nails: 10  Procedure: Paring of Lesion Rationale: painful hyperkeratotic lesion Type of Debridement: manual, sharp debridement. Instrumentation: 312 blade Number of Lesions: 1  Return in about 3 months (around 04/27/2021) for Diabetic Foot Care.

## 2021-01-31 DIAGNOSIS — E1122 Type 2 diabetes mellitus with diabetic chronic kidney disease: Secondary | ICD-10-CM | POA: Diagnosis not present

## 2021-01-31 DIAGNOSIS — N2581 Secondary hyperparathyroidism of renal origin: Secondary | ICD-10-CM | POA: Diagnosis not present

## 2021-01-31 DIAGNOSIS — N184 Chronic kidney disease, stage 4 (severe): Secondary | ICD-10-CM | POA: Diagnosis not present

## 2021-01-31 DIAGNOSIS — I509 Heart failure, unspecified: Secondary | ICD-10-CM | POA: Diagnosis not present

## 2021-01-31 DIAGNOSIS — I129 Hypertensive chronic kidney disease with stage 1 through stage 4 chronic kidney disease, or unspecified chronic kidney disease: Secondary | ICD-10-CM | POA: Diagnosis not present

## 2021-03-20 DIAGNOSIS — Z79899 Other long term (current) drug therapy: Secondary | ICD-10-CM | POA: Diagnosis not present

## 2021-03-20 DIAGNOSIS — Z7689 Persons encountering health services in other specified circumstances: Secondary | ICD-10-CM | POA: Diagnosis not present

## 2021-03-20 DIAGNOSIS — N185 Chronic kidney disease, stage 5: Secondary | ICD-10-CM | POA: Diagnosis not present

## 2021-03-20 DIAGNOSIS — I509 Heart failure, unspecified: Secondary | ICD-10-CM | POA: Diagnosis not present

## 2021-03-20 DIAGNOSIS — M159 Polyosteoarthritis, unspecified: Secondary | ICD-10-CM | POA: Diagnosis not present

## 2021-03-20 DIAGNOSIS — I4891 Unspecified atrial fibrillation: Secondary | ICD-10-CM | POA: Diagnosis not present

## 2021-03-20 DIAGNOSIS — J449 Chronic obstructive pulmonary disease, unspecified: Secondary | ICD-10-CM | POA: Diagnosis not present

## 2021-03-20 DIAGNOSIS — C8590 Non-Hodgkin lymphoma, unspecified, unspecified site: Secondary | ICD-10-CM | POA: Diagnosis not present

## 2021-03-20 DIAGNOSIS — E1165 Type 2 diabetes mellitus with hyperglycemia: Secondary | ICD-10-CM | POA: Diagnosis not present

## 2021-03-20 DIAGNOSIS — I10 Essential (primary) hypertension: Secondary | ICD-10-CM | POA: Diagnosis not present

## 2021-04-04 ENCOUNTER — Encounter: Payer: Self-pay | Admitting: Podiatry

## 2021-04-26 ENCOUNTER — Ambulatory Visit: Payer: Medicare Other | Admitting: Podiatry

## 2021-05-03 DIAGNOSIS — I208 Other forms of angina pectoris: Secondary | ICD-10-CM | POA: Diagnosis not present

## 2021-05-03 DIAGNOSIS — I509 Heart failure, unspecified: Secondary | ICD-10-CM | POA: Diagnosis not present

## 2021-05-03 DIAGNOSIS — N184 Chronic kidney disease, stage 4 (severe): Secondary | ICD-10-CM | POA: Diagnosis not present

## 2021-05-03 DIAGNOSIS — E1122 Type 2 diabetes mellitus with diabetic chronic kidney disease: Secondary | ICD-10-CM | POA: Diagnosis not present

## 2021-05-03 DIAGNOSIS — I129 Hypertensive chronic kidney disease with stage 1 through stage 4 chronic kidney disease, or unspecified chronic kidney disease: Secondary | ICD-10-CM | POA: Diagnosis not present

## 2021-05-03 DIAGNOSIS — N2581 Secondary hyperparathyroidism of renal origin: Secondary | ICD-10-CM | POA: Diagnosis not present

## 2021-06-15 DIAGNOSIS — I5022 Chronic systolic (congestive) heart failure: Secondary | ICD-10-CM | POA: Diagnosis not present

## 2021-06-15 DIAGNOSIS — D631 Anemia in chronic kidney disease: Secondary | ICD-10-CM | POA: Diagnosis not present

## 2021-06-15 DIAGNOSIS — E559 Vitamin D deficiency, unspecified: Secondary | ICD-10-CM | POA: Diagnosis not present

## 2021-06-15 DIAGNOSIS — E785 Hyperlipidemia, unspecified: Secondary | ICD-10-CM | POA: Diagnosis not present

## 2021-06-15 DIAGNOSIS — K7460 Unspecified cirrhosis of liver: Secondary | ICD-10-CM | POA: Diagnosis not present

## 2021-06-15 DIAGNOSIS — I25118 Atherosclerotic heart disease of native coronary artery with other forms of angina pectoris: Secondary | ICD-10-CM | POA: Diagnosis not present

## 2021-06-15 DIAGNOSIS — I10 Essential (primary) hypertension: Secondary | ICD-10-CM | POA: Diagnosis not present

## 2021-06-15 DIAGNOSIS — E118 Type 2 diabetes mellitus with unspecified complications: Secondary | ICD-10-CM | POA: Diagnosis not present

## 2021-06-15 DIAGNOSIS — N2581 Secondary hyperparathyroidism of renal origin: Secondary | ICD-10-CM | POA: Diagnosis not present

## 2021-06-15 DIAGNOSIS — R809 Proteinuria, unspecified: Secondary | ICD-10-CM | POA: Diagnosis not present

## 2021-06-15 DIAGNOSIS — E1140 Type 2 diabetes mellitus with diabetic neuropathy, unspecified: Secondary | ICD-10-CM | POA: Diagnosis not present

## 2021-06-15 DIAGNOSIS — C8590 Non-Hodgkin lymphoma, unspecified, unspecified site: Secondary | ICD-10-CM | POA: Diagnosis not present

## 2021-06-20 DIAGNOSIS — I10 Essential (primary) hypertension: Secondary | ICD-10-CM | POA: Diagnosis not present

## 2021-06-20 DIAGNOSIS — J449 Chronic obstructive pulmonary disease, unspecified: Secondary | ICD-10-CM | POA: Diagnosis not present

## 2021-06-20 DIAGNOSIS — N185 Chronic kidney disease, stage 5: Secondary | ICD-10-CM | POA: Diagnosis not present

## 2021-06-20 DIAGNOSIS — E1165 Type 2 diabetes mellitus with hyperglycemia: Secondary | ICD-10-CM | POA: Diagnosis not present

## 2021-06-20 DIAGNOSIS — M159 Polyosteoarthritis, unspecified: Secondary | ICD-10-CM | POA: Diagnosis not present

## 2021-06-20 DIAGNOSIS — I509 Heart failure, unspecified: Secondary | ICD-10-CM | POA: Diagnosis not present

## 2021-06-20 DIAGNOSIS — Z79899 Other long term (current) drug therapy: Secondary | ICD-10-CM | POA: Diagnosis not present

## 2021-06-20 DIAGNOSIS — I4891 Unspecified atrial fibrillation: Secondary | ICD-10-CM | POA: Diagnosis not present

## 2021-06-20 DIAGNOSIS — C8590 Non-Hodgkin lymphoma, unspecified, unspecified site: Secondary | ICD-10-CM | POA: Diagnosis not present

## 2021-09-10 DIAGNOSIS — I129 Hypertensive chronic kidney disease with stage 1 through stage 4 chronic kidney disease, or unspecified chronic kidney disease: Secondary | ICD-10-CM | POA: Diagnosis not present

## 2021-09-10 DIAGNOSIS — I208 Other forms of angina pectoris: Secondary | ICD-10-CM | POA: Diagnosis not present

## 2021-09-10 DIAGNOSIS — E785 Hyperlipidemia, unspecified: Secondary | ICD-10-CM | POA: Diagnosis not present

## 2021-09-10 DIAGNOSIS — I509 Heart failure, unspecified: Secondary | ICD-10-CM | POA: Diagnosis not present

## 2021-09-10 DIAGNOSIS — N2581 Secondary hyperparathyroidism of renal origin: Secondary | ICD-10-CM | POA: Diagnosis not present

## 2021-09-10 DIAGNOSIS — E1122 Type 2 diabetes mellitus with diabetic chronic kidney disease: Secondary | ICD-10-CM | POA: Diagnosis not present

## 2021-09-10 DIAGNOSIS — N184 Chronic kidney disease, stage 4 (severe): Secondary | ICD-10-CM | POA: Diagnosis not present

## 2021-09-10 DIAGNOSIS — N189 Chronic kidney disease, unspecified: Secondary | ICD-10-CM | POA: Diagnosis not present

## 2021-09-21 DIAGNOSIS — Z992 Dependence on renal dialysis: Secondary | ICD-10-CM | POA: Diagnosis not present

## 2021-09-21 DIAGNOSIS — N186 End stage renal disease: Secondary | ICD-10-CM | POA: Diagnosis not present

## 2021-09-21 DIAGNOSIS — Z794 Long term (current) use of insulin: Secondary | ICD-10-CM | POA: Diagnosis not present

## 2021-09-21 DIAGNOSIS — I480 Paroxysmal atrial fibrillation: Secondary | ICD-10-CM | POA: Diagnosis not present

## 2021-09-21 DIAGNOSIS — I130 Hypertensive heart and chronic kidney disease with heart failure and stage 1 through stage 4 chronic kidney disease, or unspecified chronic kidney disease: Secondary | ICD-10-CM | POA: Diagnosis not present

## 2021-09-21 DIAGNOSIS — E1122 Type 2 diabetes mellitus with diabetic chronic kidney disease: Secondary | ICD-10-CM | POA: Diagnosis not present

## 2021-09-21 DIAGNOSIS — I5022 Chronic systolic (congestive) heart failure: Secondary | ICD-10-CM | POA: Diagnosis not present

## 2021-09-21 DIAGNOSIS — I25118 Atherosclerotic heart disease of native coronary artery with other forms of angina pectoris: Secondary | ICD-10-CM | POA: Diagnosis not present

## 2021-10-09 DIAGNOSIS — L989 Disorder of the skin and subcutaneous tissue, unspecified: Secondary | ICD-10-CM | POA: Diagnosis not present

## 2021-10-09 DIAGNOSIS — I509 Heart failure, unspecified: Secondary | ICD-10-CM | POA: Diagnosis not present

## 2021-10-09 DIAGNOSIS — M159 Polyosteoarthritis, unspecified: Secondary | ICD-10-CM | POA: Diagnosis not present

## 2021-10-09 DIAGNOSIS — J449 Chronic obstructive pulmonary disease, unspecified: Secondary | ICD-10-CM | POA: Diagnosis not present

## 2021-10-09 DIAGNOSIS — Z79899 Other long term (current) drug therapy: Secondary | ICD-10-CM | POA: Diagnosis not present

## 2021-10-09 DIAGNOSIS — I10 Essential (primary) hypertension: Secondary | ICD-10-CM | POA: Diagnosis not present

## 2021-10-09 DIAGNOSIS — I4891 Unspecified atrial fibrillation: Secondary | ICD-10-CM | POA: Diagnosis not present

## 2021-10-09 DIAGNOSIS — N185 Chronic kidney disease, stage 5: Secondary | ICD-10-CM | POA: Diagnosis not present

## 2021-10-09 DIAGNOSIS — E1165 Type 2 diabetes mellitus with hyperglycemia: Secondary | ICD-10-CM | POA: Diagnosis not present

## 2021-10-09 DIAGNOSIS — C8590 Non-Hodgkin lymphoma, unspecified, unspecified site: Secondary | ICD-10-CM | POA: Diagnosis not present

## 2021-11-01 DIAGNOSIS — L578 Other skin changes due to chronic exposure to nonionizing radiation: Secondary | ICD-10-CM | POA: Diagnosis not present

## 2021-11-01 DIAGNOSIS — C4441 Basal cell carcinoma of skin of scalp and neck: Secondary | ICD-10-CM | POA: Diagnosis not present

## 2021-11-07 DIAGNOSIS — I340 Nonrheumatic mitral (valve) insufficiency: Secondary | ICD-10-CM | POA: Diagnosis not present

## 2021-11-08 DIAGNOSIS — D696 Thrombocytopenia, unspecified: Secondary | ICD-10-CM | POA: Diagnosis not present

## 2021-11-28 ENCOUNTER — Other Ambulatory Visit: Payer: Self-pay | Admitting: Oncology

## 2021-11-28 DIAGNOSIS — D539 Nutritional anemia, unspecified: Secondary | ICD-10-CM
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
## 2021-11-29 ENCOUNTER — Encounter: Payer: Self-pay | Admitting: Oncology

## 2021-11-29 ENCOUNTER — Inpatient Hospital Stay: Payer: Medicare Other | Attending: Oncology | Admitting: Oncology

## 2021-11-29 ENCOUNTER — Other Ambulatory Visit: Payer: Self-pay | Admitting: Oncology

## 2021-11-29 ENCOUNTER — Inpatient Hospital Stay: Payer: Medicare Other

## 2021-11-29 VITALS — BP 157/70 | HR 68 | Temp 97.4°F | Resp 18 | Ht 71.0 in | Wt 222.6 lb

## 2021-11-29 DIAGNOSIS — Z794 Long term (current) use of insulin: Secondary | ICD-10-CM | POA: Insufficient documentation

## 2021-11-29 DIAGNOSIS — Z87891 Personal history of nicotine dependence: Secondary | ICD-10-CM | POA: Diagnosis not present

## 2021-11-29 DIAGNOSIS — Z7982 Long term (current) use of aspirin: Secondary | ICD-10-CM | POA: Diagnosis not present

## 2021-11-29 DIAGNOSIS — N289 Disorder of kidney and ureter, unspecified: Secondary | ICD-10-CM | POA: Diagnosis not present

## 2021-11-29 DIAGNOSIS — D649 Anemia, unspecified: Secondary | ICD-10-CM | POA: Diagnosis not present

## 2021-11-29 DIAGNOSIS — Z79899 Other long term (current) drug therapy: Secondary | ICD-10-CM | POA: Diagnosis not present

## 2021-11-29 DIAGNOSIS — K7469 Other cirrhosis of liver: Secondary | ICD-10-CM

## 2021-11-29 DIAGNOSIS — C884 Extranodal marginal zone B-cell lymphoma of mucosa-associated lymphoid tissue [MALT-lymphoma]: Secondary | ICD-10-CM

## 2021-11-29 DIAGNOSIS — D539 Nutritional anemia, unspecified: Secondary | ICD-10-CM | POA: Diagnosis not present

## 2021-11-29 DIAGNOSIS — D631 Anemia in chronic kidney disease: Secondary | ICD-10-CM

## 2021-11-29 DIAGNOSIS — D696 Thrombocytopenia, unspecified: Secondary | ICD-10-CM | POA: Insufficient documentation

## 2021-11-29 LAB — IRON AND TIBC
Iron: 74 ug/dL (ref 45–182)
Saturation Ratios: 22 % (ref 17.9–39.5)
TIBC: 333 ug/dL (ref 250–450)
UIBC: 259 ug/dL

## 2021-11-29 LAB — HEPATIC FUNCTION PANEL
ALT: 36 U/L (ref 10–40)
AST: 37 (ref 14–40)
Alkaline Phosphatase: 107 (ref 25–125)
Bilirubin, Total: 0.9

## 2021-11-29 LAB — CBC AND DIFFERENTIAL
HCT: 31 — AB (ref 41–53)
Hemoglobin: 10.3 — AB (ref 13.5–17.5)
Neutrophils Absolute: 3.53
Platelets: 77 10*3/uL — AB (ref 150–400)
WBC: 4.7

## 2021-11-29 LAB — COMPREHENSIVE METABOLIC PANEL
Albumin: 4.1 (ref 3.5–5.0)
Calcium: 9 (ref 8.7–10.7)

## 2021-11-29 LAB — FERRITIN: Ferritin: 122 ng/mL (ref 24–336)

## 2021-11-29 LAB — BASIC METABOLIC PANEL
BUN: 63 — AB (ref 4–21)
CO2: 23 — AB (ref 13–22)
Chloride: 113 — AB (ref 99–108)
Creatinine: 4.3 — AB (ref 0.6–1.3)
Glucose: 138
Potassium: 4.2 mEq/L (ref 3.5–5.1)
Sodium: 143 (ref 137–147)

## 2021-11-29 LAB — CBC: RBC: 3.03 — AB (ref 3.87–5.11)

## 2021-11-29 LAB — VITAMIN B12: Vitamin B-12: 246 pg/mL (ref 180–914)

## 2021-11-29 LAB — TSH: TSH: 2.952 u[IU]/mL (ref 0.350–4.500)

## 2021-11-29 LAB — FOLATE: Folate: 12 ng/mL (ref >5.9)

## 2021-12-04 LAB — PROTEIN ELECTROPHORESIS, SERUM
A/G Ratio: 1.1 (ref 0.7–1.7)
Albumin ELP: 3.7 g/dL (ref 2.9–4.4)
Alpha-1-Globulin: 0.3 g/dL (ref 0.0–0.4)
Alpha-2-Globulin: 0.7 g/dL (ref 0.4–1.0)
Beta Globulin: 1 g/dL (ref 0.7–1.3)
Gamma Globulin: 1.5 g/dL (ref 0.4–1.8)
Globulin, Total: 3.5 g/dL (ref 2.2–3.9)
Total Protein ELP: 7.2 g/dL (ref 6.0–8.5)

## 2021-12-12 ENCOUNTER — Encounter: Payer: Self-pay | Admitting: Oncology

## 2021-12-12 DIAGNOSIS — K7469 Other cirrhosis of liver: Secondary | ICD-10-CM | POA: Diagnosis not present

## 2021-12-12 DIAGNOSIS — C8330 Diffuse large B-cell lymphoma, unspecified site: Secondary | ICD-10-CM | POA: Diagnosis not present

## 2021-12-12 DIAGNOSIS — C884 Extranodal marginal zone B-cell lymphoma of mucosa-associated lymphoid tissue [MALT-lymphoma]: Secondary | ICD-10-CM | POA: Diagnosis not present

## 2021-12-12 NOTE — Progress Notes (Signed)
[facility]  ?[address] ?[place],  [place]  [address] ?[phone] ? ?Clinic Day:  12/13/2021 ? ?Referring physician: Helen Hashimoto., MD ? ? ?HISTORY OF PRESENT ILLNESS:  ?The patient is a 61 y.o. male  who I recently began seeing for thrombocytopenia.  Initial labs also showed anemia, with a low hemoglobin of 9.9.  Of note, he has a longstanding history of chronic renal insufficiency.  In fact, the patient was receiving peritoneal dialysis up until a year and a half ago.  He comes in today to go over all of his recent labs and CT scans.  His CT scans were ordered due to there being a history of cirrhosis, which may be a contributory factor to his thrombocytopenia.  Since his last visit, the patient has been doing fairly well.  Despite his anemia he denies having increased fatigue.  Despite his thrombocytopenia he denies having any subcutaneous bleeding/bruising issues. ? ?PHYSICAL EXAM:  ?Blood pressure 135/63, pulse (!) 58, temperature 97.6 ?F (36.4 ?C), resp. rate 16, height 5\' 11"  (1.803 m), weight 224 lb 3.2 oz (101.7 kg), SpO2 99 %. ?Wt Readings from Last 3 Encounters:  ?12/13/21 224 lb 3.2 oz (101.7 kg)  ?11/29/21 222 lb 9.6 oz (101 kg)  ?03/26/20 (!) 213 lb 6.5 oz (96.8 kg)  ? ?Body mass index is 31.27 kg/m?Marland Kitchen ?Performance status (ECOG): 1 - Symptomatic but completely ambulatory ?Physical Exam ?Constitutional:   ?   Appearance: Normal appearance. He is not ill-appearing.  ?HENT:  ?   Mouth/Throat:  ?   Mouth: Mucous membranes are moist.  ?   Pharynx: Oropharynx is clear. No oropharyngeal exudate or posterior oropharyngeal erythema.  ?Cardiovascular:  ?   Rate and Rhythm: Normal rate and regular rhythm.  ?   Heart sounds: No murmur heard. ?  No friction rub. No gallop.  ?Pulmonary:  ?   Effort: Pulmonary effort is normal. No respiratory distress.  ?   Breath sounds: Normal breath sounds. No wheezing, rhonchi or rales.  ?Abdominal:  ?   General: Bowel sounds  are normal. There is distension.  ?   Palpations: Abdomen is soft. There is no mass.  ?   Tenderness: There is no abdominal tenderness.  ?Musculoskeletal:     ?   General: No swelling.  ?   Right lower leg: No edema.  ?   Left lower leg: No edema.  ?Lymphadenopathy:  ?   Cervical: No cervical adenopathy.  ?   Upper Body:  ?   Right upper body: No supraclavicular or axillary adenopathy.  ?   Left upper body: No supraclavicular or axillary adenopathy.  ?   Lower Body: No right inguinal adenopathy. No left inguinal adenopathy.  ?Skin: ?   General: Skin is warm.  ?   Coloration: Skin is not jaundiced.  ?   Findings: No lesion or rash.  ?Neurological:  ?   General: No focal deficit present.  ?   Mental Status: He is alert and oriented to person, place, and time. Mental status is at baseline.  ?Psychiatric:     ?   Mood and Affect: Mood normal.     ?   Behavior: Behavior normal.     ?   Thought Content: Thought content normal.  ? ?LABS:  ? ? ?  Latest Ref Rng & Units 11/29/2021  ? 12:00 AM 03/26/2020  ?  8:36 AM 03/25/2020  ?  4:04 AM  ?CBC  ?  WBC  4.7      7.1   8.6    ?Hemoglobin 13.5 - 17.5 10.3      12.1   10.1    ?Hematocrit 41 - 53 31      33.8   27.9    ?Platelets 150 - 400 K/uL 77      137   150    ?  ? This result is from an external source.  ? ? Latest Reference Range & Units Most Recent  ?Iron 45 - 182 ug/dL 74 ?11/29/21 10:38  ?UIBC ug/dL 259 ?11/29/21 10:38  ?TIBC 250 - 450 ug/dL 333 ?11/29/21 10:38  ?Saturation Ratios 17.9 - 39.5 % 22 ?11/29/21 10:38  ?Ferritin 24 - 336 ng/mL 122 ?11/29/21 10:38  ?Folate >5.9 ng/mL 12.0 ?11/29/21 10:38  ?Vitamin B12 180 - 914 pg/mL 246 ?11/29/21 10:38  ? ? Latest Reference Range & Units 11/29/21 00:00  ?Sodium 137 - 147  143 (E)  ?Potassium 3.5 - 5.1 mEq/L 4.2 (E)  ?Chloride 99 - 108  113 ! (E)  ?CO2 13 - 22  23 ! (E)  ?Glucose  138 (E)  ?BUN 4 - 21  63 ! (E)  ?Creatinine 0.6 - 1.3  4.3 ! (E)  ?Calcium 8.7 - 10.7  9.0 (E)  ?!: Data is abnormal ?(E): External lab result ? ?ASSESSMENT &  PLAN:  ?A 61 y.o. male who I was asked to consult upon for thrombocytopenia and anemia.  In clinic today, I went over his CT scans with him, for which he could see his significant cirrhosis and secondary splenomegaly.  He understands these findings are likely the reason behind his thrombocytopenia.  Furthermore, his scans show how significantly diseased his bilateral kidneys are.  He understands this is the major reason why he is anemic.  Of note, recent labs did not show any nutritional deficiencies factoring into any of his cytopenias.  The patient knows it will be important to keep his diabetes and blood pressure under tight control to prevent him from going back into end-stage renal disease.  He also knows it will be imperative to keep his weight under control to prevent worsening liver disease/cirrhosis over time.  As he is clinically doing okay, I will see him back in 6 months for repeat clinical assessment.  The patient understands all the plans discussed today and is in agreement with them. ? ?Gisela Lea Macarthur Critchley, MD   ? ? ?  ?

## 2021-12-13 ENCOUNTER — Inpatient Hospital Stay: Payer: Medicare Other | Attending: Oncology | Admitting: Oncology

## 2021-12-13 ENCOUNTER — Other Ambulatory Visit: Payer: Self-pay | Admitting: Oncology

## 2021-12-13 VITALS — BP 135/63 | HR 58 | Temp 97.6°F | Resp 16 | Ht 71.0 in | Wt 224.2 lb

## 2021-12-13 DIAGNOSIS — D631 Anemia in chronic kidney disease: Secondary | ICD-10-CM

## 2021-12-13 DIAGNOSIS — D696 Thrombocytopenia, unspecified: Secondary | ICD-10-CM

## 2021-12-13 DIAGNOSIS — N189 Chronic kidney disease, unspecified: Secondary | ICD-10-CM

## 2021-12-20 DIAGNOSIS — I25118 Atherosclerotic heart disease of native coronary artery with other forms of angina pectoris: Secondary | ICD-10-CM | POA: Diagnosis not present

## 2021-12-20 DIAGNOSIS — Z992 Dependence on renal dialysis: Secondary | ICD-10-CM | POA: Diagnosis not present

## 2021-12-20 DIAGNOSIS — Z794 Long term (current) use of insulin: Secondary | ICD-10-CM | POA: Diagnosis not present

## 2021-12-20 DIAGNOSIS — E7800 Pure hypercholesterolemia, unspecified: Secondary | ICD-10-CM | POA: Diagnosis not present

## 2021-12-20 DIAGNOSIS — I5022 Chronic systolic (congestive) heart failure: Secondary | ICD-10-CM | POA: Diagnosis not present

## 2021-12-20 DIAGNOSIS — N186 End stage renal disease: Secondary | ICD-10-CM | POA: Diagnosis not present

## 2021-12-20 DIAGNOSIS — E1122 Type 2 diabetes mellitus with diabetic chronic kidney disease: Secondary | ICD-10-CM | POA: Diagnosis not present

## 2021-12-20 DIAGNOSIS — Z8679 Personal history of other diseases of the circulatory system: Secondary | ICD-10-CM | POA: Diagnosis not present

## 2021-12-20 DIAGNOSIS — I951 Orthostatic hypotension: Secondary | ICD-10-CM | POA: Diagnosis not present

## 2021-12-20 DIAGNOSIS — I110 Hypertensive heart disease with heart failure: Secondary | ICD-10-CM | POA: Diagnosis not present

## 2021-12-20 DIAGNOSIS — I480 Paroxysmal atrial fibrillation: Secondary | ICD-10-CM | POA: Diagnosis not present

## 2021-12-27 DIAGNOSIS — J449 Chronic obstructive pulmonary disease, unspecified: Secondary | ICD-10-CM | POA: Diagnosis not present

## 2021-12-27 DIAGNOSIS — N185 Chronic kidney disease, stage 5: Secondary | ICD-10-CM | POA: Diagnosis not present

## 2021-12-27 DIAGNOSIS — I10 Essential (primary) hypertension: Secondary | ICD-10-CM | POA: Diagnosis not present

## 2021-12-27 DIAGNOSIS — M159 Polyosteoarthritis, unspecified: Secondary | ICD-10-CM | POA: Diagnosis not present

## 2021-12-27 DIAGNOSIS — D631 Anemia in chronic kidney disease: Secondary | ICD-10-CM | POA: Diagnosis not present

## 2021-12-27 DIAGNOSIS — I509 Heart failure, unspecified: Secondary | ICD-10-CM | POA: Diagnosis not present

## 2021-12-27 DIAGNOSIS — I4891 Unspecified atrial fibrillation: Secondary | ICD-10-CM | POA: Diagnosis not present

## 2021-12-27 DIAGNOSIS — G894 Chronic pain syndrome: Secondary | ICD-10-CM | POA: Diagnosis not present

## 2021-12-27 DIAGNOSIS — E1165 Type 2 diabetes mellitus with hyperglycemia: Secondary | ICD-10-CM | POA: Diagnosis not present

## 2022-01-15 DIAGNOSIS — I208 Other forms of angina pectoris: Secondary | ICD-10-CM | POA: Diagnosis not present

## 2022-01-15 DIAGNOSIS — I129 Hypertensive chronic kidney disease with stage 1 through stage 4 chronic kidney disease, or unspecified chronic kidney disease: Secondary | ICD-10-CM | POA: Diagnosis not present

## 2022-01-15 DIAGNOSIS — N2581 Secondary hyperparathyroidism of renal origin: Secondary | ICD-10-CM | POA: Diagnosis not present

## 2022-01-15 DIAGNOSIS — N184 Chronic kidney disease, stage 4 (severe): Secondary | ICD-10-CM | POA: Diagnosis not present

## 2022-01-15 DIAGNOSIS — I509 Heart failure, unspecified: Secondary | ICD-10-CM | POA: Diagnosis not present

## 2022-01-15 DIAGNOSIS — E1122 Type 2 diabetes mellitus with diabetic chronic kidney disease: Secondary | ICD-10-CM | POA: Diagnosis not present

## 2022-01-17 DIAGNOSIS — K7460 Unspecified cirrhosis of liver: Secondary | ICD-10-CM | POA: Diagnosis not present

## 2022-01-17 DIAGNOSIS — E559 Vitamin D deficiency, unspecified: Secondary | ICD-10-CM | POA: Diagnosis not present

## 2022-01-17 DIAGNOSIS — I10 Essential (primary) hypertension: Secondary | ICD-10-CM | POA: Diagnosis not present

## 2022-01-17 DIAGNOSIS — E118 Type 2 diabetes mellitus with unspecified complications: Secondary | ICD-10-CM | POA: Diagnosis not present

## 2022-01-17 DIAGNOSIS — E1140 Type 2 diabetes mellitus with diabetic neuropathy, unspecified: Secondary | ICD-10-CM | POA: Diagnosis not present

## 2022-01-17 DIAGNOSIS — D631 Anemia in chronic kidney disease: Secondary | ICD-10-CM | POA: Diagnosis not present

## 2022-01-17 DIAGNOSIS — I25118 Atherosclerotic heart disease of native coronary artery with other forms of angina pectoris: Secondary | ICD-10-CM | POA: Diagnosis not present

## 2022-01-17 DIAGNOSIS — E785 Hyperlipidemia, unspecified: Secondary | ICD-10-CM | POA: Diagnosis not present

## 2022-01-17 DIAGNOSIS — N2581 Secondary hyperparathyroidism of renal origin: Secondary | ICD-10-CM | POA: Diagnosis not present

## 2022-01-17 DIAGNOSIS — I5022 Chronic systolic (congestive) heart failure: Secondary | ICD-10-CM | POA: Diagnosis not present

## 2022-01-17 DIAGNOSIS — C8590 Non-Hodgkin lymphoma, unspecified, unspecified site: Secondary | ICD-10-CM | POA: Diagnosis not present

## 2022-01-17 DIAGNOSIS — R809 Proteinuria, unspecified: Secondary | ICD-10-CM | POA: Diagnosis not present

## 2022-01-21 DIAGNOSIS — M79671 Pain in right foot: Secondary | ICD-10-CM | POA: Diagnosis not present

## 2022-02-05 DIAGNOSIS — M542 Cervicalgia: Secondary | ICD-10-CM | POA: Diagnosis not present

## 2022-02-05 DIAGNOSIS — D696 Thrombocytopenia, unspecified: Secondary | ICD-10-CM | POA: Diagnosis not present

## 2022-02-05 DIAGNOSIS — N184 Chronic kidney disease, stage 4 (severe): Secondary | ICD-10-CM | POA: Diagnosis not present

## 2022-02-05 DIAGNOSIS — M5442 Lumbago with sciatica, left side: Secondary | ICD-10-CM | POA: Diagnosis not present

## 2022-02-05 DIAGNOSIS — M5441 Lumbago with sciatica, right side: Secondary | ICD-10-CM | POA: Diagnosis not present

## 2022-02-05 DIAGNOSIS — G8929 Other chronic pain: Secondary | ICD-10-CM | POA: Diagnosis not present

## 2022-02-05 DIAGNOSIS — Z9889 Other specified postprocedural states: Secondary | ICD-10-CM | POA: Diagnosis not present

## 2022-02-15 ENCOUNTER — Encounter: Payer: Self-pay | Admitting: Oncology

## 2022-02-18 ENCOUNTER — Other Ambulatory Visit: Payer: Self-pay

## 2022-02-18 ENCOUNTER — Inpatient Hospital Stay: Payer: Medicare Other | Attending: Oncology

## 2022-02-18 DIAGNOSIS — D631 Anemia in chronic kidney disease: Secondary | ICD-10-CM | POA: Diagnosis not present

## 2022-02-18 DIAGNOSIS — D696 Thrombocytopenia, unspecified: Secondary | ICD-10-CM | POA: Insufficient documentation

## 2022-02-18 DIAGNOSIS — D649 Anemia, unspecified: Secondary | ICD-10-CM | POA: Insufficient documentation

## 2022-02-18 DIAGNOSIS — D539 Nutritional anemia, unspecified: Secondary | ICD-10-CM

## 2022-02-18 LAB — HEPATIC FUNCTION PANEL
ALT: 36 U/L (ref 10–40)
AST: 38 (ref 14–40)
Alkaline Phosphatase: 87 (ref 25–125)
Bilirubin, Total: 0.9

## 2022-02-18 LAB — BASIC METABOLIC PANEL
BUN: 78 — AB (ref 4–21)
CO2: 19 (ref 13–22)
Chloride: 111 — AB (ref 99–108)
Creatinine: 4.1 — AB (ref 0.6–1.3)
Glucose: 126
Potassium: 4.1 mEq/L (ref 3.5–5.1)
Sodium: 145 (ref 137–147)

## 2022-02-18 LAB — CBC AND DIFFERENTIAL
HCT: 28 — AB (ref 41–53)
Hemoglobin: 9.8 — AB (ref 13.5–17.5)
Neutrophils Absolute: 2.79
Platelets: 72 10*3/uL — AB (ref 150–400)
WBC: 4.1

## 2022-02-18 LAB — COMPREHENSIVE METABOLIC PANEL
Albumin: 4 (ref 3.5–5.0)
Calcium: 9.1 (ref 8.7–10.7)

## 2022-02-18 LAB — TSH: TSH: 2.358 u[IU]/mL (ref 0.350–4.500)

## 2022-02-18 LAB — CBC: RBC: 2.77 — AB (ref 3.87–5.11)

## 2022-03-07 NOTE — Progress Notes (Signed)
[facility]  [address] [place],  [place]  [address] [phone]  Clinic Day:  03/08/2022  Referring physician: Helen Hashimoto., MD   HISTORY OF PRESENT ILLNESS:  The patient is a 61 y.o. male  who I recently began seeing for thrombocytopenia.  Recent scans showed him to have cirrhosis and secondary splenomegaly as being the reasons behind his thrombocytopenia.  Of note, this gentleman also has anemia secondary to chronic renal insufficiency.  He comes in today for routine follow-up.  Since his last visit, the patient has been doing fairly well.  He denies having any new bleeding/bruising issues which concern him for worsening thrombocytopenia.  Although he has a mild degree of fatigue, he denies having any forms of blood loss which concern him for having worsening anemia.   PHYSICAL EXAM:  Blood pressure 135/64, pulse 63, temperature 97.6 F (36.4 C), resp. rate 16, height 5\' 11"  (1.803 m), weight 217 lb 12.8 oz (98.8 kg), SpO2 98 %. Wt Readings from Last 3 Encounters:  03/08/22 217 lb 12.8 oz (98.8 kg)  12/13/21 224 lb 3.2 oz (101.7 kg)  11/29/21 222 lb 9.6 oz (101 kg)   Body mass index is 30.38 kg/m. Performance status (ECOG): 1 - Symptomatic but completely ambulatory Physical Exam Constitutional:      Appearance: Normal appearance. He is not ill-appearing.  HENT:     Mouth/Throat:     Mouth: Mucous membranes are moist.     Pharynx: Oropharynx is clear. No oropharyngeal exudate or posterior oropharyngeal erythema.  Cardiovascular:     Rate and Rhythm: Normal rate and regular rhythm.     Heart sounds: No murmur heard.    No friction rub. No gallop.  Pulmonary:     Effort: Pulmonary effort is normal. No respiratory distress.     Breath sounds: Normal breath sounds. No wheezing, rhonchi or rales.  Abdominal:     General: Bowel sounds are normal. There is distension.     Palpations: Abdomen is soft. There is no mass.     Tenderness:  There is no abdominal tenderness.  Musculoskeletal:        General: No swelling.     Right lower leg: No edema.     Left lower leg: No edema.  Lymphadenopathy:     Cervical: No cervical adenopathy.     Upper Body:     Right upper body: No supraclavicular or axillary adenopathy.     Left upper body: No supraclavicular or axillary adenopathy.     Lower Body: No right inguinal adenopathy. No left inguinal adenopathy.  Skin:    General: Skin is warm.     Coloration: Skin is not jaundiced.     Findings: No lesion or rash.  Neurological:     General: No focal deficit present.     Mental Status: He is alert and oriented to person, place, and time. Mental status is at baseline.  Psychiatric:        Mood and Affect: Mood normal.        Behavior: Behavior normal.        Thought Content: Thought content normal.    LABS:      Latest Ref Rng & Units 03/08/2022   12:00 AM 02/18/2022   12:00 AM 11/29/2021   12:00 AM  CBC  WBC  4.1     4.1     4.7      Hemoglobin 13.5 - 17.5 9.8  9.8     10.3      Hematocrit 41 - 53 29     28     31       Platelets 150 - 400 K/uL 68     72     77         This result is from an external source.    Latest Reference Range & Units 03/08/22 11:10  Iron 45 - 182 ug/dL 77  UIBC ug/dL 252  TIBC 250 - 450 ug/dL 329  Saturation Ratios 17.9 - 39.5 % 23  Ferritin 24 - 336 ng/mL 72  Vitamin B12 180 - 914 pg/mL 303   ASSESSMENT & PLAN:  A 61 y.o. male with anemia secondary to chronic renal insufficiency.  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  In clinic today, I had a very long discussion with the patient as I do not believe he grasps the seriousness of his multiorgan compromise.  Based upon his levels today, this gentleman is on the precipice of needing dialysis again.  His labs today do not show any type of nutritional deficiencies factoring into his anemia.  Based upon this, I will get Retacrit shots initiated to where he will receive a  20,000-unit shot once per month.  The goal is to keep his hemoglobin at or above 10.  As it pertains to his thrombocytopenia, his platelet count of 68 is not severely low.  This will continue to be followed conservatively.  However, I did explain to the patient that his cirrhosis is irreversible.  Moving forward, he needs to do whatever necessary to prevent his liver function from getting even worse over time.  That includes keeping his diabetes and weight under much better control.  I will see him back in 3 months for repeat clinical assessment the patient understands all the plans discussed today and is in agreement with them.  Gaither Biehn Macarthur Critchley, MD

## 2022-03-08 ENCOUNTER — Other Ambulatory Visit: Payer: Self-pay | Admitting: Oncology

## 2022-03-08 ENCOUNTER — Inpatient Hospital Stay: Payer: Medicare Other | Admitting: Oncology

## 2022-03-08 ENCOUNTER — Other Ambulatory Visit: Payer: Self-pay | Admitting: Hematology and Oncology

## 2022-03-08 ENCOUNTER — Inpatient Hospital Stay: Payer: Medicare Other | Attending: Oncology

## 2022-03-08 VITALS — BP 135/64 | HR 63 | Temp 97.6°F | Resp 16 | Ht 71.0 in | Wt 217.8 lb

## 2022-03-08 DIAGNOSIS — E1122 Type 2 diabetes mellitus with diabetic chronic kidney disease: Secondary | ICD-10-CM | POA: Insufficient documentation

## 2022-03-08 DIAGNOSIS — K7460 Unspecified cirrhosis of liver: Secondary | ICD-10-CM | POA: Diagnosis not present

## 2022-03-08 DIAGNOSIS — R161 Splenomegaly, not elsewhere classified: Secondary | ICD-10-CM | POA: Diagnosis not present

## 2022-03-08 DIAGNOSIS — D696 Thrombocytopenia, unspecified: Secondary | ICD-10-CM | POA: Insufficient documentation

## 2022-03-08 DIAGNOSIS — R5383 Other fatigue: Secondary | ICD-10-CM | POA: Insufficient documentation

## 2022-03-08 DIAGNOSIS — N189 Chronic kidney disease, unspecified: Secondary | ICD-10-CM | POA: Diagnosis not present

## 2022-03-08 DIAGNOSIS — D539 Nutritional anemia, unspecified: Secondary | ICD-10-CM

## 2022-03-08 DIAGNOSIS — D631 Anemia in chronic kidney disease: Secondary | ICD-10-CM | POA: Diagnosis not present

## 2022-03-08 LAB — CBC AND DIFFERENTIAL
HCT: 29 — AB (ref 41–53)
Hemoglobin: 9.8 — AB (ref 13.5–17.5)
Neutrophils Absolute: 2.99
Platelets: 68 10*3/uL — AB (ref 150–400)
WBC: 4.1

## 2022-03-08 LAB — TSH: TSH: 2.418 u[IU]/mL (ref 0.350–4.500)

## 2022-03-08 LAB — IRON AND TIBC
Iron: 77 ug/dL (ref 45–182)
Saturation Ratios: 23 % (ref 17.9–39.5)
TIBC: 329 ug/dL (ref 250–450)
UIBC: 252 ug/dL

## 2022-03-08 LAB — FERRITIN: Ferritin: 72 ng/mL (ref 24–336)

## 2022-03-08 LAB — VITAMIN B12: Vitamin B-12: 303 pg/mL (ref 180–914)

## 2022-03-08 LAB — FOLATE: Folate: 11.1 ng/mL (ref >5.9)

## 2022-03-08 LAB — CBC: RBC: 2.82 — AB (ref 3.87–5.11)

## 2022-03-08 NOTE — Progress Notes (Signed)
Patient is c/o abdominal distention, increased fatigue, headaches, and nosebleeds.

## 2022-03-13 ENCOUNTER — Encounter: Payer: Self-pay | Admitting: Oncology

## 2022-03-13 ENCOUNTER — Inpatient Hospital Stay: Payer: Medicare Other

## 2022-03-13 VITALS — BP 126/56 | HR 63 | Temp 98.0°F | Resp 18 | Ht 71.0 in | Wt 216.0 lb

## 2022-03-13 DIAGNOSIS — R161 Splenomegaly, not elsewhere classified: Secondary | ICD-10-CM | POA: Diagnosis not present

## 2022-03-13 DIAGNOSIS — R5383 Other fatigue: Secondary | ICD-10-CM | POA: Diagnosis not present

## 2022-03-13 DIAGNOSIS — D631 Anemia in chronic kidney disease: Secondary | ICD-10-CM | POA: Diagnosis not present

## 2022-03-13 DIAGNOSIS — K7460 Unspecified cirrhosis of liver: Secondary | ICD-10-CM | POA: Diagnosis not present

## 2022-03-13 DIAGNOSIS — N189 Chronic kidney disease, unspecified: Secondary | ICD-10-CM | POA: Diagnosis not present

## 2022-03-13 DIAGNOSIS — D696 Thrombocytopenia, unspecified: Secondary | ICD-10-CM | POA: Diagnosis not present

## 2022-03-13 DIAGNOSIS — E1122 Type 2 diabetes mellitus with diabetic chronic kidney disease: Secondary | ICD-10-CM | POA: Diagnosis not present

## 2022-03-13 MED ORDER — EPOETIN ALFA-EPBX 20000 UNIT/ML IJ SOLN
20000.0000 [IU] | Freq: Once | INTRAMUSCULAR | Status: AC
  Administered 2022-03-13: 20000 [IU] via SUBCUTANEOUS
  Filled 2022-03-13: qty 1

## 2022-03-13 NOTE — Addendum Note (Signed)
Addended by: Juanetta Beets on: 03/13/2022 11:21 AM   Modules accepted: Orders

## 2022-03-13 NOTE — Patient Instructions (Signed)
Epoetin Alfa injection ?What is this medication? ?EPOETIN ALFA (e POE e tin AL fa) helps your body make more red blood cells. This medicine is used to treat anemia caused by chronic kidney disease, cancer chemotherapy, or HIV-therapy. It may also be used before surgery if you have anemia. ?This medicine may be used for other purposes; ask your health care provider or pharmacist if you have questions. ?COMMON BRAND NAME(S): Epogen, Procrit, Retacrit ?What should I tell my care team before I take this medication? ?They need to know if you have any of these conditions: ?cancer ?heart disease ?high blood pressure ?history of blood clots ?history of stroke ?low levels of folate, iron, or vitamin B12 in the blood ?seizures ?an unusual or allergic reaction to erythropoietin, albumin, benzyl alcohol, hamster proteins, other medicines, foods, dyes, or preservatives ?pregnant or trying to get pregnant ?breast-feeding ?How should I use this medication? ?This medicine is for injection into a vein or under the skin. It is usually given by a health care professional in a hospital or clinic setting. ?If you get this medicine at home, you will be taught how to prepare and give this medicine. Use exactly as directed. Take your medicine at regular intervals. Do not take your medicine more often than directed. ?It is important that you put your used needles and syringes in a special sharps container. Do not put them in a trash can. If you do not have a sharps container, call your pharmacist or healthcare provider to get one. ?A special MedGuide will be given to you by the pharmacist with each prescription and refill. Be sure to read this information carefully each time. ?Talk to your pediatrician regarding the use of this medicine in children. While this drug may be prescribed for selected conditions, precautions do apply. ?Overdosage: If you think you have taken too much of this medicine contact a poison control center or emergency  room at once. ?NOTE: This medicine is only for you. Do not share this medicine with others. ?What if I miss a dose? ?If you miss a dose, take it as soon as you can. If it is almost time for your next dose, take only that dose. Do not take double or extra doses. ?What may interact with this medication? ?Interactions have not been studied. ?This list may not describe all possible interactions. Give your health care provider a list of all the medicines, herbs, non-prescription drugs, or dietary supplements you use. Also tell them if you smoke, drink alcohol, or use illegal drugs. Some items may interact with your medicine. ?What should I watch for while using this medication? ?Your condition will be monitored carefully while you are receiving this medicine. ?You may need blood work done while you are taking this medicine. ?This medicine may cause a decrease in vitamin B6. You should make sure that you get enough vitamin B6 while you are taking this medicine. Discuss the foods you eat and the vitamins you take with your health care professional. ?What side effects may I notice from receiving this medication? ?Side effects that you should report to your doctor or health care professional as soon as possible: ?allergic reactions like skin rash, itching or hives, swelling of the face, lips, or tongue ?seizures ?signs and symptoms of a blood clot such as breathing problems; changes in vision; chest pain; severe, sudden headache; pain, swelling, warmth in the leg; trouble speaking; sudden numbness or weakness of the face, arm or leg ?signs and symptoms of a stroke like   changes in vision; confusion; trouble speaking or understanding; severe headaches; sudden numbness or weakness of the face, arm or leg; trouble walking; dizziness; loss of balance or coordination ?Side effects that usually do not require medical attention (report to your doctor or health care professional if they continue or are  bothersome): ?chills ?cough ?dizziness ?fever ?headaches ?joint pain ?muscle cramps ?muscle pain ?nausea, vomiting ?pain, redness, or irritation at site where injected ?This list may not describe all possible side effects. Call your doctor for medical advice about side effects. You may report side effects to FDA at 1-800-FDA-1088. ?Where should I keep my medication? ?Keep out of the reach of children. ?Store in a refrigerator between 2 and 8 degrees C (36 and 46 degrees F). Do not freeze or shake. Throw away any unused portion if using a single-dose vial. Multi-dose vials can be kept in the refrigerator for up to 21 days after the initial dose. Throw away unused medicine. ?NOTE: This sheet is a summary. It may not cover all possible information. If you have questions about this medicine, talk to your doctor, pharmacist, or health care provider. ?? 2023 Elsevier/Gold Standard (2017-04-22 00:00:00) ? ?

## 2022-03-19 DIAGNOSIS — G8929 Other chronic pain: Secondary | ICD-10-CM | POA: Diagnosis not present

## 2022-03-19 DIAGNOSIS — M47812 Spondylosis without myelopathy or radiculopathy, cervical region: Secondary | ICD-10-CM | POA: Diagnosis not present

## 2022-03-19 DIAGNOSIS — M5442 Lumbago with sciatica, left side: Secondary | ICD-10-CM | POA: Diagnosis not present

## 2022-03-19 DIAGNOSIS — Z9889 Other specified postprocedural states: Secondary | ICD-10-CM | POA: Diagnosis not present

## 2022-03-19 DIAGNOSIS — Z79891 Long term (current) use of opiate analgesic: Secondary | ICD-10-CM | POA: Diagnosis not present

## 2022-03-19 DIAGNOSIS — M542 Cervicalgia: Secondary | ICD-10-CM | POA: Diagnosis not present

## 2022-03-19 DIAGNOSIS — M5441 Lumbago with sciatica, right side: Secondary | ICD-10-CM | POA: Diagnosis not present

## 2022-04-02 ENCOUNTER — Telehealth: Payer: Self-pay | Admitting: *Deleted

## 2022-04-02 NOTE — Patient Outreach (Signed)
  Care Coordination   Initial Visit Note   04/02/2022 Name: Curtis Mays MRN: [id number] DOB: September 24, 1960  Curtis Mays is a 61 y.o. year old male who sees Helen Hashimoto., MD for primary care. I spoke with  [name] by phone today  What matters to the patients health and wellness today?  declines   Goals Addressed   None     SDOH assessments and interventions completed:   No   Care Coordination Interventions Activated:  No Care Coordination Interventions:  No, not indicated  Follow up plan: No further intervention required.  Encounter Outcome:  Pt. Refused

## 2022-04-09 ENCOUNTER — Encounter: Payer: Self-pay | Admitting: Oncology

## 2022-04-10 ENCOUNTER — Inpatient Hospital Stay: Payer: Medicare Other

## 2022-04-10 ENCOUNTER — Inpatient Hospital Stay: Payer: Medicare Other | Attending: Oncology

## 2022-04-10 VITALS — BP 122/74 | HR 67 | Temp 98.2°F | Resp 18 | Ht 71.0 in | Wt 216.0 lb

## 2022-04-10 DIAGNOSIS — N189 Chronic kidney disease, unspecified: Secondary | ICD-10-CM

## 2022-04-10 DIAGNOSIS — D539 Nutritional anemia, unspecified: Secondary | ICD-10-CM

## 2022-04-10 DIAGNOSIS — Z79899 Other long term (current) drug therapy: Secondary | ICD-10-CM | POA: Insufficient documentation

## 2022-04-10 DIAGNOSIS — D631 Anemia in chronic kidney disease: Secondary | ICD-10-CM | POA: Insufficient documentation

## 2022-04-10 DIAGNOSIS — D649 Anemia, unspecified: Secondary | ICD-10-CM | POA: Diagnosis not present

## 2022-04-10 DIAGNOSIS — N184 Chronic kidney disease, stage 4 (severe): Secondary | ICD-10-CM | POA: Diagnosis not present

## 2022-04-10 DIAGNOSIS — D696 Thrombocytopenia, unspecified: Secondary | ICD-10-CM | POA: Insufficient documentation

## 2022-04-10 LAB — BASIC METABOLIC PANEL
BUN: 86 — AB (ref 4–21)
CO2: 18 (ref 13–22)
Chloride: 111 — AB (ref 99–108)
Creatinine: 4.6 — AB (ref 0.6–1.3)
Glucose: 163
Potassium: 4.4 mEq/L (ref 3.5–5.1)
Sodium: 142 (ref 137–147)

## 2022-04-10 LAB — CBC AND DIFFERENTIAL
HCT: 29 — AB (ref 41–53)
Hemoglobin: 9.6 — AB (ref 13.5–17.5)
Neutrophils Absolute: 2.7
Platelets: 65 10*3/uL — AB (ref 150–400)
WBC: 3.7

## 2022-04-10 LAB — VITAMIN B12: Vitamin B-12: 279 pg/mL (ref 180–914)

## 2022-04-10 LAB — FOLATE: Folate: 9.5 ng/mL (ref >5.9)

## 2022-04-10 LAB — COMPREHENSIVE METABOLIC PANEL
Albumin: 3.8 (ref 3.5–5.0)
Calcium: 9.2 (ref 8.7–10.7)

## 2022-04-10 LAB — FERRITIN: Ferritin: 91 ng/mL (ref 24–336)

## 2022-04-10 LAB — IRON AND TIBC
Iron: 70 ug/dL (ref 45–182)
Saturation Ratios: 21 % (ref 17.9–39.5)
TIBC: 329 ug/dL (ref 250–450)
UIBC: 259 ug/dL

## 2022-04-10 LAB — HEPATIC FUNCTION PANEL
ALT: 32 U/L (ref 10–40)
AST: 38 (ref 14–40)
Alkaline Phosphatase: 100 (ref 25–125)
Bilirubin, Total: 0.9

## 2022-04-10 LAB — TSH: TSH: 2.781 u[IU]/mL (ref 0.350–4.500)

## 2022-04-10 LAB — CBC: RBC: 2.79 — AB (ref 3.87–5.11)

## 2022-04-10 MED ORDER — EPOETIN ALFA-EPBX 20000 UNIT/ML IJ SOLN
20000.0000 [IU] | Freq: Once | INTRAMUSCULAR | Status: AC
  Administered 2022-04-10: 20000 [IU] via SUBCUTANEOUS
  Filled 2022-04-10: qty 1

## 2022-04-10 NOTE — Patient Instructions (Signed)

## 2022-05-01 DIAGNOSIS — K7460 Unspecified cirrhosis of liver: Secondary | ICD-10-CM | POA: Diagnosis not present

## 2022-05-01 DIAGNOSIS — I509 Heart failure, unspecified: Secondary | ICD-10-CM | POA: Diagnosis not present

## 2022-05-01 DIAGNOSIS — M159 Polyosteoarthritis, unspecified: Secondary | ICD-10-CM | POA: Diagnosis not present

## 2022-05-01 DIAGNOSIS — I4891 Unspecified atrial fibrillation: Secondary | ICD-10-CM | POA: Diagnosis not present

## 2022-05-01 DIAGNOSIS — E1165 Type 2 diabetes mellitus with hyperglycemia: Secondary | ICD-10-CM | POA: Diagnosis not present

## 2022-05-01 DIAGNOSIS — C8590 Non-Hodgkin lymphoma, unspecified, unspecified site: Secondary | ICD-10-CM | POA: Diagnosis not present

## 2022-05-01 DIAGNOSIS — N185 Chronic kidney disease, stage 5: Secondary | ICD-10-CM | POA: Diagnosis not present

## 2022-05-01 DIAGNOSIS — I10 Essential (primary) hypertension: Secondary | ICD-10-CM | POA: Diagnosis not present

## 2022-05-01 DIAGNOSIS — J449 Chronic obstructive pulmonary disease, unspecified: Secondary | ICD-10-CM | POA: Diagnosis not present

## 2022-05-05 ENCOUNTER — Encounter (HOSPITAL_COMMUNITY): Payer: Self-pay | Admitting: Emergency Medicine

## 2022-05-05 ENCOUNTER — Other Ambulatory Visit: Payer: Self-pay

## 2022-05-05 ENCOUNTER — Inpatient Hospital Stay (HOSPITAL_COMMUNITY)
Admission: EM | Admit: 2022-05-05 | Discharge: 2022-05-08 | DRG: 682 | Disposition: A | Discharge status: Home / Self Care | Payer: Medicare Other | Attending: Internal Medicine | Admitting: Internal Medicine

## 2022-05-05 ENCOUNTER — Emergency Department (HOSPITAL_COMMUNITY): Payer: Medicare Other

## 2022-05-05 DIAGNOSIS — N19 Unspecified kidney failure: Secondary | ICD-10-CM | POA: Diagnosis present

## 2022-05-05 DIAGNOSIS — G894 Chronic pain syndrome: Secondary | ICD-10-CM | POA: Diagnosis not present

## 2022-05-05 DIAGNOSIS — Z87891 Personal history of nicotine dependence: Secondary | ICD-10-CM

## 2022-05-05 DIAGNOSIS — R161 Splenomegaly, not elsewhere classified: Secondary | ICD-10-CM | POA: Diagnosis present

## 2022-05-05 DIAGNOSIS — K7460 Unspecified cirrhosis of liver: Secondary | ICD-10-CM | POA: Diagnosis not present

## 2022-05-05 DIAGNOSIS — E8779 Other fluid overload: Secondary | ICD-10-CM | POA: Diagnosis not present

## 2022-05-05 DIAGNOSIS — I5021 Acute systolic (congestive) heart failure: Secondary | ICD-10-CM | POA: Diagnosis not present

## 2022-05-05 DIAGNOSIS — I2583 Coronary atherosclerosis due to lipid rich plaque: Secondary | ICD-10-CM | POA: Diagnosis not present

## 2022-05-05 DIAGNOSIS — Z808 Family history of malignant neoplasm of other organs or systems: Secondary | ICD-10-CM | POA: Diagnosis not present

## 2022-05-05 DIAGNOSIS — Z803 Family history of malignant neoplasm of breast: Secondary | ICD-10-CM | POA: Diagnosis not present

## 2022-05-05 DIAGNOSIS — Z8249 Family history of ischemic heart disease and other diseases of the circulatory system: Secondary | ICD-10-CM

## 2022-05-05 DIAGNOSIS — I132 Hypertensive heart and chronic kidney disease with heart failure and with stage 5 chronic kidney disease, or end stage renal disease: Secondary | ICD-10-CM | POA: Diagnosis present

## 2022-05-05 DIAGNOSIS — E1122 Type 2 diabetes mellitus with diabetic chronic kidney disease: Secondary | ICD-10-CM | POA: Diagnosis not present

## 2022-05-05 DIAGNOSIS — I5023 Acute on chronic systolic (congestive) heart failure: Secondary | ICD-10-CM | POA: Diagnosis not present

## 2022-05-05 DIAGNOSIS — G2581 Restless legs syndrome: Secondary | ICD-10-CM | POA: Diagnosis present

## 2022-05-05 DIAGNOSIS — R609 Edema, unspecified: Secondary | ICD-10-CM | POA: Diagnosis not present

## 2022-05-05 DIAGNOSIS — N189 Chronic kidney disease, unspecified: Secondary | ICD-10-CM | POA: Diagnosis present

## 2022-05-05 DIAGNOSIS — Z8042 Family history of malignant neoplasm of prostate: Secondary | ICD-10-CM

## 2022-05-05 DIAGNOSIS — Z6831 Body mass index (BMI) 31.0-31.9, adult: Secondary | ICD-10-CM

## 2022-05-05 DIAGNOSIS — Z20822 Contact with and (suspected) exposure to covid-19: Secondary | ICD-10-CM | POA: Diagnosis present

## 2022-05-05 DIAGNOSIS — I480 Paroxysmal atrial fibrillation: Secondary | ICD-10-CM | POA: Diagnosis present

## 2022-05-05 DIAGNOSIS — Z825 Family history of asthma and other chronic lower respiratory diseases: Secondary | ICD-10-CM

## 2022-05-05 DIAGNOSIS — Z86718 Personal history of other venous thrombosis and embolism: Secondary | ICD-10-CM

## 2022-05-05 DIAGNOSIS — I2510 Atherosclerotic heart disease of native coronary artery without angina pectoris: Secondary | ICD-10-CM | POA: Diagnosis present

## 2022-05-05 DIAGNOSIS — R531 Weakness: Secondary | ICD-10-CM | POA: Diagnosis present

## 2022-05-05 DIAGNOSIS — I10 Essential (primary) hypertension: Secondary | ICD-10-CM | POA: Diagnosis present

## 2022-05-05 DIAGNOSIS — Z794 Long term (current) use of insulin: Secondary | ICD-10-CM

## 2022-05-05 DIAGNOSIS — I440 Atrioventricular block, first degree: Secondary | ICD-10-CM | POA: Diagnosis present

## 2022-05-05 DIAGNOSIS — Z800 Family history of malignant neoplasm of digestive organs: Secondary | ICD-10-CM | POA: Diagnosis not present

## 2022-05-05 DIAGNOSIS — Z79899 Other long term (current) drug therapy: Secondary | ICD-10-CM

## 2022-05-05 DIAGNOSIS — E785 Hyperlipidemia, unspecified: Secondary | ICD-10-CM | POA: Diagnosis present

## 2022-05-05 DIAGNOSIS — R601 Generalized edema: Secondary | ICD-10-CM

## 2022-05-05 DIAGNOSIS — N185 Chronic kidney disease, stage 5: Secondary | ICD-10-CM | POA: Diagnosis present

## 2022-05-05 DIAGNOSIS — E1140 Type 2 diabetes mellitus with diabetic neuropathy, unspecified: Secondary | ICD-10-CM | POA: Diagnosis not present

## 2022-05-05 DIAGNOSIS — R188 Other ascites: Secondary | ICD-10-CM | POA: Diagnosis not present

## 2022-05-05 DIAGNOSIS — N179 Acute kidney failure, unspecified: Principal | ICD-10-CM | POA: Diagnosis present

## 2022-05-05 DIAGNOSIS — R0600 Dyspnea, unspecified: Secondary | ICD-10-CM | POA: Diagnosis not present

## 2022-05-05 DIAGNOSIS — J810 Acute pulmonary edema: Principal | ICD-10-CM

## 2022-05-05 DIAGNOSIS — R0602 Shortness of breath: Secondary | ICD-10-CM | POA: Diagnosis not present

## 2022-05-05 DIAGNOSIS — J90 Pleural effusion, not elsewhere classified: Secondary | ICD-10-CM | POA: Diagnosis not present

## 2022-05-05 DIAGNOSIS — D631 Anemia in chronic kidney disease: Secondary | ICD-10-CM | POA: Diagnosis present

## 2022-05-05 DIAGNOSIS — Z743 Need for continuous supervision: Secondary | ICD-10-CM | POA: Diagnosis not present

## 2022-05-05 DIAGNOSIS — I5022 Chronic systolic (congestive) heart failure: Secondary | ICD-10-CM | POA: Diagnosis present

## 2022-05-05 DIAGNOSIS — Z8572 Personal history of non-Hodgkin lymphomas: Secondary | ICD-10-CM

## 2022-05-05 DIAGNOSIS — Z823 Family history of stroke: Secondary | ICD-10-CM

## 2022-05-05 DIAGNOSIS — E8770 Fluid overload, unspecified: Secondary | ICD-10-CM | POA: Diagnosis not present

## 2022-05-05 DIAGNOSIS — I252 Old myocardial infarction: Secondary | ICD-10-CM | POA: Diagnosis not present

## 2022-05-05 DIAGNOSIS — N289 Disorder of kidney and ureter, unspecified: Secondary | ICD-10-CM | POA: Diagnosis not present

## 2022-05-05 DIAGNOSIS — D696 Thrombocytopenia, unspecified: Secondary | ICD-10-CM | POA: Diagnosis present

## 2022-05-05 DIAGNOSIS — Z7982 Long term (current) use of aspirin: Secondary | ICD-10-CM

## 2022-05-05 DIAGNOSIS — R635 Abnormal weight gain: Secondary | ICD-10-CM | POA: Diagnosis present

## 2022-05-05 DIAGNOSIS — E119 Type 2 diabetes mellitus without complications: Secondary | ICD-10-CM

## 2022-05-05 DIAGNOSIS — E1169 Type 2 diabetes mellitus with other specified complication: Secondary | ICD-10-CM | POA: Diagnosis not present

## 2022-05-05 LAB — TROPONIN I (HIGH SENSITIVITY): Troponin I (High Sensitivity): 36 ng/L — ABNORMAL HIGH (ref <18)

## 2022-05-05 LAB — I-STAT VENOUS BLOOD GAS, ED
Acid-base deficit: 1 mmol/L (ref 0.0–2.0)
Bicarbonate: 22.7 mmol/L (ref 20.0–28.0)
Calcium, Ion: 1.16 mmol/L (ref 1.15–1.40)
HCT: 28 % — ABNORMAL LOW (ref 39.0–52.0)
Hemoglobin: 9.5 g/dL — ABNORMAL LOW (ref 13.0–17.0)
O2 Saturation: 99 %
Potassium: 3.8 mmol/L (ref 3.5–5.1)
Sodium: 145 mmol/L (ref 135–145)
TCO2: 24 mmol/L (ref 22–32)
pCO2, Ven: 34.7 mmHg — ABNORMAL LOW (ref 44–60)
pH, Ven: 7.424 (ref 7.25–7.43)
pO2, Ven: 139 mmHg — ABNORMAL HIGH (ref 32–45)

## 2022-05-05 LAB — COMPREHENSIVE METABOLIC PANEL
ALT: 26 U/L (ref 0–44)
AST: 34 U/L (ref 15–41)
Albumin: 3.4 g/dL — ABNORMAL LOW (ref 3.5–5.0)
Alkaline Phosphatase: 78 U/L (ref 38–126)
Anion gap: 16 — ABNORMAL HIGH (ref 5–15)
BUN: 87 mg/dL — ABNORMAL HIGH (ref 8–23)
CO2: 20 mmol/L — ABNORMAL LOW (ref 22–32)
Calcium: 9.6 mg/dL (ref 8.9–10.3)
Chloride: 109 mmol/L (ref 98–111)
Creatinine, Ser: 5.26 mg/dL — ABNORMAL HIGH (ref 0.61–1.24)
GFR, Estimated: 12 mL/min — ABNORMAL LOW (ref >60)
Glucose, Bld: 100 mg/dL — ABNORMAL HIGH (ref 70–99)
Potassium: 3.8 mmol/L (ref 3.5–5.1)
Sodium: 145 mmol/L (ref 135–145)
Total Bilirubin: 1 mg/dL (ref 0.3–1.2)
Total Protein: 6.9 g/dL (ref 6.5–8.1)

## 2022-05-05 LAB — LIPASE, BLOOD: Lipase: 55 U/L — ABNORMAL HIGH (ref 11–51)

## 2022-05-05 LAB — CBC WITH DIFFERENTIAL/PLATELET
Abs Immature Granulocytes: 0.01 10*3/uL (ref 0.00–0.07)
Basophils Absolute: 0.1 10*3/uL (ref 0.0–0.1)
Basophils Relative: 1 %
Eosinophils Absolute: 0.1 10*3/uL (ref 0.0–0.5)
Eosinophils Relative: 2 %
HCT: 29.4 % — ABNORMAL LOW (ref 39.0–52.0)
Hemoglobin: 10.1 g/dL — ABNORMAL LOW (ref 13.0–17.0)
Immature Granulocytes: 0 %
Lymphocytes Relative: 14 %
Lymphs Abs: 0.7 10*3/uL (ref 0.7–4.0)
MCH: 35.2 pg — ABNORMAL HIGH (ref 26.0–34.0)
MCHC: 34.4 g/dL (ref 30.0–36.0)
MCV: 102.4 fL — ABNORMAL HIGH (ref 80.0–100.0)
Monocytes Absolute: 0.5 10*3/uL (ref 0.1–1.0)
Monocytes Relative: 10 %
Neutro Abs: 3.2 10*3/uL (ref 1.7–7.7)
Neutrophils Relative %: 73 %
Platelets: 71 10*3/uL — ABNORMAL LOW (ref 150–400)
RBC: 2.87 MIL/uL — ABNORMAL LOW (ref 4.22–5.81)
RDW: 14.6 % (ref 11.5–15.5)
WBC: 4.5 10*3/uL (ref 4.0–10.5)
nRBC: 0 % (ref 0.0–0.2)

## 2022-05-05 LAB — BRAIN NATRIURETIC PEPTIDE: B Natriuretic Peptide: 1548.6 pg/mL — ABNORMAL HIGH (ref 0.0–100.0)

## 2022-05-05 LAB — AMMONIA: Ammonia: 70 umol/L — ABNORMAL HIGH (ref 9–35)

## 2022-05-05 LAB — PROTIME-INR
INR: 1.3 — ABNORMAL HIGH (ref 0.8–1.2)
Prothrombin Time: 15.8 seconds — ABNORMAL HIGH (ref 11.4–15.2)

## 2022-05-05 MED ORDER — FUROSEMIDE 10 MG/ML IJ SOLN
60.0000 mg | Freq: Once | INTRAMUSCULAR | Status: AC
  Administered 2022-05-05: 60 mg via INTRAVENOUS
  Filled 2022-05-05: qty 6

## 2022-05-05 NOTE — ED Triage Notes (Signed)
Per [facility], pt from home, "platelets on blood work that was taken on Wed, were over 65,000, my Doctor has been watching it."  Abdomen is been swelling for past two weeks, which is causing the SOB according to pt.  Weakness has been worsening for the past "couple of months."  Normal stools, lungs clear.  128/60 76Pulse RR 16 98%RA CBG 82

## 2022-05-05 NOTE — ED Notes (Signed)
Ambulated pt to the restroom. Pt exhibits slow, steady gait. Pt denies feeling SOB, or dizziness. Spo2 96, HR 68.

## 2022-05-05 NOTE — ED Provider Notes (Signed)
[facility] EMERGENCY DEPARTMENT Provider Note   CSN: [id number] Arrival date & time: 05/05/22  2110     History  Chief Complaint  Patient presents with   Shortness of Breath   Weakness    Curtis Mays is a 61 y.o. male.  With PMH of ESRD no longer on PD, CHF, DM 2, HTN, HLD, paroxysmal A-fib, cirrhosis who was brought in by family for increased swelling in his lower extremities, abdomen and worsening orthopnea and dyspnea on exertion.  Patient notes that he has had weight gain of at least approximately 10 pounds in the past week or more with associated increased swelling of his lower extremities and abdomen.  He denies any pain in his abdomen but endorses discomfort from the pressure he places on his lungs and he is feeling more short of breath at rest, on ambulation and especially when laying flat.  He has been taking Lasix 80 mg 2 times daily and is still making urine but does not feel like it is helping.  He denies any fevers, chest pain, vomiting, hematemesis, hematochezia, melena.    Shortness of Breath Weakness Associated symptoms: shortness of breath        Home Medications Prior to Admission medications   Medication Sig Start Date End Date Taking? Authorizing Provider  ACCU-CHEK GUIDE test strip 3 (three) times daily. 12/21/20   [provider]  allopurinol (ZYLOPRIM) 100 MG tablet Take 100 mg by mouth daily.    [provider]  amiodarone (PACERONE) 200 MG tablet Take 200 mg by mouth daily.    [provider]  aspirin EC 81 MG EC tablet Take 1 tablet (81 mg total) by mouth daily. 02/22/19   Kathi Ludwig, MD  atorvastatin (LIPITOR) 40 MG tablet Take 1 tablet (40 mg total) by mouth daily at 6 PM. Patient not taking: Reported on 03/24/2020 02/21/19   Kathi Ludwig, MD  BD INSULIN SYRINGE U/F 31G X 5/16" 0.5 ML MISC USE 3 TIMES A DAY AS DIRECTED 12/18/20   [provider]  Blood Glucose Monitoring Suppl  (ACCU-CHEK GUIDE ME) w/Device KIT See admin instructions. 01/03/20   [provider]  carvedilol (COREG) 6.25 MG tablet Take 3.125 mg by mouth in the morning.     [provider]  Cholecalciferol 50 MCG (2000 UT) CAPS Take 1 capsule by mouth daily.    [provider]  furosemide (LASIX) 20 MG tablet Take 60 mg by mouth 2 (two) times daily. 11/27/20   [provider]  gentamicin cream (GARAMYCIN) 0.1 % See admin instructions. 04/24/20   [provider]  hydrALAZINE (APRESOLINE) 25 MG tablet Take 25 mg by mouth 3 (three) times daily.    [provider]  insulin glargine (LANTUS) 100 UNIT/ML injection Inject 30-40 Units into the skin at bedtime.  Patient not taking: Reported on 03/24/2020    [provider]  insulin lispro (HUMALOG) 100 UNIT/ML injection Inject into the skin See admin instructions. Inject into the skin three times a day before meals, per sliding scale: 10 units per 29 carbs    [provider]  isosorbide mononitrate (IMDUR) 30 MG 24 hr tablet Take 30 mg by mouth in the morning.    [provider]  Tamsulosin HCl (FLOMAX) 0.4 MG CAPS Take 0.4 mg by mouth at bedtime.     [provider]  zolpidem (AMBIEN) 10 MG tablet Take 10 mg by mouth at bedtime.    [provider]  Allergies    Patient has no known allergies.    Review of Systems   Review of Systems  Respiratory:  Positive for shortness of breath.   Neurological:  Positive for weakness.    Physical Exam Updated Vital Signs BP 123/60 (BP Location: Left Arm)   Pulse 66   Temp 98.5 F (36.9 C) (Oral)   Resp 16   Ht '5\' 11"'  (1.803 m)   Wt 101.6 kg   SpO2 97%   BMI 31.24 kg/m  Physical Exam Constitutional: Alert and oriented.  Chronically ill-appearing but no acute distress, nontoxic. Eyes: Conjunctivae are normal. ENT      Head: Normocephalic and atraumatic.      Nose: No congestion.      Mouth/Throat: Mucous membranes  are moist.      Neck: No stridor. Cardiovascular: S1, S2, regular rate and rhythm, warm and dry. Respiratory: No increased work of breathing but scattered mild end expiratory wheezes worse at the bases with fine crackles Gastrointestinal: Soft and distended but nontender to palpation Musculoskeletal: Normal range of motion in all extremities. Bilateral equal 2-3+ pitting edema extending from the feet to the thighs bilaterally, nontender, nonerythematous Neurologic: Normal speech and language. No gross focal neurologic deficits are appreciated. Skin: Skin is warm, Psychiatric: Mood and affect are normal. Speech and behavior are normal.  ED Results / Procedures / Treatments   Labs (all labs ordered are listed, but only abnormal results are displayed) Labs Reviewed  COMPREHENSIVE METABOLIC PANEL - Abnormal; Notable for the following components:      Result Value   CO2 20 (*)    Glucose, Bld 100 (*)    BUN 87 (*)    Creatinine, Ser 5.26 (*)    Albumin 3.4 (*)    GFR, Estimated 12 (*)    Anion gap 16 (*)    All other components within normal limits  CBC WITH DIFFERENTIAL/PLATELET - Abnormal; Notable for the following components:   RBC 2.87 (*)    Hemoglobin 10.1 (*)    HCT 29.4 (*)    MCV 102.4 (*)    MCH 35.2 (*)    Platelets 71 (*)    All other components within normal limits  PROTIME-INR - Abnormal; Notable for the following components:   Prothrombin Time 15.8 (*)    INR 1.3 (*)    All other components within normal limits  AMMONIA - Abnormal; Notable for the following components:   Ammonia 70 (*)    All other components within normal limits  BRAIN NATRIURETIC PEPTIDE - Abnormal; Notable for the following components:   B Natriuretic Peptide 1,548.6 (*)    All other components within normal limits  LIPASE, BLOOD - Abnormal; Notable for the following components:   Lipase 55 (*)    All other components within normal limits  I-STAT VENOUS BLOOD GAS, ED - Abnormal; Notable for  the following components:   pCO2, Ven 34.7 (*)    pO2, Ven 139 (*)    HCT 28.0 (*)    Hemoglobin 9.5 (*)    All other components within normal limits  TROPONIN I (HIGH SENSITIVITY) - Abnormal; Notable for the following components:   Troponin I (High Sensitivity) 36 (*)    All other components within normal limits  BLOOD GAS, VENOUS    EKG EKG Interpretation  Date/Time:  Sunday May 05 2022 22:53:20 EDT Ventricular Rate:  63 PR Interval:  264 QRS Duration: 100 QT Interval:  466 QTC Calculation: 476 R Axis:  110 Text Interpretation: *** Suspect arm lead reversal, interpretation assumes no reversal Sinus rhythm with 1st degree A-V block Left posterior fascicular block T wave abnormality, consider inferior ischemia Abnormal ECG When compared with ECG of 24-Mar-2020 16:22, PREVIOUS ECG IS PRESENT Nonspecific T wave changes inferior leads new from previous Confirmed by Georgina Snell [phone]) on 05/05/2022 11:09:07 PM  Radiology DG Chest 2 View  Result Date: 05/05/2022 CLINICAL DATA:  Dyspnea EXAM: CHEST - 2 VIEW COMPARISON:  03/24/2020 FINDINGS: The lungs are symmetrically well expanded. Mild bilateral perihilar interstitial pulmonary infiltrate has developed, airway inflammation versus trace perihilar pulmonary edema. Small bilateral pleural effusions are present, right greater than left. No pneumothorax. Cardiac size within normal limits. No acute bone abnormality. IMPRESSION: Interval development of mild perihilar interstitial pulmonary infiltrate, airway inflammation versus trace perihilar pulmonary edema. Small bilateral pleural effusions. Electronically Signed   By: Fidela Salisbury M.D.   On: 05/05/2022 22:08    Procedures Procedures  Remained on constant cardiac monitoring, normal sinus rhythm.  Medications Ordered in ED Medications  furosemide (LASIX) injection 60 mg (60 mg Intravenous Given 05/05/22 2313)    ED Course/ Medical Decision Making/ A&P Clinical Course as of  05/05/22 2331  Sun May 05, 2022  2217 On personal interpretation of chest x-ray, perihilar infiltrate, favor edema over pneumonia per chest x-ray read. [VB]    Clinical Course User Index [VB] Elgie Congo, MD                           Medical Decision Making Curtis Mays is a 61 y.o. male.  With PMH of ESRD no longer on PD, CHF, DM 2, HTN, HLD, paroxysmal A-fib, cirrhosis who was brought in by family for increased swelling in his lower extremities, abdomen and worsening orthopnea and dyspnea on exertion.  Patient presents with weight gain of approximately 10 pounds over the past couple weeks with worsening dyspnea on exertion, orthopnea and swelling of his abdomen and lower extremities.  His exam is consistent with anasarca likely secondary to cirrhosis versus worsening heart failure.  He has been taking 80 mg of IV Lasix po daily and is still making urine.  His work-up is consistent with fluid overload, BNP 1548, chest x-ray showed new evidence of pulmonary edema although not hypoxic with worsening kidney function creatinine 5.26 possibly secondary to underlying heart failure and elevated troponin 36 likely demand in the setting of fluid overload.  His EKG had nonspecific T wave changes but does not endorse any chest pain, vomiting or diaphoresis.  His ammonia level is elevated at 70 consistent with known cirrhosis but he is not encephalopathic.  He is anemic with hemoglobin 10.1 and thrombocytopenic platelet count 71 but denying any active bleeding, denying any melena or hematochezia or hematemesis, likely secondary to underlying known cirrhosis and changes secondary to cirrhosis.  I have ordered for IV Lasix 60 mg and consulted hospitalist for further work-up and management and likely continue diuresis and formal echocardiogram.  Amount and/or Complexity of Data Reviewed Labs: ordered. Decision-making details documented in ED Course. Radiology: ordered and independent interpretation  performed. Decision-making details documented in ED Course.    Details: Chest x-ray consistent with pulmonary edema and bilateral small pleural effusions agree with radiology read. ECG/medicine tests: independent interpretation performed. Decision-making details documented in ED Course.  Risk Prescription drug management. Decision regarding hospitalization.    Final Clinical Impression(s) / ED Diagnoses Final diagnoses:  Acute pulmonary edema ([place])  Anasarca  AKI (acute kidney injury) ([place])    Rx / DC Orders ED Discharge Orders     None

## 2022-05-06 ENCOUNTER — Observation Stay (HOSPITAL_COMMUNITY): Payer: Medicare Other

## 2022-05-06 ENCOUNTER — Inpatient Hospital Stay (HOSPITAL_COMMUNITY): Payer: Medicare Other

## 2022-05-06 DIAGNOSIS — D696 Thrombocytopenia, unspecified: Secondary | ICD-10-CM

## 2022-05-06 DIAGNOSIS — N185 Chronic kidney disease, stage 5: Secondary | ICD-10-CM | POA: Diagnosis present

## 2022-05-06 DIAGNOSIS — E8779 Other fluid overload: Secondary | ICD-10-CM | POA: Diagnosis not present

## 2022-05-06 DIAGNOSIS — N179 Acute kidney failure, unspecified: Secondary | ICD-10-CM | POA: Diagnosis present

## 2022-05-06 DIAGNOSIS — I5023 Acute on chronic systolic (congestive) heart failure: Secondary | ICD-10-CM | POA: Diagnosis not present

## 2022-05-06 DIAGNOSIS — I2510 Atherosclerotic heart disease of native coronary artery without angina pectoris: Secondary | ICD-10-CM | POA: Diagnosis present

## 2022-05-06 DIAGNOSIS — G894 Chronic pain syndrome: Secondary | ICD-10-CM | POA: Diagnosis present

## 2022-05-06 DIAGNOSIS — I480 Paroxysmal atrial fibrillation: Secondary | ICD-10-CM | POA: Diagnosis present

## 2022-05-06 DIAGNOSIS — D631 Anemia in chronic kidney disease: Secondary | ICD-10-CM

## 2022-05-06 DIAGNOSIS — R609 Edema, unspecified: Secondary | ICD-10-CM | POA: Diagnosis not present

## 2022-05-06 DIAGNOSIS — Z803 Family history of malignant neoplasm of breast: Secondary | ICD-10-CM | POA: Diagnosis not present

## 2022-05-06 DIAGNOSIS — Z800 Family history of malignant neoplasm of digestive organs: Secondary | ICD-10-CM | POA: Diagnosis not present

## 2022-05-06 DIAGNOSIS — I252 Old myocardial infarction: Secondary | ICD-10-CM | POA: Diagnosis not present

## 2022-05-06 DIAGNOSIS — R601 Generalized edema: Secondary | ICD-10-CM | POA: Diagnosis not present

## 2022-05-06 DIAGNOSIS — E1169 Type 2 diabetes mellitus with other specified complication: Secondary | ICD-10-CM

## 2022-05-06 DIAGNOSIS — E1122 Type 2 diabetes mellitus with diabetic chronic kidney disease: Secondary | ICD-10-CM | POA: Diagnosis present

## 2022-05-06 DIAGNOSIS — Z6831 Body mass index (BMI) 31.0-31.9, adult: Secondary | ICD-10-CM | POA: Diagnosis not present

## 2022-05-06 DIAGNOSIS — Z8042 Family history of malignant neoplasm of prostate: Secondary | ICD-10-CM | POA: Diagnosis not present

## 2022-05-06 DIAGNOSIS — N189 Chronic kidney disease, unspecified: Secondary | ICD-10-CM

## 2022-05-06 DIAGNOSIS — I5021 Acute systolic (congestive) heart failure: Secondary | ICD-10-CM

## 2022-05-06 DIAGNOSIS — I132 Hypertensive heart and chronic kidney disease with heart failure and with stage 5 chronic kidney disease, or end stage renal disease: Secondary | ICD-10-CM | POA: Diagnosis present

## 2022-05-06 DIAGNOSIS — Z794 Long term (current) use of insulin: Secondary | ICD-10-CM

## 2022-05-06 DIAGNOSIS — E785 Hyperlipidemia, unspecified: Secondary | ICD-10-CM | POA: Diagnosis present

## 2022-05-06 DIAGNOSIS — I10 Essential (primary) hypertension: Secondary | ICD-10-CM | POA: Diagnosis not present

## 2022-05-06 DIAGNOSIS — J810 Acute pulmonary edema: Secondary | ICD-10-CM

## 2022-05-06 DIAGNOSIS — Z20822 Contact with and (suspected) exposure to covid-19: Secondary | ICD-10-CM | POA: Diagnosis present

## 2022-05-06 DIAGNOSIS — I440 Atrioventricular block, first degree: Secondary | ICD-10-CM | POA: Diagnosis present

## 2022-05-06 DIAGNOSIS — E1140 Type 2 diabetes mellitus with diabetic neuropathy, unspecified: Secondary | ICD-10-CM | POA: Diagnosis present

## 2022-05-06 DIAGNOSIS — K7460 Unspecified cirrhosis of liver: Secondary | ICD-10-CM

## 2022-05-06 DIAGNOSIS — G2581 Restless legs syndrome: Secondary | ICD-10-CM | POA: Diagnosis present

## 2022-05-06 DIAGNOSIS — N19 Unspecified kidney failure: Secondary | ICD-10-CM | POA: Diagnosis present

## 2022-05-06 DIAGNOSIS — Z808 Family history of malignant neoplasm of other organs or systems: Secondary | ICD-10-CM | POA: Diagnosis not present

## 2022-05-06 DIAGNOSIS — R188 Other ascites: Secondary | ICD-10-CM | POA: Diagnosis present

## 2022-05-06 LAB — GLUCOSE, CAPILLARY
Glucose-Capillary: 135 mg/dL — ABNORMAL HIGH (ref 70–99)
Glucose-Capillary: 130 mg/dL — ABNORMAL HIGH (ref 70–99)

## 2022-05-06 LAB — CBG MONITORING, ED
Glucose-Capillary: 93 mg/dL (ref 70–99)
Glucose-Capillary: 116 mg/dL — ABNORMAL HIGH (ref 70–99)

## 2022-05-06 LAB — ECHOCARDIOGRAM COMPLETE
Area-P 1/2: 4.49 cm2
Height: 71 in
S' Lateral: 4.8 cm
Weight: 3569.69 oz

## 2022-05-06 LAB — HIV ANTIBODY (ROUTINE TESTING W REFLEX): HIV Screen 4th Generation wRfx: NONREACTIVE

## 2022-05-06 MED ORDER — OXYCODONE HCL 5 MG PO TABS
5.0000 mg | ORAL_TABLET | Freq: Two times a day (BID) | ORAL | Status: DC | PRN
  Administered 2022-05-06 – 2022-05-08 (×3): 5 mg via ORAL
  Filled 2022-05-06 (×4): qty 1

## 2022-05-06 MED ORDER — SODIUM CHLORIDE 0.9 % IV SOLN
250.0000 mL | INTRAVENOUS | Status: DC | PRN
Start: 2022-05-06 — End: 2022-05-08

## 2022-05-06 MED ORDER — FUROSEMIDE 10 MG/ML IJ SOLN
80.0000 mg | Freq: Two times a day (BID) | INTRAMUSCULAR | Status: DC
  Administered 2022-05-06 – 2022-05-08 (×5): 80 mg via INTRAVENOUS
  Filled 2022-05-06 (×5): qty 8

## 2022-05-06 MED ORDER — FUROSEMIDE 10 MG/ML IJ SOLN
60.0000 mg | Freq: Once | INTRAMUSCULAR | Status: DC
Start: 2022-05-06 — End: 2022-05-06

## 2022-05-06 MED ORDER — TAMSULOSIN HCL 0.4 MG PO CAPS
0.4000 mg | ORAL_CAPSULE | Freq: Every day | ORAL | Status: DC
  Administered 2022-05-06 – 2022-05-07 (×2): 0.4 mg via ORAL
  Filled 2022-05-06 (×2): qty 1

## 2022-05-06 MED ORDER — ASPIRIN 81 MG PO TBEC
81.0000 mg | DELAYED_RELEASE_TABLET | Freq: Every day | ORAL | Status: DC
  Administered 2022-05-06 – 2022-05-08 (×3): 81 mg via ORAL
  Filled 2022-05-06 (×3): qty 1

## 2022-05-06 MED ORDER — INSULIN ASPART 100 UNIT/ML IJ SOLN
0.0000 [IU] | Freq: Three times a day (TID) | INTRAMUSCULAR | Status: DC
  Administered 2022-05-06: 1 [IU] via SUBCUTANEOUS

## 2022-05-06 MED ORDER — ATORVASTATIN CALCIUM 40 MG PO TABS
40.0000 mg | ORAL_TABLET | Freq: Every day | ORAL | Status: DC
  Administered 2022-05-06 – 2022-05-07 (×2): 40 mg via ORAL
  Filled 2022-05-06 (×2): qty 1

## 2022-05-06 MED ORDER — ALLOPURINOL 100 MG PO TABS
100.0000 mg | ORAL_TABLET | Freq: Every day | ORAL | Status: DC
  Administered 2022-05-06 – 2022-05-08 (×3): 100 mg via ORAL
  Filled 2022-05-06 (×3): qty 1

## 2022-05-06 MED ORDER — SODIUM CHLORIDE 0.9% FLUSH
3.0000 mL | Freq: Two times a day (BID) | INTRAVENOUS | Status: DC
  Administered 2022-05-06 – 2022-05-08 (×5): 3 mL via INTRAVENOUS

## 2022-05-06 MED ORDER — FUROSEMIDE 10 MG/ML IJ SOLN: 60.0000 mg | Freq: Two times a day (BID) | INTRAMUSCULAR | Status: DC

## 2022-05-06 MED ORDER — AMIODARONE HCL 200 MG PO TABS
200.0000 mg | ORAL_TABLET | Freq: Every day | ORAL | Status: DC
  Administered 2022-05-06 – 2022-05-08 (×3): 200 mg via ORAL
  Filled 2022-05-06 (×3): qty 1

## 2022-05-06 MED ORDER — ACETAMINOPHEN 325 MG PO TABS: 650.0000 mg | ORAL_TABLET | ORAL | Status: DC | PRN

## 2022-05-06 MED ORDER — ZOLPIDEM TARTRATE 5 MG PO TABS
10.0000 mg | ORAL_TABLET | Freq: Every day | ORAL | Status: DC
  Administered 2022-05-06 – 2022-05-07 (×2): 10 mg via ORAL
  Filled 2022-05-06 (×2): qty 2

## 2022-05-06 MED ORDER — ONDANSETRON HCL 4 MG/2ML IJ SOLN: 4.0000 mg | Freq: Four times a day (QID) | INTRAMUSCULAR | Status: DC | PRN

## 2022-05-06 MED ORDER — SODIUM CHLORIDE 0.9% FLUSH: 3.0000 mL | INTRAVENOUS | Status: DC | PRN

## 2022-05-06 MED ORDER — FUROSEMIDE 10 MG/ML IJ SOLN
120.0000 mg | Freq: Two times a day (BID) | INTRAVENOUS | Status: DC
  Filled 2022-05-06: qty 12

## 2022-05-06 NOTE — Assessment & Plan Note (Addendum)
Follow up on abdominal US Cirrhosis was a incidental finding as outpatient, during work for thrombocytopenia. No signs of decompensated liver disease.

## 2022-05-06 NOTE — Assessment & Plan Note (Signed)
Getting erythropoetin injections with heme/onc

## 2022-05-06 NOTE — Assessment & Plan Note (Addendum)
Hold on antihypertensive medications,  Continue with aggressive diuresis for volume overload.
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
## 2022-05-06 NOTE — Progress Notes (Signed)
Progress Note   Patient: Curtis Mays KGM:[id number] DOB: 1961-01-01 DOA: 05/05/2022     0 DOS: the patient was seen and examined on 05/06/2022   Brief hospital course: Curtis Mays was admitted to the hospital with the working diagnosis of volume overload in the setting of CKD stage 16.   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 Reported 4 weeks of lower extremity edema and abdominal swelling along with orthopnea and dyspnea. 10 lbs weight gain. At home patient has been eating about one water melon per week, along with diet indiscretion with not salt restricted diet.   On his initial physical examination his blood pressure 123/60, HR 66, RR 16 and 02 saturation 97%, lungs with expiratory wheezing, heart with S1 and S2 present and rhythmic, abdomen with distention, positive bilateral lower extremity edema +++ pitting.   Na 145, K 3,8 Cl 109, bicarbonate 20 glucose 100 bun 87 cr 5.,26  BNP 1,548  High sensitive troponin 36  Wbc 4,5 hgb 10.1 plt 71   Chest radiograph with bilateral hilar vascular congestion, interstitial infiltrates more at lower lobes and bilateral small pleural effusions.   EKG 63 bpm, right axis, qtc 476, 1st degree AV block, sinus rhythm with no significant ST segment or T wave changes.    Assessment and Plan: * Acute kidney injury superimposed on chronic kidney disease (HCC) CKD stage 5.   Patient with improvement of his symptoms after diuresis with furosemide IV Continue to have lower extremity edema  Plan to continue with IV furosemide to further target negative fluid balance  Patient with no acidosis or hyperkalemia His oxygenation has improved and at the time of my examination he is on room air. His base cr is around 4 and follows up with [facility] as outpatient.   Acute on chronic systolic CHF (congestive heart failure) (HCC) Volume has  improved with diuresis. Plan to follow up on echocardiogram. Continue close blood pressure monitoring, limited pharmacologic therapy due to reduced GFR.  Hold in isosorbide, hydralazine and carvedilol for now.   Check [place] lower extremities, patient had DVT in the past in the right leg.   Cirrhosis ([place]) Follow up on abdominal US Cirrhosis was a incidental finding as outpatient, during work for thrombocytopenia. No signs of decompensated liver disease.   HTN (hypertension) Hold on antihypertensive medications,  Continue with aggressive diuresis for volume overload.   DM2 (diabetes mellitus, type 2) (HCC) Glucose has been stable. Plan to continue insulin sliding scale for glucose cover and monitoring.   Anemia in chronic kidney disease Getting erythropoetin injections with heme/onc        Subjective: Patient with improvement of dyspnea, continue to have lower extremity edema. No chest pain.   Physical Exam: Vitals:   05/06/22 0700 05/06/22 1000 05/06/22 1043 05/06/22 1100  BP: 126/64 (!) 104/47  127/68  Pulse: 68 69  66  Resp: (!) 21 16  16   Temp:   98.9 F (37.2 C)   TempSrc:   Oral   SpO2: 98% 97%  98%  Weight:      Height:       Neurology awake and alert, comfortable with no dyspnea.  ENT with no pallor Cardiovascular with S1 and S2 present and rhythmic with no gallops or rubs Respiratory with mild rales at bases but not wheezing or rhonchi Abdomen with no distention  Positive lower  extremity edema pitting bilaterally ++  Data Reviewed:    Family Communication: no family at the bedside   Disposition: Status is: Observation The patient will require care spanning > 2 midnights and should be moved to inpatient because: heart failure and renal failure   Planned Discharge Destination: Home      Author: Tawni Millers, MD 05/06/2022 11:38 AM  For on call review [email or web address].

## 2022-05-06 NOTE — Hospital Course (Addendum)
Curtis Mays was admitted to the hospital with the working diagnosis of volume overload in the setting of CKD stage 19.   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 Reported 4 weeks of lower extremity edema and abdominal swelling along with orthopnea and dyspnea. 10 lbs weight gain. At home patient has been eating about one watermelon per week, along with diet indiscretion with no salt restricted diet.   On his initial physical examination his blood pressure 123/60, HR 66, RR 16 and 02 saturation 97%, lungs with expiratory wheezing, heart with S1 and S2 present and rhythmic, abdomen with distention, positive bilateral lower extremity edema +++ pitting.   Na 145, K 3,8 Cl 109, bicarbonate 20 glucose 100 bun 87 cr 5.,26  BNP 1,548  High sensitive troponin 36  Wbc 4,5 hgb 10.1 plt 71   Chest radiograph with bilateral hilar vascular congestion, interstitial infiltrates more at lower lobes and bilateral small pleural effusions.   EKG 63 bpm, right axis, qtc 476, 1st degree AV block, sinus rhythm with no significant ST segment or T wave changes.    Patient was placed on IV furosemide 80 mg q12 hr with improvement in volume status. Patient will continue oral furosemide at the time of his discharge and will follow up with nephrology as outpatient.  Advices about salt and fluid restriction.

## 2022-05-06 NOTE — Assessment & Plan Note (Signed)
With slight worsening in creat today to 5.2 up from baseline ~4.6. Trying to diurese to see how he responds given obvious volume overload on exam and clinically. Very likely to warrant nephrology consult in AM. High risk that he winds up back on dialysis (which he had required in the past).

## 2022-05-06 NOTE — Assessment & Plan Note (Addendum)
Volume has improved with diuresis. Plan to follow up on echocardiogram. Continue close blood pressure monitoring, limited pharmacologic therapy due to reduced GFR.  Hold in isosorbide, hydralazine and carvedilol for now.   Check [place] lower extremities, patient had DVT in the past in the right leg.

## 2022-05-06 NOTE — Assessment & Plan Note (Signed)
SCDs only for DVT ppx Appears to be chronic and stable Presumably secondary to cirrhosis.
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
## 2022-05-06 NOTE — Assessment & Plan Note (Addendum)
CKD stage 5. Hyper P  Patient was placed on IV furosemide for diuresis, negative fluid balance was achieved, - Plan to continue with IV furosemide to further target negative fluid balance, loosing 7 kg since admission with significant improvement in his symptoms.   Patient did not require renal replacement therapy during this hospitalization. Plan to continue furosemide for diuresis.  Follow up renal function as outpatient, His antihypertensive medications were held during his hospitalization, at the time of discharge will continue to hold on hydralazine to prevent hypotension.   At the time of his discharge his renal function had a serum cr of 5,0 with K at 3,6 and serum bicarbonate of 20.  P 5,2  Added phosphate binders.
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
## 2022-05-06 NOTE — Assessment & Plan Note (Addendum)
His glucose remained stable during his hospitalization. He did receive insulin sliding scale for glucose cover and monitoring.

## 2022-05-06 NOTE — Progress Notes (Signed)
  Echocardiogram 2D Echocardiogram has been performed.  Curtis Mays 05/06/2022, 1:56 PM

## 2022-05-06 NOTE — Progress Notes (Signed)
Lower extremity venous bilateral study completed.   Please see CV Proc for preliminary results.   Davione Lenker, RDMS, RVT  

## 2022-05-06 NOTE — ED Notes (Signed)
Pt transported to vascular.  °

## 2022-05-07 ENCOUNTER — Inpatient Hospital Stay (HOSPITAL_COMMUNITY): Payer: Medicare Other

## 2022-05-07 ENCOUNTER — Other Ambulatory Visit: Payer: Self-pay | Admitting: Pharmacist

## 2022-05-07 ENCOUNTER — Encounter (HOSPITAL_COMMUNITY): Payer: Self-pay | Admitting: Internal Medicine

## 2022-05-07 DIAGNOSIS — N189 Chronic kidney disease, unspecified: Secondary | ICD-10-CM | POA: Diagnosis not present

## 2022-05-07 DIAGNOSIS — N179 Acute kidney failure, unspecified: Secondary | ICD-10-CM | POA: Diagnosis not present

## 2022-05-07 HISTORY — PX: IR PARACENTESIS: IMG2679

## 2022-05-07 LAB — BASIC METABOLIC PANEL
Anion gap: 10 (ref 5–15)
BUN: 88 mg/dL — ABNORMAL HIGH (ref 8–23)
CO2: 21 mmol/L — ABNORMAL LOW (ref 22–32)
Calcium: 9.3 mg/dL (ref 8.9–10.3)
Chloride: 112 mmol/L — ABNORMAL HIGH (ref 98–111)
Creatinine, Ser: 5.1 mg/dL — ABNORMAL HIGH (ref 0.61–1.24)
GFR, Estimated: 12 mL/min — ABNORMAL LOW (ref >60)
Glucose, Bld: 122 mg/dL — ABNORMAL HIGH (ref 70–99)
Potassium: 3.7 mmol/L (ref 3.5–5.1)
Sodium: 143 mmol/L (ref 135–145)

## 2022-05-07 LAB — MAGNESIUM: Magnesium: 2.5 mg/dL — ABNORMAL HIGH (ref 1.7–2.4)

## 2022-05-07 LAB — GLUCOSE, CAPILLARY: Glucose-Capillary: 105 mg/dL — ABNORMAL HIGH (ref 70–99)

## 2022-05-07 LAB — PHOSPHORUS: Phosphorus: 5.2 mg/dL — ABNORMAL HIGH (ref 2.5–4.6)

## 2022-05-07 LAB — HEMOGLOBIN A1C
Hgb A1c MFr Bld: 4.9 % (ref 4.8–5.6)
Mean Plasma Glucose: 93.93 mg/dL

## 2022-05-07 MED ORDER — LIDOCAINE HCL (PF) 1 % IJ SOLN
INTRAMUSCULAR | Status: AC | PRN
  Administered 2022-05-07: 5 mL

## 2022-05-07 MED ORDER — CALCIUM ACETATE (PHOS BINDER) 667 MG PO CAPS
667.0000 mg | ORAL_CAPSULE | Freq: Three times a day (TID) | ORAL | Status: DC
  Administered 2022-05-07 – 2022-05-08 (×3): 667 mg via ORAL
  Filled 2022-05-07 (×3): qty 1

## 2022-05-07 MED ORDER — LIDOCAINE HCL 1 % IJ SOLN
INTRAMUSCULAR | Status: AC
  Filled 2022-05-07: qty 20

## 2022-05-07 NOTE — Progress Notes (Signed)
Progress Note   Patient: Curtis Mays:[id number] DOB: Dec 01, 1960 DOA: 05/05/2022     1 DOS: the patient was seen and examined on 05/07/2022   Brief hospital course: Curtis Mays was admitted to the hospital with the working diagnosis of volume overload in the setting of CKD stage 83.   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 Reported 4 weeks of lower extremity edema and abdominal swelling along with orthopnea and dyspnea. 10 lbs weight gain. At home patient has been eating about one water melon per week, along with diet indiscretion with not salt restricted diet.   On his initial physical examination his blood pressure 123/60, HR 66, RR 16 and 02 saturation 97%, lungs with expiratory wheezing, heart with S1 and S2 present and rhythmic, abdomen with distention, positive bilateral lower extremity edema +++ pitting.    Chest radiograph with bilateral hilar vascular congestion, interstitial infiltrates more at lower lobes and bilateral small pleural effusions.   EKG 63 bpm, right axis, qtc 476, 1st degree AV block, sinus rhythm with no significant ST segment or T wave changes.    Assessment and Plan: * Acute kidney injury superimposed on chronic kidney disease (HCC) CKD stage 5.  There appears to have fluid overload significant peripheral edema/anasarca -Abdominal ultrasound showed loculated ascites, patient reported never had paracentesis before.  Ordered IR guided paracentesis to relieve symptoms.  Etiology of ascites likely combination of CHF and worsening of CKD.  Acute on chronic systolic CHF (congestive heart failure) (HCC) -Today's echocardiogram showed further worsening of LVEF 30-35% compared to LVEF 45-50% in March of this year -kidney function stable, clinically appears still fluid overloaded, plan to continue Lasix 80 mg twice daily.  -Blood pressure remains on  the borderline low side, plan to continue to hold in isosorbide, hydralazine and carvedilol for now.   DVT study negative  Cirrhosis ([place]) with ascites -Synthetic liver function however appears to be preserved with albumin and INR only slightly elevated to 1.3. -Likely will need high-dose Lasix to maintain fluid balance -he already set up appointment to see hepatologist   HTN (hypertension) Hold on antihypertensive medications,  Continue with aggressive diuresis for volume overload.   DM2 (diabetes mellitus, type 2) (HCC) Glucose has been stable. Plan to continue insulin sliding scale for glucose cover and monitoring.   Anemia in chronic kidney disease Getting erythropoetin injections with heme/onc        Subjective:   Subjective:  Eyes: PERRL, lids and conjunctivae normal ENMT: Mucous membranes are moist. Posterior pharynx clear of any exudate or lesions.Normal dentition.  Neck: normal, supple, no masses, no thyromegaly Respiratory: clear to auscultation bilaterally, no wheezing, scattered crackles bilateral lower fields. Normal respiratory effort. No accessory muscle use.  Cardiovascular: Regular rate and rhythm, no murmurs / rubs / gallops.  2+ extremity edema. 2+ pedal pulses. No carotid bruits.  Abdomen: no tenderness, no masses palpated. No hepatosplenomegaly. Bowel sounds positive.  Positive ascites sign Musculoskeletal: no clubbing / cyanosis. No joint deformity upper and lower extremities. Good ROM, no contractures. Normal muscle tone.  Skin: no rashes, lesions, ulcers. No induration Neurologic: CN 2-12 grossly intact. Sensation intact, DTR normal.  Slight weakness on left side compared to right side Psychiatric: Normal judgment and insight. Alert and oriented x 3. Normal mood.    Physical Exam: Vitals:   05/06/22 2012 05/07/22 0017 05/07/22 0022 05/07/22 1019  BP: 115/67 119/73  132/72  Pulse: 63     Resp: 14 17    Temp: 98.3 F (36.8 C) 98.4 F (36.9 C)   97.7 F (36.5 C)  TempSrc: Oral Oral  Oral  SpO2: 96%   100%  Weight:   99.5 kg   Height:        Data Reviewed: BMP, echocardiogram and DVT study  Family Communication: None at bedside  Disposition: Status is: Inpatient Remains inpatient appropriate because: Still in CHF decompensation  Planned Discharge Destination: Home with Home Health    Time spent: 37 minutes  Author: Lequita Halt, MD 05/07/2022 12:39 PM  For on call review [email or web address].

## 2022-05-07 NOTE — Progress Notes (Signed)
Heart Failure Nurse Navigator Progress Note  PCP: Helen Hashimoto., MD PCP-Cardiologist: Olena Heckle Admission Diagnosis: Acute pulmonary edema, Anasacra, Acute Kidney injury.  Admitted from: Home via [facility]  Presentation:   Curtis Mays presented with shortness of breath, weakness, increased swelling to lower extremities and abdomen, weight gain 10 + pounds in last week. Takes all medications as prescribed. BP 123/60, HR 66, 2-3+ pitting edema from feet to thighs. Creatinine 5.26, BNP 1,548, Troponin 36. CXR showed new pulmonary edema, ammonia level at 70 with known cirrhosis, thrombocytopenic platelet count 71.   Patient was educated on the sign and symptoms of heart failure, daily weights, when to call his doctor or go to the ER. Diet/ fluid restrictions, especially sodium, patient stated he does eat too much of it, including some biscuits and gravy). Taking all his medications as prescribed and attending all medical appointments. Patient asked a number of questions and verbalized his understanding. A hospital HF TOC appointment is scheduled for 05/20/2022 @ 9 am.   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, face, neck    Neuropathy    nhl dx'd 06/2011   Obesity    Pulmonary nodules    Right Lower lobe   Restless leg syndrome    Right thyroid nodule      Social History   Socioeconomic History   Marital status: Married    Spouse name: JUDY   Number of children: 2   Years of  education: 10   Highest education level: Not on file  Occupational History   Occupation: RETIRED - [employer]  Tobacco Use   Smoking status: Former    Years: 10.00    Types: Cigarettes    Quit date: 09/02/1988    Years since quitting: 33.6   Smokeless tobacco: Never  Vaping Use   Vaping Use: Never used  Substance and Sexual Activity   Alcohol use: No   Drug use: Never   Sexual activity: Not Currently  Other Topics Concern   Not on file  Social History Narrative   Not on file   Social Determinants of Health   Financial Resource Strain: Low Risk  (05/07/2022)   Overall Financial Resource Strain (CARDIA)    Difficulty of Paying Living Expenses: Not hard at all  Food Insecurity: No Food Insecurity (05/07/2022)   Hunger Vital Sign    Worried About Running Out of Food in the Last Year: Never true    [place] in the Last Year: Never true  Transportation Needs: No Transportation Needs (05/07/2022)   PRAPARE - Hydrologist (Medical): No    Lack of Transportation (Non-Medical): No  Physical Activity: Not on file  Stress: Not on file  Social Connections: Not on file   Education Assessment and Provision:  Detailed education and instructions provided on heart failure disease management including the following:  Signs and symptoms of Heart Failure When to call the physician Importance  of daily weights Low sodium diet Fluid restriction Medication management Anticipated future follow-up appointments  Patient education given on each of the above topics.  Patient acknowledges understanding via teach back method and acceptance of all instructions.  Education Materials:  "Living Better With Heart Failure" Booklet, HF zone tool, & Daily Weight Tracker Tool.  Patient has scale at home: yes Patient has pill box at home: yes    High Risk Criteria for Readmission and/or Poor Patient Outcomes: Heart failure hospital admissions (last 6 months): 0   No Show rate: 15 % Difficult social situation: no Demonstrates medication adherence: Yes Primary Language: English Literacy level: Reading, writing, and comprehension.  Barriers of Care:   CKD 5, ( patient refused HD)  Diet & Fluid restrictions ( sodium)  Daily weights   Considerations/Referrals:   Referral made to Heart Failure Pharmacist Stewardship: Yes ( medication explanation to wife and daughter)  Referral made to Heart Failure CSW/NCM TOC: No Referral made to Heart & Vascular TOC clinic: Yes, 05/20/2022 @ 9 am.   Items for Follow-up on DC/TOC: Medication management CKD 5, ( refused HD)  Diet/ Fluid restrictions ( sodium)  Daily weights   Earnestine Leys, BSN, RN Heart Failure Transport planner Only

## 2022-05-07 NOTE — Progress Notes (Signed)
[facility] Progress Note  Name: Curtis Mays MRN: [id number] DOB: 08-01-1961  Chief Complaint:  Leg swelling and shortness of breath   Subjective:  he has had 550 mL UOP over 9/4 charted.   This does not capture all of his output, though.  He has been on lasix 80 mg IV BID. He states started saving his urine overnight.     Review of systems:  Denies shortness of breath  Denies n/v Still with abd bloating  Denies chest pain  ----------- Background on consult:  Curtis Mays is a 61 y.o. male with a history of CKD, not currently on dialysis type 2 diabetes mellitus, hypertension, CHF, MALT lymphoma, and cirrhosis who presented to the hospital with lower extremity edema and abdominal swelling.  The swelling had worsened over the past month.  He has had some shortness of breath as well.  He has been eating a lot more watermelon because he read online that it could help cirrhosis.  He's had about a watermelon a week.  He previously was on peritoneal dialysis several years ago however was able to come off.  Per his primary nephrologist, Dr. Johnney Ou, he is not sure if he would ever do dialysis again.  He does have a follow-up appt with Dr. Johnney Ou scheduled on 9/14.  Cr 5.26 vs mid to low 4's.  He states he had no swelling when he was last seen in the spring and has been on lasix 80 mg am and 40 mg in the evening since then.  He thinks torsemide worked better and wants to ask Dr. Johnney Ou about switching to this.  He was short of breath when he came in but this is much improved.  He states that his stomach feels softer and less distended as well.    He states that he would never want dialysis again "that's not living; let me die".  He states that he hopes to be around in November for the birth of a grandchild but that he would not want dialysis to make this happen if that situation arose.  RN at bedside during my discussion with him.  No strict ins/outs available.      Intake/Output  Summary (Last 24 hours) at 05/07/2022 6063 Last data filed at 05/06/2022 2014 Gross per 24 hour  Intake 120 ml  Output 550 ml  Net -430 ml    Vitals:  Vitals:   05/06/22 1524 05/06/22 2012 05/07/22 0017 05/07/22 0022  BP: 126/63 115/67 119/73   Pulse: 69 63    Resp: 18 14 17    Temp: (!) 97.5 F (36.4 C) 98.3 F (36.8 C) 98.4 F (36.9 C)   TempSrc: Oral Oral Oral   SpO2: 100% 96%    Weight:    99.5 kg  Height:         Physical Exam:  General: adult male in bed in NAD HEENT: NCAT Eyes: EOMI sclera anicteric Neck: supple trachea midline  Heart: S1S2 no rub Lungs: reduced breath sounds and normal work of breathing; on room air  Abdomen: soft but distended; nontender Extremities: 1+ edema lower extremities Skin: no rash on extremities exposed Neuro: alert and oriented x3 provides hx and follows commands Psych normal mood and affect GU no foley   Medications reviewed   Labs:     Latest Ref Rng & Units 05/07/2022    2:26 AM 05/05/2022   10:31 PM 05/05/2022    9:33 PM  BMP  Glucose 70 - 99 mg/dL  122   100   BUN 8 - 23 mg/dL 88   87   Creatinine 0.61 - 1.24 mg/dL 5.10   5.26   Sodium 135 - 145 mmol/L 143  145  145   Potassium 3.5 - 5.1 mmol/L 3.7  3.8  3.8   Chloride 98 - 111 mmol/L 112   109   CO2 22 - 32 mmol/L 21   20   Calcium 8.9 - 10.3 mg/dL 9.3   9.6      Assessment/Plan:   # CKD stage V - No emergent electrolyte abnormalities.  Hopeful for medical management as he would not ever want to pursue dialysis again (see above discussion) - diuretics as below - he follows with Dr. Johnney Ou and has close outpatient follow-up on 9/14 set up currently    # Acute on chronic systolic CHF  - Fluid overload  - agree with lasix 80 mg IV BID for now - he seems to be responding and ins/outs not being documented  - resume prior home regimen of lasix 80 mg BID on discharge and he plans to discuss a possible transition to torsemide with his outpatient nephrologist   # Cirrhosis -  note per charting primary team planned to order abd [place] to assess for need for paracentesis.  Cannot see this result and may not have been ordered.   - Defer [place] to primary team discretion    # Anemia of CKD  - gets EPO through hem/onc per charting    # HTN  - controlled on current regimen    # Thrombocytopenia - felt 2/2 cirrhosis/splenomegaly per hem/onc, Dr. Bobby Rumpf  Disposition would continue inpatient diuresis and then plan for discharge as early as 9/6 from my standpoint if renal function stable.    Claudia Desanctis, MD 05/07/2022 9:51 AM

## 2022-05-07 NOTE — Progress Notes (Signed)
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   He presented to the ED on 9/3 with abdominal and LE edema, shortness of breath, orthopnea, weight gain, and fatigue. CXR with small bilateral pleural effusions. An ECHO was done 9/4 and LVEF is 30-35% with G3DD, RV ok. Abdominal ultrasound with loculated ascites. Paracentesis today.   Current HF Medications: Diuretic: furosemide 80 mg IV BID  Prior to admission HF Medications: Diuretic: furosemide 80 mg BID Beta blocker: carvedilol 6.25 mg BID Other: hydralazine 25 mg TID + Imdur 60 mg daily  Pertinent Lab Values: Serum creatinine 5.10, BUN 88, Potassium 3.7, Sodium 143, BNP 1548.6, Magnesium 2.5, A1c 4.9  Vital Signs: Weight: 219 lbs (admission weight: 224 lbs) Blood pressure: 130/70s  Heart rate: 60s  I/O: -532m yesterday  Medication Assistance / Insurance Benefits Check: Does the patient have prescription insurance?  Yes Type of insurance plan: [employer] [employer]  Outpatient Pharmacy[facility]  [facility] Is the patient [facility]?   Pending    Assessment: 1. Acute on chronic systolic CHF (LVEF 334-19%. NYHA class III symptoms. - Continue furosemide 80 mg IV BID. I/O incomplete. Weight down 4 lbs since yesterday. Ensure strict I/Os with daily weights.  - Off carvedilol with bradycardia. HR 60s. Consider restarting prior to discharge pending vitals.  - GDMT limited with AKI on CKD. No ACE/ARB/ARNI, MRA, or SGLT2i. Ongoing discussions with renal about hemodialysis. eGFR 12. - Holding hydralazine and Imdur with labile BP.    Plan: 1) Medication changes recommended at this time: - Continue IV diuresis    2) Patient assistance: - None at this time  3)  Education  - To be completed prior to discharge  MKerby Nora PharmD, BCPS Heart Failure Stewardship Pharmacist Phone ([phone]

## 2022-05-07 NOTE — Consult Note (Signed)
   [facility] CM Inpatient Consult   05/07/2022  Curtis Mays 1961-06-16 [id number]  [place]  Accountable Care Organization [ACO] Patient: [employer] Medicare  Primary Care Provider:  Helen Hashimoto., MD,    Patient screened for hospitalization with noted high risk score for unplanned readmission risk and  to assess for potential [place] Management service needs for post hospital transition.  Review of patient's medical record reveals patient is for a procedure.  Discussed in unit progression meeting   Plan:  Continue to follow progress and disposition to assess for post hospital care management needs.    For questions contact:   Natividad Brood, RN BSN [facility] Liaison  [phone] business mobile phone Toll free office [phone]  Fax number: [phone] [email or web address]

## 2022-05-08 ENCOUNTER — Encounter (HOSPITAL_COMMUNITY): Payer: Self-pay | Admitting: Internal Medicine

## 2022-05-08 ENCOUNTER — Ambulatory Visit: Payer: Medicare Other

## 2022-05-08 ENCOUNTER — Encounter: Payer: Self-pay | Admitting: Oncology

## 2022-05-08 ENCOUNTER — Inpatient Hospital Stay: Payer: Medicare Other

## 2022-05-08 ENCOUNTER — Other Ambulatory Visit (HOSPITAL_COMMUNITY): Payer: Self-pay

## 2022-05-08 DIAGNOSIS — N179 Acute kidney failure, unspecified: Secondary | ICD-10-CM | POA: Diagnosis not present

## 2022-05-08 DIAGNOSIS — I10 Essential (primary) hypertension: Secondary | ICD-10-CM | POA: Diagnosis not present

## 2022-05-08 DIAGNOSIS — I2510 Atherosclerotic heart disease of native coronary artery without angina pectoris: Secondary | ICD-10-CM | POA: Diagnosis present

## 2022-05-08 DIAGNOSIS — I5023 Acute on chronic systolic (congestive) heart failure: Secondary | ICD-10-CM | POA: Diagnosis not present

## 2022-05-08 DIAGNOSIS — K7460 Unspecified cirrhosis of liver: Secondary | ICD-10-CM | POA: Diagnosis not present

## 2022-05-08 DIAGNOSIS — I2583 Coronary atherosclerosis due to lipid rich plaque: Secondary | ICD-10-CM

## 2022-05-08 DIAGNOSIS — I480 Paroxysmal atrial fibrillation: Secondary | ICD-10-CM | POA: Diagnosis present

## 2022-05-08 LAB — MAGNESIUM: Magnesium: 2.4 mg/dL (ref 1.7–2.4)

## 2022-05-08 LAB — GLUCOSE, CAPILLARY: Glucose-Capillary: 134 mg/dL — ABNORMAL HIGH (ref 70–99)

## 2022-05-08 LAB — BASIC METABOLIC PANEL
Anion gap: 9 (ref 5–15)
BUN: 86 mg/dL — ABNORMAL HIGH (ref 8–23)
CO2: 20 mmol/L — ABNORMAL LOW (ref 22–32)
Calcium: 9.2 mg/dL (ref 8.9–10.3)
Chloride: 112 mmol/L — ABNORMAL HIGH (ref 98–111)
Creatinine, Ser: 5 mg/dL — ABNORMAL HIGH (ref 0.61–1.24)
GFR, Estimated: 12 mL/min — ABNORMAL LOW (ref >60)
Glucose, Bld: 171 mg/dL — ABNORMAL HIGH (ref 70–99)
Potassium: 3.6 mmol/L (ref 3.5–5.1)
Sodium: 141 mmol/L (ref 135–145)

## 2022-05-08 MED ORDER — FUROSEMIDE 40 MG PO TABS: 80.0000 mg | ORAL_TABLET | Freq: Two times a day (BID) | ORAL | Status: DC

## 2022-05-08 MED ORDER — CALCIUM ACETATE (PHOS BINDER) 667 MG PO CAPS
667.0000 mg | ORAL_CAPSULE | Freq: Three times a day (TID) | ORAL | 0 refills | Status: AC
  Filled 2022-05-08: qty 90, 30d supply, fill #0

## 2022-05-08 NOTE — Progress Notes (Signed)
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   He presented to the ED on 9/3 with abdominal and LE edema, shortness of breath, orthopnea, weight gain, and fatigue. CXR with small bilateral pleural effusions. An ECHO was done 9/4 and LVEF is 30-35% with G3DD, RV ok. Abdominal ultrasound with loculated ascites. Paracentesis 9/5 removed 3.1L of fluid.   Current HF Medications: None  Prior to admission HF Medications: Diuretic: furosemide 80 mg BID Beta blocker: carvedilol 6.25 mg BID Other: hydralazine 25 mg TID + Imdur 60 mg daily  Pertinent Lab Values: Serum creatinine 5.00, BUN 86, Potassium 3.6, Sodium 141, BNP 1548.6, Magnesium 2.4, A1c 4.9  Vital Signs: Weight: 209 lbs (admission weight: 224 lbs) Blood pressure: 120/60s  Heart rate: 50-60s  I/O: -1.9L yesterday  Medication Assistance / Insurance Benefits Check: Does the patient have prescription insurance?  Yes Type of insurance plan: [facility] Medicare  Outpatient Pharmacy:  [facility] Is the patient willing to use [place] at discharge? Yes Is the patient willing to transition their outpatient pharmacy to utilize a [place] outpatient pharmacy?   Pending    Assessment: 1. Acute on chronic systolic CHF (LVEF 01-09%). NYHA class III symptoms. - Off IV lasix. Plan to resume furosemide 80 mg PO BID on discharge. I/O incomplete. Ensure strict I/Os with daily weights.  - Off carvedilol with bradycardia. HR 50-60s. Consider restarting prior to discharge pending vitals.  - GDMT limited with AKI on CKD. No ACE/ARB/ARNI, MRA, or SGLT2i. Ongoing discussions with renal about hemodialysis. eGFR 12. - Holding hydralazine and Imdur with labile BP.    Plan: 1) Medication changes recommended at this time: - Agree with transitioning to  PO lasix  2) Patient assistance: - None at this time   Kerby Nora, PharmD, BCPS Heart Failure Stewardship Pharmacist Phone [phone]

## 2022-05-08 NOTE — TOC Transition Note (Addendum)
Transition of Care [facility]) - CM/SW Discharge Note   Patient Details  Name: Curtis Mays MRN: [id number] Date of Birth: [birth year]  [facility] CM/SW Contact:  Zenon Mayo, RN Phone Number: 05/08/2022, 1:47 PM   Clinical Narrative:    From home with spouse, son will transport patient home today, he states he has no issues with his medications , has no issues with transportation to doctors apts.  TOC filled meds prior to dc.  He states he has no DME at home he is indep. He states he will start weighing his self daily, he does not check his bp often but will do more, he will try to eat a low sodium diet as best he can per patient.   He has no other needs.      Final next level of care: Home/Self Care Barriers to Discharge: No Barriers Identified   Patient Goals and CMS Choice Patient states their goals for this hospitalization and ongoing recovery are:: return home   Choice offered to / list presented to : NA  Discharge Placement                       Discharge Plan and Services In-house Referral: NA Discharge Planning Services: CM Consult Post Acute Care Choice: NA            DME Agency: NA       HH Arranged: NA          Social Determinants of Health (SDOH) Interventions Food Insecurity Interventions: Intervention Not Indicated Housing Interventions: Intervention Not Indicated Transportation Interventions: Intervention Not Indicated Utilities Interventions: Intervention Not Indicated Alcohol Usage Interventions: Intervention Not Indicated (Score <7) Financial Strain Interventions: Intervention Not Indicated   Readmission Risk Interventions    05/08/2022    1:43 PM  Readmission Risk Prevention Plan  Transportation Screening Complete  PCP or Specialist Appt within 3-5 Days Complete  HRI or Home Care Consult Complete  Social Work Consult for [place] Planning/Counseling Complete  Palliative Care Screening Not Applicable   Medication Review Press photographer) Complete

## 2022-05-08 NOTE — Progress Notes (Signed)
   05/08/22 1205  Mobility  Activity Ambulated independently in hallway  Level of Assistance Independent  Assistive Device None  Distance Ambulated (ft) 550 ft  Activity Response Tolerated well  $Mobility charge 1 Mobility   Mobility Specialist Progress Note  Received pt in EOB having no complaints and agreeable to mobility. Pt was asymptomatic throughout ambulation and returned to room w/o fault. Left in EOB w/ call bell in reach and all needs met.   Lucious Groves Mobility Specialist

## 2022-05-08 NOTE — Assessment & Plan Note (Addendum)
Patient will continue with amiodarone Currently is not on anticoagulation. Follows with cardiology in [facility].

## 2022-05-08 NOTE — TOC Initial Note (Signed)
Transition of Care [facility]) - Initial/Assessment Note    Patient Details  Name: Curtis Mays MRN: [id number] Date of Birth: 1960/12/22  Transition of Care [facility]) CM/SW Contact:    Zenon Mayo, RN Phone Number: 05/08/2022, 1:45 PM  Clinical Narrative:                 From home with spouse, son will transport patient home today, he states he has no issues with his medications , has no issues with transportation to doctors apts.  TOC filled meds prior to dc.  He states he has no DME at home he is indep.  He has no other needs.   Expected Discharge Plan: Home/Self Care Barriers to Discharge: No Barriers Identified   Patient Goals and CMS Choice Patient states their goals for this hospitalization and ongoing recovery are:: return home   Choice offered to / list presented to : NA  Expected Discharge Plan and Services Expected Discharge Plan: Home/Self Care In-house Referral: NA Discharge Planning Services: CM Consult Post Acute Care Choice: NA Living arrangements for the past 2 months: Single Family Home Expected Discharge Date: 05/08/22                 DME Agency: NA       HH Arranged: NA          Prior Living Arrangements/Services Living arrangements for the past 2 months: Single Family Home Lives with:: Spouse Patient language and need for interpreter reviewed:: Yes Do you feel safe going back to the place where you live?: Yes      Need for Family Participation in Patient Care: Yes (Comment) Care giver support system in place?: Yes (comment)   Criminal Activity/Legal Involvement Pertinent to Current Situation/Hospitalization: No - Comment as needed  Activities of Daily Living Home Assistive Devices/Equipment: None ADL Screening (condition at time of admission) Patient's cognitive ability adequate to safely complete daily activities?: Yes Is the patient deaf or have difficulty hearing?: No Does the patient have difficulty seeing, even when wearing  glasses/contacts?: No Does the patient have difficulty concentrating, remembering, or making decisions?: No Patient able to express need for assistance with ADLs?: Yes Does the patient have difficulty dressing or bathing?: No Independently performs ADLs?: Yes (appropriate for developmental age) Does the patient have difficulty walking or climbing stairs?: No Weakness of Legs: None Weakness of Arms/Hands: None  Permission Sought/Granted                  Emotional Assessment Appearance:: Appears stated age Attitude/Demeanor/Rapport: Engaged Affect (typically observed): Appropriate Orientation: : Oriented to Self, Oriented to Place, Oriented to  Time, Oriented to Situation Alcohol / Substance Use: Not Applicable Psych Involvement: No (comment)  Admission diagnosis:  Anasarca [R60.1] Acute pulmonary edema (HCC) [J81.0] Renal failure [N19] Volume overload [E87.70] AKI (acute kidney injury) ([place]) [N17.9] Patient Active Problem List   Diagnosis Date Noted   Paroxysmal atrial fibrillation ([place]) 05/08/2022   Coronary artery disease 05/08/2022   Acute kidney injury superimposed on chronic kidney disease ([place]) 05/06/2022   Unspecified protein-calorie malnutrition ([place]) 04/10/2020   Orthostatic hypotension 03/25/2020   Syncope 03/24/2020   Iron deficiency anemia, unspecified 03/02/2020   Encounter for immunization 02/29/2020   Long term (current) use of anticoagulants 02/29/2020   Liver disease, unspecified 02/28/2020   Other abnormal findings in urine 02/28/2020   Secondary hyperparathyroidism of renal origin ([place]) 02/28/2020   Unspecified jaundice 02/28/2020   Anemia in chronic kidney disease 02/22/2020   Benign  prostatic hyperplasia without lower urinary tract symptoms 02/22/2020   Chronic obstructive pulmonary disease, unspecified ([place]) 02/22/2020   Fracture of neck, unspecified, initial encounter ([place]) 02/22/2020   Gout, unspecified 02/22/2020   Heart failure, unspecified  ([place]) 02/22/2020   Localized edema 02/22/2020   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, unspecified 02/22/2020   Non-Hodgkin lymphoma, unspecified, unspecified site ([place]) 02/22/2020   Other specified postprocedural states 02/22/2020   Personal history of non-Hodgkin lymphomas 02/22/2020   Polyneuropathy, unspecified 02/22/2020   Proteinuria, unspecified 02/22/2020   Vitamin D deficiency, unspecified 02/22/2020   ESRD (end stage renal disease) ([place]) 02/07/2020   Coronary artery disease of native artery of native heart with stable angina pectoris ([place]) 01/04/2020   Creatinine elevation 11/16/2019   Elevated BUN 11/16/2019   Elevated troponin I level 11/16/2019   Macrocytic anemia 11/16/2019   CKD (chronic kidney disease), stage IV ([place]) 02/19/2019   Acute CHF (congestive heart failure) ([place]) 02/19/2019   Acute on chronic systolic CHF (congestive heart failure) ([place]) 12/22/2017   Pure hypercholesterolemia 12/22/2017   Cirrhosis ([place]) 03/18/2017   History of atrial fibrillation 01/13/2017   Precordial pain 01/12/2017   Dyspnea 08/11/2011   Leukocytosis 08/11/2011   PNA (pneumonia) 08/11/2011   Hyponatremia 08/11/2011   Hyperkalemia 08/11/2011   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     Right thyroid nodule    Aortic calcification (HCC)    PCP:  Helen Hashimoto., MD Pharmacy:   [id number] [place] [place] [facility] [address] [place] [address] Phone: [phone] Fax: [phone]  [facility] [address] [place] [address] Phone: [phone] Fax: [phone]     Social Determinants of Health (SDOH) Interventions Food  Insecurity Interventions: Intervention Not Indicated Housing Interventions: Intervention Not Indicated Transportation Interventions: Intervention Not Indicated Utilities Interventions: Intervention Not Indicated Alcohol Usage Interventions: Intervention Not Indicated (Score <7) Financial Strain Interventions: Intervention Not Indicated  Readmission Risk Interventions    05/08/2022    1:43 PM  Readmission Risk Prevention Plan  Transportation Screening Complete  PCP or Specialist Appt within 3-5 Days Complete  HRI or English Complete  Social Work Consult for [place] Planning/Counseling Complete  Palliative Care Screening Not Applicable  Medication Review Press photographer) Complete

## 2022-05-08 NOTE — Progress Notes (Signed)
[facility] Progress Note  Name: Curtis Mays MRN: [id number] DOB: [birth year]  Chief Complaint:  Leg swelling and shortness of breath   Subjective:  he had 1.9 liters UOP over 9/5 charted.  He had a paracentesis on 9/5 with 3.1 liters of fluid removed. He is getting around better.  Has to go urinate now.     Review of systems:   Denies shortness of breath  Denies n/v Denies chest pain  ----------- Background on consult:  Curtis Mays is a 61 y.o. male with a history of CKD, not currently on dialysis type 2 diabetes mellitus, hypertension, CHF, MALT lymphoma, and cirrhosis who presented to the hospital with lower extremity edema and abdominal swelling.  The swelling had worsened over the past month.  He has had some shortness of breath as well.  He has been eating a lot more watermelon because he read online that it could help cirrhosis.  He's had about a watermelon a week.  He previously was on peritoneal dialysis several years ago however was able to come off.  Per his primary nephrologist, Dr. Johnney Ou, he is not sure if he would ever do dialysis again.  He does have a follow-up appt with Dr. Johnney Ou scheduled on 9/14.  Cr 5.26 vs mid to low 4's.  He states he had no swelling when he was last seen in the spring and has been on lasix 80 mg am and 40 mg in the evening since then.  He thinks torsemide worked better and wants to ask Dr. Johnney Ou about switching to this.  He was short of breath when he came in but this is much improved.  He states that his stomach feels softer and less distended as well.    He states that he would never want dialysis again "that's not living; let me die".  He states that he hopes to be around in November for the birth of a grandchild but that he would not want dialysis to make this happen if that situation arose.  RN at bedside during my discussion with him.  No strict ins/outs available.      Intake/Output Summary (Last 24 hours) at 05/08/2022  1046 Last data filed at 05/08/2022 0448 Gross per 24 hour  Intake 240 ml  Output 1850 ml  Net -1610 ml    Vitals:  Vitals:   05/07/22 1517 05/07/22 1723 05/07/22 1926 05/08/22 0300  BP: 129/69 118/61 (!) 111/57   Pulse:  60 63   Resp:   17   Temp:  (!) 97.4 F (36.3 C) (!) 97.5 F (36.4 C)   TempSrc:  Oral Oral   SpO2:   99%   Weight:    94.9 kg  Height:         Physical Exam:    General: adult male in bed in NAD HEENT: NCAT Eyes: EOMI sclera anicteric Neck: supple trachea midline  Heart: S1S2 no rub Lungs: reduced breath sounds and normal work of breathing; on room air  Abdomen: soft but distended; nontender Extremities: 1+ edema lower extremities Skin: no rash on extremities exposed Neuro: alert and oriented x3 provides hx and follows commands Psych normal mood and affect GU no foley   Medications reviewed   Labs:     Latest Ref Rng & Units 05/08/2022    8:50 AM 05/07/2022    2:26 AM 05/05/2022   10:31 PM  BMP  Glucose 70 - 99 mg/dL 171  122    BUN 8 -  23 mg/dL 86  88    Creatinine 0.61 - 1.24 mg/dL 5.00  5.10    Sodium 135 - 145 mmol/L 141  143  145   Potassium 3.5 - 5.1 mmol/L 3.6  3.7  3.8   Chloride 98 - 111 mmol/L 112  112    CO2 22 - 32 mmol/L 20  21    Calcium 8.9 - 10.3 mg/dL 9.2  9.3       Assessment/Plan:   # CKD stage V - No emergent electrolyte abnormalities.  Plan is for medical management as he would not ever want to pursue dialysis again (see above discussion) - diuretics as below - he follows with Dr. Johnney Ou and has close outpatient follow-up on 9/14 set up currently    # Acute on chronic systolic CHF  - Fluid overload - improving  - Will stop IV lasix and resume prior home regimen of lasix 80 mg BID.  Please continue lasix 80 mg PO BID on discharge.  He plans to discuss a possible transition to torsemide with his outpatient nephrologist - discussed fluid and salt restriction    # Cirrhosis - s/p paracentesis as above  - diuretics as  above and have discussed fluid and salt restriction    # Anemia of CKD  - gets EPO through hem/onc per charting    # HTN  - controlled on current regimen    # Thrombocytopenia - felt 2/2 cirrhosis/splenomegaly per hem/onc, Dr. Bobby Rumpf  Disposition - stable for discharge from a renal standpoint.  Nephrology will sign off.  Please do not hesitate to contact me with any questions.  I will ensure he has follow-up with our office and he reports an appt with Dr. Johnney Ou on 9/14    Claudia Desanctis, MD 05/08/2022  10:58 AM

## 2022-05-08 NOTE — Assessment & Plan Note (Addendum)
Presumed coronary artery disease.  No cardiac catheterization in the past.  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  Has been under medical therapy with aspirin, atorvastatin, carvedilol and isosorbide.

## 2022-05-08 NOTE — Progress Notes (Signed)
Explained discharge summary with the patient. Reviewed follow up appointments and next medication administration times. Also gave CHF education. Patient verbalized having an understanding of the instructions. Removed patient's IV and telemetry. CCMD noted. TOC medication and belongings are in the patient's possession. Transported to discharge lounge with belongings while patient await his ride.

## 2022-05-08 NOTE — Discharge Summary (Addendum)
Physician Discharge Summary   Patient: Curtis Mays MRN: [id number] DOB: 01/05/1961  Admit date:     05/05/2022  Discharge date: 05/08/22  Discharge Physician: Jimmy Picket Sarabi Sockwell   PCP: Helen Hashimoto., MD   Recommendations at discharge:    Patient will continue with furosemide 80 mg bid and instructions to take 3 time per day, for 2 to 3 days, in case of volume overload, 2 to 3 lbs weight gain in 24 hrs or 5 lbs in 7 days. Let Dr  Johnney Ou know. Plan to continue holding hydralazine for now to prevent hypotension.  He has been advised about fluid and salt restriction Patient continue to be reluctant to start renal replacement therapy, he wants to keep his current lifestyle and would not like to be on hemodialysis.   Discharge Diagnoses: Principal Problem:   Acute kidney injury superimposed on chronic kidney disease ([place]) Active Problems:   Acute on chronic systolic CHF (congestive heart failure) (HCC)   Cirrhosis (HCC)   DM2 (diabetes mellitus, type 2) (HCC)   HTN (hypertension)   Anemia in chronic kidney disease   Paroxysmal atrial fibrillation (HCC)   Coronary artery disease  Resolved Problems:   * No resolved hospital problems. [facility] Course: Curtis Mays was admitted to the hospital with the working diagnosis of volume overload in the setting of CKD stage 54.   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 Reported 4 weeks of lower extremity edema and abdominal swelling along with orthopnea and dyspnea. 10 lbs weight gain. At home patient has been eating about one watermelon per week, along with diet indiscretion with no salt restricted diet.   On his initial physical examination his blood pressure 123/60, HR 66, RR 16 and 02 saturation 97%, lungs with expiratory wheezing, heart with S1 and S2 present and rhythmic, abdomen with distention, positive  bilateral lower extremity edema +++ pitting.   Na 145, K 3,8 Cl 109, bicarbonate 20 glucose 100 bun 87 cr 5.,26  BNP 1,548  High sensitive troponin 36  Wbc 4,5 hgb 10.1 plt 71   Chest radiograph with bilateral hilar vascular congestion, interstitial infiltrates more at lower lobes and bilateral small pleural effusions.   EKG 63 bpm, right axis, qtc 476, 1st degree AV block, sinus rhythm with no significant ST segment or T wave changes.    Patient was placed on IV furosemide 80 mg q12 hr with improvement in volume status. Patient will continue oral furosemide at the time of his discharge and will follow up with nephrology as outpatient.  Advices about salt and fluid restriction.   Assessment and Plan: * Acute kidney injury superimposed on chronic kidney disease (HCC) CKD stage 5. Hyper P  Patient was placed on IV furosemide for diuresis, negative fluid balance was achieved, - Plan to continue with IV furosemide to further target negative fluid balance, loosing 7 kg since admission with significant improvement in his symptoms.   Patient did not require renal replacement therapy during this hospitalization. Plan to continue furosemide for diuresis.  Follow up renal function as outpatient, His antihypertensive medications were held during his hospitalization, at the time of discharge will continue to hold on hydralazine to prevent hypotension.   At the time of his discharge his renal function had a serum cr of 5,0 with K at 3,6 and serum bicarbonate of 20.  P 5,2  Added phosphate binders.   Acute on chronic systolic CHF (congestive heart failure) (HCC) Echocardiogram with reduced LV systolic function EF 30 to 35%, global hypokinesis. RV systolic function is preserved. RVSP 59,4 mmHg. No significant valvular disease.  Echocardiogram from 10/2021 with mild reduction in LV systolic function 45 to 42% with global hypokinesis.   Patient will resume carvedilol and isosorbide at the time of  discharge.   Limited pharmacologic options due to reduced LV systolic function. Not candidate for cardiac catheterization due to low GFR and risk of worsening renal failure.   Plan to continue medical therapy along with lifestyle modifications and follow up echocardiogram as outpatient.   Lower extremities [place] negative for deep vein thrombosis.   Cirrhosis ([place]) Cirrhosis was a incidental finding as outpatient, during work for thrombocytopenia. No signs of decompensated liver disease.  [place] with positive ascites right and left lower quadrants.   HTN (hypertension) At the time of discharge his systolic blood pressure has been 111 to 121 mmHg. Plan to resume carvedilol and isosorbide Continue holding hydralazine for now.   DM2 (diabetes mellitus, type 2) ([place]) His glucose remained stable during his hospitalization. He did receive insulin sliding scale for glucose cover and monitoring.   Anemia in chronic kidney disease Getting erythropoetin injections with heme/onc  Paroxysmal atrial fibrillation [facility]) Patient will continue with amiodarone Currently is not on anticoagulation. Follows with cardiology in [facility].   Coronary artery disease Presumed coronary artery disease.  No cardiac catheterization in the past.  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  Has been under medical therapy with aspirin, atorvastatin, carvedilol and isosorbide.          Consultants: nephrology  Procedures performed:non   Disposition: Home Diet recommendation:  Cardiac and Carb modified diet DISCHARGE MEDICATION: Allergies as of 05/08/2022   No Known Allergies      Medication List     STOP taking these medications    hydrALAZINE 25 MG tablet Commonly known as: APRESOLINE       TAKE these medications    allopurinol 100 MG tablet Commonly known as: ZYLOPRIM Take 100 mg by mouth daily.   amiodarone 200 MG  tablet Commonly known as: PACERONE Take 200 mg by mouth daily.   aspirin EC 81 MG tablet Take 1 tablet (81 mg total) by mouth daily.   atorvastatin 40 MG tablet Commonly known as: LIPITOR Take 1 tablet (40 mg total) by mouth daily at 6 PM.   calcium acetate 667 MG capsule Commonly known as: PHOSLO Take 1 capsule (667 mg total) by mouth 3 (three) times daily with meals.   carvedilol 6.25 MG tablet Commonly known as: COREG Take 6.25 mg by mouth in the morning and at bedtime.   Cholecalciferol 50 MCG (2000 UT) Caps Take 2,000 Units by mouth daily.   furosemide 20 MG tablet Commonly known as: LASIX Take 80 mg by mouth 2 (two) times daily.   HumaLOG 100 UNIT/ML injection Generic drug: insulin lispro Inject 0-15 Units into the skin See admin instructions. Per sliding scale with meals (about 3 times a day)   isosorbide mononitrate 60 MG 24 hr tablet Commonly known as: IMDUR Take 60 mg by mouth daily.   oxyCODONE 5 MG immediate release tablet Commonly known as: Oxy IR/ROXICODONE Take 5 mg by mouth 2 (two) times daily as needed for pain.   tamsulosin 0.4 MG Caps capsule Commonly known as: FLOMAX Take 0.4  mg by mouth at bedtime.   zolpidem 10 MG tablet Commonly known as: AMBIEN Take 10 mg by mouth at bedtime.        Follow-up Information     [facility]. Go in 13 day(s).   Specialty: Cardiology Why: Hospital follow up PLEASE bring a current medication list to appointment FREE valet parking, Entrance C, off [employer] information: [address] [id number] [place] [phone]               Discharge Exam: Danley Danker Weights   05/06/22 0112 05/07/22 0022 05/08/22 0300  Weight: 101.2 kg 99.5 kg 94.9 kg   BP (!) 111/57 (BP Location: Right Arm)   Pulse 63   Temp (!) 97.5 F (36.4 C) (Oral)   Resp 17   Ht 5\' 11"  (1.803 m)   Wt 94.9 kg   SpO2 99%   BMI 29.18 kg/m    Patient is feeling better, no dyspnea and edema has improved  Neurology awake and alert EnT with mild pallor Cardiovascular with S1 and S2 present and rhythmic with no gallops Respiratory with no rales or wheezing Abdomen with no distention  Positive lower extremity edema ++ on the left and +++ on the right.   Condition at discharge: stable  The results of significant diagnostics from this hospitalization (including imaging, microbiology, ancillary and laboratory) are listed below for reference.   Imaging Studies: IR Paracentesis  Result Date: 05/07/2022 INDICATION: Chronic renal insufficiency. Abdominal distention and ascites. Request for therapeutic paracentesis. EXAM: ULTRASOUND GUIDED LEFT LOWER QUADRANT PARACENTESIS MEDICATIONS: 1% plain lidocaine, 5 mL COMPLICATIONS: None immediate. PROCEDURE: Informed written consent was obtained from the patient after a discussion of the risks, benefits and alternatives to treatment. A timeout was performed prior to the initiation of the procedure. Initial ultrasound scanning demonstrates a large amount of ascites within the left lower abdominal quadrant. The left lower abdomen was prepped and draped in the usual sterile fashion. 1% lidocaine was used for local anesthesia. Following this, a 19 gauge, 7-cm, Yueh catheter was introduced. An ultrasound image was saved for documentation purposes. The paracentesis was performed. The catheter was removed and a dressing was applied. The patient tolerated the procedure well without immediate post procedural complication. FINDINGS: A total of approximately 3.1 L of hazy yellow fluid was removed. IMPRESSION: Successful ultrasound-guided paracentesis yielding 3.1 liters of peritoneal fluid. Read by: Ascencion Dike PA-C Electronically Signed   By: Aletta Edouard M.D.   On: 05/07/2022 15:34   [place] ASCITES (ABDOMEN LIMITED)  Result Date: 05/07/2022 CLINICAL DATA:  Ascites EXAM: LIMITED ABDOMEN ULTRASOUND FOR ASCITES  TECHNIQUE: Limited ultrasound survey for ascites was performed in all four abdominal quadrants. COMPARISON:  None Available. FINDINGS: There is ascites present in the right and left lower quadrants. IMPRESSION: Positive for ascites in the right and left lower quadrants. Electronically Signed   By: Maurine Simmering M.D.   On: 05/07/2022 11:11   ECHOCARDIOGRAM COMPLETE  Result Date: 05/06/2022    ECHOCARDIOGRAM REPORT   Patient Name:   Curtis Mays Date of Exam: 05/06/2022 Medical Rec #:  [id number]      Height:       71.0 in Accession #:    [id number]     Weight:       223.1 lb Date of Birth:  [birth year]      BSA:          2.209 m Patient Age:  61 years       BP:           126/73 mmHg Patient Gender: M              HR:           65 bpm. Exam Location:  Inpatient Procedure: 2D Echo Indications:    acute systolic chf  History:        Patient has prior history of Echocardiogram examinations. CHF,                 chronic kidney disease. Cirrhosis.; Risk Factors:Diabetes.  Sonographer:    Johny Chess RDCS Referring Phys: [phone] JARED M GARDNER  Sonographer Comments: Technically difficult study due to poor echo windows. IMPRESSIONS  1. Left ventricular ejection fraction, by estimation, is 30 to 35%. The left ventricle has moderately decreased function. Left ventricular endocardial border not optimally defined to evaluate regional wall motion, global hypokinesis noted. Left ventricular diastolic parameters are consistent with Grade III diastolic dysfunction (restrictive filling).  2. Right ventricular systolic function is normal. The right ventricular size is mildly enlarged. There is moderately elevated pulmonary artery systolic pressure. The estimated right ventricular systolic pressure is 02.6 mmHg.  3. Left atrial size was mildly dilated.  4. The mitral valve is degenerative. Trivial mitral valve regurgitation. No evidence of mitral stenosis.  5. The aortic valve is grossly normal. There is mild calcification of the  aortic valve. Aortic valve regurgitation is not visualized. No aortic stenosis is present.  6. The inferior vena cava is dilated in size with <50% respiratory variability, suggesting right atrial pressure of 15 mmHg. FINDINGS  Left Ventricle: Left ventricular ejection fraction, by estimation, is 30 to 35%. The left ventricle has moderately decreased function. Left ventricular endocardial border not optimally defined to evaluate regional wall motion. The left ventricular internal cavity size was normal in size. There is no left ventricular hypertrophy. Left ventricular diastolic parameters are consistent with Grade III diastolic dysfunction (restrictive). Right Ventricle: The right ventricular size is mildly enlarged. No increase in right ventricular wall thickness. Right ventricular systolic function is normal. There is moderately elevated pulmonary artery systolic pressure. The tricuspid regurgitant velocity is 3.33 m/s, and with an assumed right atrial pressure of 15 mmHg, the estimated right ventricular systolic pressure is 37.8 mmHg. Left Atrium: Left atrial size was mildly dilated. Right Atrium: Right atrial size was normal in size. Pericardium: There is no evidence of pericardial effusion. Mitral Valve: The mitral valve is degenerative in appearance. Mild mitral annular calcification. Trivial mitral valve regurgitation. No evidence of mitral valve stenosis. The mean mitral valve gradient is 1.8 mmHg. Tricuspid Valve: The tricuspid valve is normal in structure. Tricuspid valve regurgitation is trivial. No evidence of tricuspid stenosis. Aortic Valve: The aortic valve is grossly normal. There is mild calcification of the aortic valve. Aortic valve regurgitation is not visualized. No aortic stenosis is present. Pulmonic Valve: The pulmonic valve was not well visualized. Pulmonic valve regurgitation is trivial. No evidence of pulmonic stenosis. Aorta: The aortic root is normal in size and structure. Venous: The  inferior vena cava is dilated in size with less than 50% respiratory variability, suggesting right atrial pressure of 15 mmHg. IAS/Shunts: No atrial level shunt detected by color flow Doppler.  LEFT VENTRICLE PLAX 2D LVIDd:         5.80 cm   Diastology LVIDs:         4.80 cm   LV e' medial:  4.35 cm/s LV PW:         0.90 cm   LV E/e' medial:  25.3 LV IVS:        0.90 cm   LV e' lateral:   8.81 cm/s LVOT diam:     1.90 cm   LV E/e' lateral: 12.5 LV SV:         49 LV SV Index:   22 LVOT Area:     2.84 cm  RIGHT VENTRICLE            IVC RV S prime:     9.03 cm/s  IVC diam: 3.20 cm TAPSE (M-mode): 1.8 cm LEFT ATRIUM             Index        RIGHT ATRIUM           Index LA diam:        4.30 cm 1.95 cm/m   RA Area:     18.20 cm LA Vol (A2C):   83.6 ml 37.85 ml/m  RA Volume:   56.60 ml  25.62 ml/m LA Vol (A4C):   58.8 ml 26.62 ml/m LA Biplane Vol: 69.8 ml 31.60 ml/m  AORTIC VALVE LVOT Vmax:   79.70 cm/s LVOT Vmean:  52.100 cm/s LVOT VTI:    0.172 m  AORTA Ao Root diam: 3.40 cm Ao Asc diam:  3.40 cm MITRAL VALVE                TRICUSPID VALVE MV Area (PHT): 4.49 cm     TR Peak grad:   44.4 mmHg MV Mean grad:  1.8 mmHg     TR Vmax:        333.00 cm/s MV Decel Time: 169 msec MV E velocity: 110.00 cm/s  SHUNTS MV A velocity: 48.00 cm/s   Systemic VTI:  0.17 m MV E/A ratio:  2.29         Systemic Diam: 1.90 cm Cherlynn Kaiser MD Electronically signed by Cherlynn Kaiser MD Signature Date/Time: 05/06/2022/2:25:57 PM    Final    VAS [place] LOWER EXTREMITY VENOUS (DVT)  Result Date: 05/06/2022  Lower Venous DVT Study Patient Name:  Curtis Mays  Date of Exam:   05/06/2022 Medical Rec #: [id number]       Accession #:    [id number] Date of Birth: 1961/06/18       Patient Gender: M Patient Age:   57 years Exam Location:  [facility] Procedure:      VAS [place] LOWER EXTREMITY VENOUS (DVT) Referring Phys: Sander Radon --------------------------------------------------------------------------------  Indications: Edema.   Comparison Study: No prior studies. Performing Technologist: Darlin Coco RDMS, RVT  Examination Guidelines: A complete evaluation includes B-mode imaging, spectral Doppler, color Doppler, and power Doppler as needed of all accessible portions of each vessel. Bilateral testing is considered an integral part of a complete examination. Limited examinations for reoccurring indications may be performed as noted. The reflux portion of the exam is performed with the patient in reverse Trendelenburg.  +---------+---------------+---------+-----------+----------+--------------+ RIGHT    CompressibilityPhasicitySpontaneityPropertiesThrombus Aging +---------+---------------+---------+-----------+----------+--------------+ CFV      Full           Yes      Yes                                 +---------+---------------+---------+-----------+----------+--------------+ SFJ      Full                                                        +---------+---------------+---------+-----------+----------+--------------+  FV Prox  Full                                                        +---------+---------------+---------+-----------+----------+--------------+ FV Mid   Full                                                        +---------+---------------+---------+-----------+----------+--------------+ FV DistalFull                                                        +---------+---------------+---------+-----------+----------+--------------+ PFV      Full                                                        +---------+---------------+---------+-----------+----------+--------------+ POP      Full           Yes      Yes                                 +---------+---------------+---------+-----------+----------+--------------+ PTV      Full                                                        +---------+---------------+---------+-----------+----------+--------------+ PERO      Full                                                        +---------+---------------+---------+-----------+----------+--------------+   +---------+---------------+---------+-----------+----------+--------------+ LEFT     CompressibilityPhasicitySpontaneityPropertiesThrombus Aging +---------+---------------+---------+-----------+----------+--------------+ CFV      Full           Yes      Yes                                 +---------+---------------+---------+-----------+----------+--------------+ SFJ      Full                                                        +---------+---------------+---------+-----------+----------+--------------+ FV Prox  Full                                                        +---------+---------------+---------+-----------+----------+--------------+  FV Mid   Full                                                        +---------+---------------+---------+-----------+----------+--------------+ FV DistalFull                                                        +---------+---------------+---------+-----------+----------+--------------+ PFV      Full                                                        +---------+---------------+---------+-----------+----------+--------------+ POP      Full           Yes      Yes                                 +---------+---------------+---------+-----------+----------+--------------+ PTV      Full                                                        +---------+---------------+---------+-----------+----------+--------------+ PERO     Full                                                        +---------+---------------+---------+-----------+----------+--------------+     Summary: RIGHT: - There is no evidence of deep vein thrombosis in the lower extremity.  - No cystic structure found in the popliteal fossa.  LEFT: - There is no evidence of deep vein thrombosis in the  lower extremity.  - No cystic structure found in the popliteal fossa.  *See table(s) above for measurements and observations. Electronically signed by Monica Martinez MD on 05/06/2022 at 2:21:43 PM.    Final    DG Chest 2 View  Result Date: 05/05/2022 CLINICAL DATA:  Dyspnea EXAM: CHEST - 2 VIEW COMPARISON:  03/24/2020 FINDINGS: The lungs are symmetrically well expanded. Mild bilateral perihilar interstitial pulmonary infiltrate has developed, airway inflammation versus trace perihilar pulmonary edema. Small bilateral pleural effusions are present, right greater than left. No pneumothorax. Cardiac size within normal limits. No acute bone abnormality. IMPRESSION: Interval development of mild perihilar interstitial pulmonary infiltrate, airway inflammation versus trace perihilar pulmonary edema. Small bilateral pleural effusions. Electronically Signed   By: Fidela Salisbury M.D.   On: 05/05/2022 22:08    Microbiology: Results for orders placed or performed during the hospital encounter of 03/24/20  SARS Coronavirus 2 by RT PCR (hospital order, performed in [facility] hospital lab) Nasopharyngeal Nasopharyngeal Swab     Status: None   Collection Time: 03/24/20  4:37 PM   Specimen: Nasopharyngeal Swab  Result Value Ref Range  Status   SARS Coronavirus 2 NEGATIVE NEGATIVE Final    Comment: (NOTE) SARS-CoV-2 target nucleic acids are NOT DETECTED.  The SARS-CoV-2 RNA is generally detectable in upper and lower respiratory specimens during the acute phase of infection. The lowest concentration of SARS-CoV-2 viral copies this assay can detect is 250 copies / mL. A negative result does not preclude SARS-CoV-2 infection and should not be used as the sole basis for treatment or other patient management decisions.  A negative result may occur with improper specimen collection / handling, submission of specimen other than nasopharyngeal swab, presence of viral mutation(s) within the areas targeted by this  assay, and inadequate number of viral copies (<250 copies / mL). A negative result must be combined with clinical observations, patient history, and epidemiological information.  Fact Sheet for Patients:   StrictlyIdeas.no  Fact Sheet for Healthcare Providers: BankingDealers.co.za  This test is not yet approved or  cleared by the [place] FDA and has been authorized for detection and/or diagnosis of SARS-CoV-2 by FDA under an Emergency Use Authorization (EUA).  This EUA will remain in effect (meaning this test can be used) for the duration of the COVID-19 declaration under Section 564(b)(1) of the Act, 21 U.S.C. section 360bbb-3(b)(1), unless the authorization is terminated or revoked sooner.  Performed at [facility], [place] [address], [place], [place] [address]     Labs: CBC: Recent Labs  Lab 05/05/22 2133 05/05/22 2231  WBC 4.5  --   NEUTROABS 3.2  --   HGB 10.1* 9.5*  HCT 29.4* 28.0*  MCV 102.4*  --   PLT 71*  --    Basic Metabolic Panel: Recent Labs  Lab 05/05/22 2133 05/05/22 2231 05/07/22 0226 05/08/22 0850  NA 145 145 143 141  K 3.8 3.8 3.7 3.6  CL 109  --  112* 112*  CO2 20*  --  21* 20*  GLUCOSE 100*  --  122* 171*  BUN 87*  --  88* 86*  CREATININE 5.26*  --  5.10* 5.00*  CALCIUM 9.6  --  9.3 9.2  MG  --   --  2.5* 2.4  PHOS  --   --  5.2*  --    Liver Function Tests: Recent Labs  Lab 05/05/22 2133  AST 34  ALT 26  ALKPHOS 78  BILITOT 1.0  PROT 6.9  ALBUMIN 3.4*   CBG: Recent Labs  Lab 05/06/22 1158 05/06/22 1635 05/06/22 2118 05/07/22 0625 05/08/22 1105  GLUCAP 116* 135* 130* 105* 134*    Discharge time spent: greater than 30 minutes.  Signed: Tawni Millers, MD [facility] 05/08/2022

## 2022-05-16 DIAGNOSIS — I208 Other forms of angina pectoris: Secondary | ICD-10-CM | POA: Diagnosis not present

## 2022-05-16 DIAGNOSIS — I509 Heart failure, unspecified: Secondary | ICD-10-CM | POA: Diagnosis not present

## 2022-05-16 DIAGNOSIS — N184 Chronic kidney disease, stage 4 (severe): Secondary | ICD-10-CM | POA: Diagnosis not present

## 2022-05-16 DIAGNOSIS — N189 Chronic kidney disease, unspecified: Secondary | ICD-10-CM | POA: Diagnosis not present

## 2022-05-16 DIAGNOSIS — N2581 Secondary hyperparathyroidism of renal origin: Secondary | ICD-10-CM | POA: Diagnosis not present

## 2022-05-16 DIAGNOSIS — I129 Hypertensive chronic kidney disease with stage 1 through stage 4 chronic kidney disease, or unspecified chronic kidney disease: Secondary | ICD-10-CM | POA: Diagnosis not present

## 2022-05-16 DIAGNOSIS — K7460 Unspecified cirrhosis of liver: Secondary | ICD-10-CM | POA: Diagnosis not present

## 2022-05-16 DIAGNOSIS — E1122 Type 2 diabetes mellitus with diabetic chronic kidney disease: Secondary | ICD-10-CM | POA: Diagnosis not present

## 2022-05-19 NOTE — Progress Notes (Signed)
HEART & VASCULAR TRANSITION OF CARE CONSULT NOTE     Referring Physician: Primary Care: Primary Cardiologist:  HPI: Referred to clinic by *** for heart failure consultation. 61 y.o. 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 He follows with Cardiology at [facility].  Afib diagnosed in April 2018 during episode of viral gastroenteritis. Underwent DCCV in April 2018. He stopped anticoagulation several years ago d/t cost issues per chart review.  EF 45% on echo in September 2018.  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 He was managed medically. Had CP and mildly elevated troponin while admitted to [facility] March 2021. He was severely hypertensive. No prior coronary angiogram due to renal impairment.  Echo 03/21: EF 55-60%, RV okay, mild MR, mild TR, IVC dilated  Echo 03/23: EF 45-50% with mild global hypokinesis of LV, mild LVH, RV cannot be obsessed d/t poor image quality, moderate LAE, mild MR  He was admitted 09/03-09/06/23 with a/c CHF and AKI on CKD. Echo during admit: EF 30-35%, grade III DD, RV okay, RVSP 59 mmHg, dilated IVC. Had not been following fluid or sodium restriction. Diuresed with IV lasix. Underwent paracentesis for ascites with 3.1 L fluid removed. GDMT limited by renal function and soft BP.  Seen by Nephrology and reported he would not ever want to pursue HD again.  Cardiac Testing    Review of Systems: [y] = yes, [ ]  = no   General: Weight gain [ ] ; Weight loss [ ] ; Anorexia [ ] ; Fatigue [ ] ; Fever [ ] ; Chills [ ] ; Weakness [ ]   Cardiac: Chest pain/pressure [ ] ; Resting SOB [ ] ; Exertional SOB [ ] ; Orthopnea [ ] ; Pedal Edema [ ] ; Palpitations [ ] ; Syncope [ ] ; Presyncope [ ] ; Paroxysmal nocturnal dyspnea[ ]   Pulmonary: Cough [ ] ; Wheezing[ ] ;  Hemoptysis[ ] ; Sputum [ ] ; Snoring [ ]   GI: Vomiting[ ] ; Dysphagia[ ] ; Melena[ ] ; Hematochezia [ ] ; Heartburn[ ] ; Abdominal pain [ ] ; Constipation [ ] ; Diarrhea [ ] ; BRBPR [ ]   GU: Hematuria[ ] ; Dysuria [ ] ; Nocturia[ ]   Vascular: Pain in legs with walking [ ] ; Pain in feet with lying flat [ ] ; Non-healing sores [ ] ; Stroke [ ] ; TIA [ ] ; Slurred speech [ ] ;  Neuro: Headaches[ ] ; Vertigo[ ] ; Seizures[ ] ; Paresthesias[ ] ;Blurred vision [ ] ; Diplopia [ ] ; Vision changes [ ]   Ortho/Skin: Arthritis [ ] ; Joint pain [ ] ; Muscle pain [ ] ; Joint swelling [ ] ; Back Pain [ ] ; Rash [ ]   Psych: Depression[ ] ; Anxiety[ ]   Heme: Bleeding problems [ ] ; Clotting disorders [ ] ; Anemia [ ]   Endocrine: Diabetes [ ] ; Thyroid dysfunction[ ]    Past Medical History:  Diagnosis Date   Aortic calcification (HCC)    Arthritis    CHF (congestive heart failure) (HCC)    Chronic pain syndrome    CKD (chronic kidney disease), stage IV (HCC)    Diabetes mellitus    DM2 (diabetes mellitus, type 2) (HCC)    uncontrolled   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     amiodarone (PACERONE) 200 MG tablet Take 200 mg by mouth daily.     aspirin EC 81 MG EC tablet Take 1 tablet (81 mg total) by mouth daily. 30 tablet 1   atorvastatin (LIPITOR) 40 MG tablet Take 1 tablet (40 mg total) by mouth daily at 6 PM. 30 tablet 1   calcium acetate (PHOSLO) 667 MG capsule Take 1 capsule (667 mg total) by mouth 3 (three) times daily  with meals. 90 capsule 0   carvedilol (COREG) 6.25 MG tablet Take 6.25 mg by mouth in the morning and at bedtime.     Cholecalciferol 50 MCG (2000 UT) CAPS Take 2,000 Units by mouth daily.     furosemide (LASIX) 20 MG tablet Take 80 mg by mouth 2 (two) times daily.     insulin lispro (HUMALOG) 100 UNIT/ML injection Inject 0-15 Units into the skin See admin instructions. Per sliding scale with meals (about 3 times a day)     isosorbide mononitrate (IMDUR) 60 MG 24 hr tablet Take 60 mg by mouth daily.     oxyCODONE (OXY IR/ROXICODONE) 5 MG immediate release tablet Take 5 mg by mouth 2 (two) times daily as needed for pain.     Tamsulosin HCl (FLOMAX) 0.4 MG CAPS Take 0.4 mg by mouth at bedtime.      zolpidem (AMBIEN) 10 MG tablet Take 10 mg by mouth at bedtime.     No current facility-administered medications for this visit.    No Known Allergies    Social History   Socioeconomic History   Marital status: Married    Spouse name: JUDY   Number of children: 2   Years of education: 10   Highest education level: Not on file  Occupational History   Occupation: RETIRED - [employer]  Tobacco Use   Smoking status: Former    Years: 10.00    Types: Cigarettes    Quit date: 09/02/1988    Years since quitting: 33.7   Smokeless tobacco: Never  Vaping Use   Vaping Use: Never used  Substance and Sexual Activity   Alcohol use: No   Drug use: Never   Sexual activity: Not Currently  Other Topics Concern   Not on file  Social History Narrative   Not on file   Social Determinants of Health   Financial Resource Strain: Low Risk  (05/07/2022)   Overall Financial Resource Strain (CARDIA)    Difficulty of Paying Living Expenses: Not hard at all  Food Insecurity: No Food Insecurity (05/07/2022)   Hunger Vital Sign    Worried About Running Out of Food in the Last Year: Never true    [place] in the Last Year: Never true  Transportation Needs: No Transportation Needs (05/07/2022)    PRAPARE - Hydrologist (Medical): No    Lack of Transportation (Non-Medical): No  Physical Activity: Not on file  Stress: Not on file  Social Connections: Not on file  Intimate Partner Violence: Not on file      Family History  Problem Relation Age of Onset   Heart disease Mother    COPD Mother    Stroke Father  Heart disease Father    Melanoma Maternal Uncle    Breast cancer Maternal Uncle    Breast cancer Paternal Aunt    Bone cancer Paternal Aunt    Prostate cancer Paternal Uncle    Pancreatic cancer Paternal Uncle    Thyroid cancer Child     There were no vitals filed for this visit.  PHYSICAL EXAM: General:  Well appearing. No respiratory difficulty HEENT: normal Neck: supple. no JVD. Carotids 2+ bilat; no bruits. No lymphadenopathy or thryomegaly appreciated. Cor: PMI nondisplaced. Regular rate & rhythm. No rubs, gallops or murmurs. Lungs: clear Abdomen: soft, nontender, nondistended. No hepatosplenomegaly. No bruits or masses. Good bowel sounds. Extremities: no cyanosis, clubbing, rash, edema Neuro: alert & oriented x 3, cranial nerves grossly intact. moves all 4 extremities w/o difficulty. Affect pleasant.  ECG:   ASSESSMENT & PLAN: Chronic systolic CHF  Paroxysmal atrial fibrillation  CKD V  HTN  Presumed CAD    NYHA *** GDMT  Diuretic- BB- Ace/ARB/ARNI MRA SGLT2i    Referred to HFSW (PCP, Medications, Transportation, ETOH Abuse, Drug Abuse, Insurance, Financial ): Yes or No Refer to Pharmacy: Yes or No Refer to Home Health: Yes on No Refer to Advanced Heart Failure Clinic: Yes or no  Refer to General Cardiology: Yes or No  Follow up

## 2022-05-20 ENCOUNTER — Encounter (HOSPITAL_COMMUNITY): Payer: Self-pay

## 2022-05-20 ENCOUNTER — Ambulatory Visit (HOSPITAL_COMMUNITY)
Admit: 2022-05-20 | Discharge: 2022-05-20 | Disposition: A | Discharge status: Home / Self Care | Payer: Medicare Other | Attending: Physician Assistant | Admitting: Physician Assistant

## 2022-05-20 ENCOUNTER — Telehealth (HOSPITAL_COMMUNITY): Payer: Self-pay | Admitting: *Deleted

## 2022-05-20 VITALS — BP 106/56 | HR 60 | Wt 196.4 lb

## 2022-05-20 DIAGNOSIS — I2510 Atherosclerotic heart disease of native coronary artery without angina pectoris: Secondary | ICD-10-CM | POA: Diagnosis not present

## 2022-05-20 DIAGNOSIS — N185 Chronic kidney disease, stage 5: Secondary | ICD-10-CM

## 2022-05-20 DIAGNOSIS — I5022 Chronic systolic (congestive) heart failure: Secondary | ICD-10-CM | POA: Diagnosis not present

## 2022-05-20 DIAGNOSIS — I10 Essential (primary) hypertension: Secondary | ICD-10-CM

## 2022-05-20 DIAGNOSIS — N189 Chronic kidney disease, unspecified: Secondary | ICD-10-CM | POA: Insufficient documentation

## 2022-05-20 DIAGNOSIS — K7460 Unspecified cirrhosis of liver: Secondary | ICD-10-CM | POA: Insufficient documentation

## 2022-05-20 DIAGNOSIS — Z7901 Long term (current) use of anticoagulants: Secondary | ICD-10-CM | POA: Insufficient documentation

## 2022-05-20 DIAGNOSIS — I110 Hypertensive heart disease with heart failure: Secondary | ICD-10-CM | POA: Diagnosis not present

## 2022-05-20 DIAGNOSIS — I480 Paroxysmal atrial fibrillation: Secondary | ICD-10-CM

## 2022-05-20 DIAGNOSIS — E1140 Type 2 diabetes mellitus with diabetic neuropathy, unspecified: Secondary | ICD-10-CM | POA: Diagnosis not present

## 2022-05-20 DIAGNOSIS — D696 Thrombocytopenia, unspecified: Secondary | ICD-10-CM | POA: Diagnosis not present

## 2022-05-20 DIAGNOSIS — D631 Anemia in chronic kidney disease: Secondary | ICD-10-CM | POA: Insufficient documentation

## 2022-05-20 MED ORDER — HYDRALAZINE HCL 10 MG PO TABS: 10.0000 mg | ORAL_TABLET | Freq: Three times a day (TID) | ORAL | 2 refills | Status: AC

## 2022-05-20 MED ORDER — ISOSORBIDE MONONITRATE ER 30 MG PO TB24: 30.0000 mg | ORAL_TABLET | Freq: Every day | ORAL | 2 refills | Status: DC

## 2022-05-20 NOTE — Telephone Encounter (Signed)
Called to confirm Heart & Vascular Transitions of Care appointment at 9 am on 05/20/2022. Patient reminded to bring all medications and pill box organizer with them. Confirmed patient has transportation. Gave directions, instructed to utilize [place] parking.  Confirmed appointment prior to ending call.    Earnestine Leys, BSN, Clinical cytogeneticist Only

## 2022-05-20 NOTE — Patient Instructions (Signed)
Medication Changes:  Decrease Imdur to 30 mg Daily (can cut your 60 mg tabs in half, when you run out a new prescription for 30 mg tabs has been sent to your pharmacy)  START Hydralazine 10 mg Three times a day   Lab Work:  none  Testing/Procedures:  none  Referrals:  You have been referred to [place] to establish care, they will call you for an appointment  Special Instructions // Education:  Do the following things EVERYDAY: Weigh yourself in the morning before breakfast. Write it down and keep it in a log. Take your medicines as prescribed Eat low salt foods--Limit salt (sodium) to 2000 mg per day.  Stay as active as you can everyday Limit all fluids for the day to less than 2 liters   Follow-Up in: Thank you for allowing [place] to provider your heart failure care after your recent hospitalization. Please follow-up with [place] again in 2 weeks.  If you have any questions, issues, or concerns before your next appointment please call our office at [phone], opt. 2 and leave a message for the triage nurse.

## 2022-06-03 ENCOUNTER — Ambulatory Visit (HOSPITAL_COMMUNITY): Payer: Medicare Other

## 2022-06-03 NOTE — Progress Notes (Signed)
HEART IMPACT TRANSITIONS OF CARE    PCP: Dr Megan Salon Primary Cardiologist: New Patient Dr Marisue Ivan Nephrology: Dr Johnney Ou  HPI: 61 y.o. 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 He follows with Cardiology at [facility].   Afib diagnosed in April 2018 during episode of viral gastroenteritis. Underwent DCCV in April 2018. He stopped anticoagulation several years ago d/t cost issues per chart review.   EF 45% on echo in September 2018.   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 He was managed medically. Had CP and mildly elevated troponin while admitted to [facility] March 2021. He was severely hypertensive. No prior coronary angiogram due to renal impairment.   Echo 03/21: EF 55-60%, RV okay, mild MR, mild TR, IVC dilated   Echo 03/23: EF 45-50% with mild global hypokinesis of LV, mild LVH, RV cannot be obsessed d/t poor image quality, moderate LAE, mild MR   He was admitted 09/03-09/06/23 with a/c CHF and AKI on CKD. Echo during admit: EF 30-35%, grade III DD, RV okay, RVSP 59 mmHg, dilated IVC. Had not been following fluid or sodium restriction. Diuresed with IV lasix. Underwent paracentesis for ascites with 3.1 L fluid removed. GDMT limited by renal function and soft BP.  Seen by Nephrology and reported he would not ever want to pursue HD again.  He was seen in HF [facility] clinic 2 weeks ago. Imdur was cut back to 30 mg daily due to soft BP.  Fluid looked ok on lasix 160 mg twice a day.   Today he returns for follow up. Overall feeling fine. Able to walk around the grocery store. SOB with inclines. Denies PND/Orthopnea. Ongoing cough but improved once he started tessalon. Appetite ok. No fever or chills. Weight at home dwon from 190-->182  nephrology.    ROS: All systems negative  except as listed in HPI, PMH and Problem List.  SH:  Social History   Socioeconomic History   Marital status: Married    Spouse name: JUDY   Number of children: 2   Years of education: 10   Highest education level: Not on file  Occupational History   Occupation: RETIRED - [employer]  Tobacco Use   Smoking status: Former    Years: 10.00    Types: Cigarettes    Quit date: 09/02/1988    Years since quitting: 33.7   Smokeless tobacco: Never  Vaping Use   Vaping Use: Never used  Substance and Sexual Activity   Alcohol use: No   Drug use: Never   Sexual activity: Not Currently  Other Topics Concern   Not on file  Social History Narrative   Not on file   Social Determinants of Health   Financial Resource Strain: Low Risk  (05/07/2022)   Overall Financial Resource Strain (CARDIA)    Difficulty of Paying Living Expenses: Not hard at all  Food Insecurity: No Food Insecurity (05/07/2022)   Hunger Vital Sign    Worried About Running Out of Food in the Last Year: Never true    [place] in the Last Year: Never true  Transportation Needs: No Transportation Needs (05/07/2022)   PRAPARE - Hydrologist (Medical): No    Lack of Transportation (Non-Medical): No  Physical Activity: Not on file  Stress: Not on file  Social Connections: Not on file  Intimate Partner Violence: Not on file    FH:  Family History  Problem Relation Age of Onset   Heart disease Mother    COPD Mother    Stroke Father    Heart disease Father    Melanoma Maternal Uncle    Breast cancer Maternal Uncle    Breast cancer Paternal Aunt    Bone cancer Paternal Aunt    Prostate cancer Paternal Uncle    Pancreatic cancer Paternal Uncle    Thyroid cancer Child     Past Medical History:  Diagnosis Date   Aortic calcification (HCC)    Arthritis    CHF (congestive heart failure) (HCC)    Chronic pain syndrome    CKD (chronic kidney disease), stage IV (HCC)    Diabetes  mellitus    DM2 (diabetes mellitus, type 2) ([place])    uncontrolled   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     amiodarone (PACERONE) 200 MG tablet Take 200 mg by mouth daily.     aspirin EC 81 MG EC tablet Take 1 tablet (81 mg total) by mouth daily. 30 tablet 1   atorvastatin (LIPITOR) 40 MG tablet Take 1 tablet (40 mg total) by mouth daily at 6 PM. 30 tablet 1   calcium acetate (PHOSLO) 667 MG capsule Take 1 capsule (667 mg total) by mouth 3 (three) times daily with meals. 90 capsule 0   carvedilol (COREG) 6.25 MG tablet Take 6.25 mg by mouth in the morning and at bedtime.     furosemide (LASIX) 80 MG tablet Take 160 mg (2 tabs) in AM and 80 mg (1 tab) in PM     hydrALAZINE (APRESOLINE) 10 MG tablet Take 1 tablet (10 mg total) by mouth 3 (three) times daily. 90 tablet 2   insulin lispro (HUMALOG) 100 UNIT/ML injection Inject 0-15 Units into the skin See admin instructions. Per sliding scale with meals (about 3 times a day)     isosorbide mononitrate (IMDUR) 30 MG 24 hr tablet Take 1 tablet (30 mg total) by mouth daily. 30 tablet 2   oxyCODONE (OXY IR/ROXICODONE) 5 MG immediate release tablet Take 5 mg by mouth 2 (two) times daily as needed for pain.     Tamsulosin HCl (FLOMAX) 0.4 MG CAPS Take 0.4 mg by mouth at bedtime.      zolpidem (AMBIEN) 10 MG tablet Take 10 mg by mouth at bedtime.     No current facility-administered medications for this encounter.    Vitals:   06/04/22 0905   BP: 120/76  Pulse: (!) 58  SpO2: 100%  Weight: 88.4 kg (194 lb 12.8 oz)   Wt Readings from Last 3 Encounters:  06/04/22 88.4 kg (194 lb 12.8 oz)  05/20/22 89.1 kg (196 lb 6.4 oz)  05/08/22 94.9 kg (209 lb 3.2 oz)    PHYSICAL EXAM: General:  Walking in the clinic.  No resp difficulty HEENT: normal Neck: supple. no JVD. Carotids 2+ bilat; no bruits. No lymphadenopathy or thryomegaly appreciated. Cor: PMI nondisplaced. Regular rate & rhythm. No rubs, gallops or murmurs. Lungs: clear Abdomen: soft, nontender, nondistended. No hepatosplenomegaly. No bruits or masses. Good bowel sounds. Extremities: no cyanosis, clubbing, rash, edema Neuro: alert & orientedx3, cranial nerves grossly intact. moves all 4 extremities w/o difficulty. Affect pleasant  ASSESSMENT & PLAN:  1. Chronic systolic CHF -EF 06% on echo in September 2018 -Echo 03/21: EF 55-60%, RV okay, mild MR, mild TR, IVC dilated -Echo 03/23: EF 45-50% with mild global hypokinesis of LV, mild LVH, RV cannot be obsessed d/t poor image quality, moderate LAE, mild MR -Echo 09/23: EF 30-35%, grade III DD, RV okay, RVSP 59 mmHg, dilated IVC.  -Etiology uncertain. Presumed to have CAD. Unable to complete LHC d/t renal impairment. Had afib in the past no recurrence documented recently to explain decline in LV function. Repeat ECHO in 3-4 months.  -NYHA III.Volume status stable. Continue 160  mg in am and 80 mg in pm with extra 80 mg as needed. . Dosing per Nephrology -GDMT limited by CKD and hypotension. No spiro/arni -Continue lower dose of imdur 30 mg per hour + hydralazine 10 TID. He was instructed to call with SBP 110. Dizziness resolved.  -Continue Coreg 6.25 BID. Heart rate < 60 no up titration.  -He would not be a candidate for advanced therapies with CKD IV and liver cirrhosis   2. Paroxysmal atrial fibrillation -Had afib back in 2018 in setting of gastroenteritis and underwent cardioversion. Has been off anticoagulation since  2019 -Discussed with Dr. Aundra Dubin. Would be hesitant to add anticoagulation at this point given his chronic thrombocytopenia (platelets 60-70K) unless he had recurrence _ Regular on exam.  -Continues on amiodarone 200 mg daily   3. CKD V -No longer on HD, he is adamant that he would not want to restart HD in the future -Scr baseline mid 4s.    4. HTN -Stable.    5. Presumed CAD -Nuclear stress test at [facility] 02/2019 with evidence of old inferior infarct and mild reversibility in anterior wall.  -Had CP and mildly elevated troponin while admitted to [facility] March 2021.  - no chest pain.  - Medically managed due to CKD.  -Continue aspirin and statin   6. Liver cirrhosis -s/p paracentesis earlier this month -Reports he has GI f/u next month   7. 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 GOC:  -He has multiorgan dysfunction. Worry about overall prognosis. Would consider palliative care involvement. -Limited options for treatment of heart failure. Would not be a candidate for advanced therapies   Follow up as needed.   Marshall Kampf NP-C   9:32 AM

## 2022-06-04 ENCOUNTER — Encounter (HOSPITAL_COMMUNITY): Payer: Self-pay

## 2022-06-04 ENCOUNTER — Telehealth (HOSPITAL_COMMUNITY): Payer: Self-pay | Admitting: *Deleted

## 2022-06-04 ENCOUNTER — Ambulatory Visit (HOSPITAL_COMMUNITY)
Admission: RE | Admit: 2022-06-04 | Discharge: 2022-06-04 | Disposition: A | Discharge status: Home / Self Care | Payer: Medicare Other | Source: Ambulatory Visit | Attending: Adult Health | Admitting: Adult Health

## 2022-06-04 VITALS — BP 120/76 | HR 58 | Wt 194.8 lb

## 2022-06-04 DIAGNOSIS — D631 Anemia in chronic kidney disease: Secondary | ICD-10-CM | POA: Diagnosis not present

## 2022-06-04 DIAGNOSIS — Z79899 Other long term (current) drug therapy: Secondary | ICD-10-CM | POA: Diagnosis not present

## 2022-06-04 DIAGNOSIS — Z7982 Long term (current) use of aspirin: Secondary | ICD-10-CM | POA: Insufficient documentation

## 2022-06-04 DIAGNOSIS — I252 Old myocardial infarction: Secondary | ICD-10-CM | POA: Diagnosis not present

## 2022-06-04 DIAGNOSIS — R079 Chest pain, unspecified: Secondary | ICD-10-CM | POA: Insufficient documentation

## 2022-06-04 DIAGNOSIS — I2510 Atherosclerotic heart disease of native coronary artery without angina pectoris: Secondary | ICD-10-CM

## 2022-06-04 DIAGNOSIS — E1122 Type 2 diabetes mellitus with diabetic chronic kidney disease: Secondary | ICD-10-CM | POA: Diagnosis not present

## 2022-06-04 DIAGNOSIS — R161 Splenomegaly, not elsewhere classified: Secondary | ICD-10-CM | POA: Diagnosis not present

## 2022-06-04 DIAGNOSIS — I132 Hypertensive heart and chronic kidney disease with heart failure and with stage 5 chronic kidney disease, or end stage renal disease: Secondary | ICD-10-CM | POA: Diagnosis not present

## 2022-06-04 DIAGNOSIS — I5022 Chronic systolic (congestive) heart failure: Secondary | ICD-10-CM | POA: Diagnosis not present

## 2022-06-04 DIAGNOSIS — N185 Chronic kidney disease, stage 5: Secondary | ICD-10-CM | POA: Diagnosis not present

## 2022-06-04 DIAGNOSIS — I480 Paroxysmal atrial fibrillation: Secondary | ICD-10-CM | POA: Insufficient documentation

## 2022-06-04 DIAGNOSIS — K529 Noninfective gastroenteritis and colitis, unspecified: Secondary | ICD-10-CM | POA: Diagnosis not present

## 2022-06-04 DIAGNOSIS — Z8572 Personal history of non-Hodgkin lymphomas: Secondary | ICD-10-CM | POA: Insufficient documentation

## 2022-06-04 DIAGNOSIS — K7460 Unspecified cirrhosis of liver: Secondary | ICD-10-CM | POA: Insufficient documentation

## 2022-06-04 DIAGNOSIS — I959 Hypotension, unspecified: Secondary | ICD-10-CM | POA: Diagnosis not present

## 2022-06-04 DIAGNOSIS — D6959 Other secondary thrombocytopenia: Secondary | ICD-10-CM | POA: Insufficient documentation

## 2022-06-04 NOTE — Progress Notes (Signed)
Medication Samples have been provided to the patient.  Drug name: Eliquis       Strength: 5mg         Qty: 28 tab (2 boxes)  LOT: [id number]  Exp.Date: 4/25  Dosing instructions: take one table, two times daily.   Medication Samples have been provided to the patient.  Drug name: Jardiance       Strength: 10mg         Qty: 14 tab (2 boxes)  LOT: [id number]  Exp.Date: 10/25  Dosing instructions: Take one tablet daily.   The patient has been instructed regarding the correct time, dose, and frequency of taking this medication, including desired effects and most common side effects.    [facility] staff picked up medications (entresto, spiro) from [place] with [facility].   Filled medication box with meds present. Jardiance, Eliquis, Entresto and spironolactone. Instructed patient to DISCARD LOSARTAN at home. Instructed to fill the rest of the pill box from the medications listed on AVS from [facility] TOC appt today.    Pricilla Holm P 10:08 AM 06/04/2022

## 2022-06-04 NOTE — Patient Instructions (Signed)
Medication Changes:  No changes. Continue current regimen.  Special Instructions // Education:  Do the following things EVERYDAY: Weigh yourself in the morning before breakfast. Write it down and keep it in a log. Take your medicines as prescribed Eat low salt foods--Limit salt (sodium) to 2000 mg per day.  Stay as active as you can everyday Limit all fluids for the day to less than 2 liters   Follow-Up in: as needed with [facility] TOC clinic. Keep your scheduled appointment to establish care with Dr. Audie Box at [facility]  in November.

## 2022-06-04 NOTE — Addendum Note (Signed)
Encounter addended by: Domingo Sep, RN on: 06/04/2022 10:13 AM  Actions taken: Clinical Note Signed

## 2022-06-04 NOTE — Telephone Encounter (Signed)
Called to confirm Heart & Vascular Transitions of Care appointment at 9 am on 06/04/2022. Patient reminded to bring all medications and pill box organizer with them. Confirmed patient has transportation. Gave directions, instructed to utilize [address] parking.  Confirmed appointment prior to ending call.    Earnestine Leys, BSN, Clinical cytogeneticist Only

## 2022-06-05 ENCOUNTER — Other Ambulatory Visit: Payer: Medicare Other

## 2022-06-05 ENCOUNTER — Ambulatory Visit: Payer: Medicare Other

## 2022-06-07 ENCOUNTER — Other Ambulatory Visit: Payer: Self-pay | Admitting: Oncology

## 2022-06-07 ENCOUNTER — Inpatient Hospital Stay: Payer: Medicare Other | Admitting: Oncology

## 2022-06-07 ENCOUNTER — Inpatient Hospital Stay: Payer: Medicare Other | Attending: Oncology

## 2022-06-07 VITALS — BP 111/58 | HR 65 | Temp 97.7°F | Resp 16 | Ht 71.0 in | Wt 197.8 lb

## 2022-06-07 DIAGNOSIS — Z79899 Other long term (current) drug therapy: Secondary | ICD-10-CM | POA: Diagnosis not present

## 2022-06-07 DIAGNOSIS — N184 Chronic kidney disease, stage 4 (severe): Secondary | ICD-10-CM | POA: Insufficient documentation

## 2022-06-07 DIAGNOSIS — D631 Anemia in chronic kidney disease: Secondary | ICD-10-CM | POA: Insufficient documentation

## 2022-06-07 DIAGNOSIS — N189 Chronic kidney disease, unspecified: Secondary | ICD-10-CM | POA: Diagnosis not present

## 2022-06-07 DIAGNOSIS — D539 Nutritional anemia, unspecified: Secondary | ICD-10-CM

## 2022-06-07 DIAGNOSIS — D649 Anemia, unspecified: Secondary | ICD-10-CM | POA: Diagnosis not present

## 2022-06-07 DIAGNOSIS — K7469 Other cirrhosis of liver: Secondary | ICD-10-CM | POA: Diagnosis not present

## 2022-06-07 LAB — CBC: RBC: 3.03 — AB (ref 3.87–5.11)

## 2022-06-07 LAB — BASIC METABOLIC PANEL
BUN: 93 — AB (ref 4–21)
CO2: 21 (ref 13–22)
Chloride: 107 (ref 99–108)
Creatinine: 5.8 — AB (ref 0.6–1.3)
Glucose: 151
Potassium: 4.1 mEq/L (ref 3.5–5.1)
Sodium: 139 (ref 137–147)

## 2022-06-07 LAB — COMPREHENSIVE METABOLIC PANEL
Albumin: 3.8 (ref 3.5–5.0)
Calcium: 9.7 (ref 8.7–10.7)
eGFR: 10

## 2022-06-07 LAB — CBC AND DIFFERENTIAL
HCT: 31 — AB (ref 41–53)
Hemoglobin: 10.7 — AB (ref 13.5–17.5)
Neutrophils Absolute: 4.42
Platelets: 79 10*3/uL — AB (ref 150–400)
WBC: 5.6

## 2022-06-07 LAB — IRON AND TIBC
Iron: 81 ug/dL (ref 45–182)
Saturation Ratios: 26 % (ref 17.9–39.5)
TIBC: 316 ug/dL (ref 250–450)
UIBC: 235 ug/dL

## 2022-06-07 LAB — VITAMIN B12: Vitamin B-12: 379 pg/mL (ref 180–914)

## 2022-06-07 LAB — HEPATIC FUNCTION PANEL
ALT: 40 U/L (ref 10–40)
AST: 42 — AB (ref 14–40)
Alkaline Phosphatase: 100 (ref 25–125)
Bilirubin, Total: 1.1

## 2022-06-07 LAB — FERRITIN: Ferritin: 118 ng/mL (ref 24–336)

## 2022-06-07 LAB — TSH: TSH: 1.837 u[IU]/mL (ref 0.350–4.500)

## 2022-06-07 LAB — FOLATE: Folate: 7.2 ng/mL (ref >5.9)

## 2022-06-07 NOTE — Progress Notes (Signed)
[facility]  [address] [place],  [place]  [address] [phone]  Clinic Day:  06/07/2022  Referring physician: Helen Hashimoto., MD   HISTORY OF PRESENT ILLNESS:  The patient is a 61 y.o. male  who I recently began seeing for thrombocytopenia.  Recent scans showed him to have cirrhosis and secondary splenomegaly as being the reasons behind his thrombocytopenia.  Of note, this gentleman also has anemia secondary to chronic renal insufficiency.  He comes in today for routine follow-up.  Since his last visit, the patient has been doing fairly well.  He denies having any new bleeding/bruising issues which concern him for worsening thrombocytopenia.  Although he has a mild degree of fatigue, he denies having any forms of blood loss which concern him for having worsening anemia.  Of note, he is scheduled to see a liver specialist in the forthcoming weeks to discuss his cirrhosis.  PHYSICAL EXAM:  Blood pressure (!) 111/58, pulse 65, temperature 97.7 F (36.5 C), resp. rate 16, height _0  (1.803 m), weight 197 lb 12.8 oz (89.7 kg), SpO2 98 %. Wt Readings from Last 3 Encounters:  06/07/22 197 lb 12.8 oz (89.7 kg)  06/04/22 194 lb 12.8 oz (88.4 kg)  05/20/22 196 lb 6.4 oz (89.1 kg)   Body mass index is 27.59 kg/m. Performance status (ECOG): 1 - Symptomatic but completely ambulatory Physical Exam Constitutional:      Appearance: Normal appearance. He is not ill-appearing.  HENT:     Mouth/Throat:     Mouth: Mucous membranes are moist.     Pharynx: Oropharynx is clear. No oropharyngeal exudate or posterior oropharyngeal erythema.  Cardiovascular:     Rate and Rhythm: Normal rate and regular rhythm.     Heart sounds: No murmur heard.    No friction rub. No gallop.  Pulmonary:     Effort: Pulmonary effort is normal. No respiratory distress.     Breath sounds: Normal breath sounds. No wheezing, rhonchi or rales.  Abdominal:     General: Bowel  sounds are normal. There is distension.     Palpations: Abdomen is soft. There is no mass.     Tenderness: There is no abdominal tenderness.  Musculoskeletal:        General: No swelling.     Right lower leg: No edema.     Left lower leg: No edema.  Lymphadenopathy:     Cervical: No cervical adenopathy.     Upper Body:     Right upper body: No supraclavicular or axillary adenopathy.     Left upper body: No supraclavicular or axillary adenopathy.     Lower Body: No right inguinal adenopathy. No left inguinal adenopathy.  Skin:    General: Skin is warm.     Coloration: Skin is not jaundiced.     Findings: No lesion or rash.  Neurological:     General: No focal deficit present.     Mental Status: He is alert and oriented to person, place, and time. Mental status is at baseline.  Psychiatric:        Mood and Affect: Mood normal.        Behavior: Behavior normal.        Thought Content: Thought content normal.    LABS:      Latest Ref Rng & Units 06/07/2022   12:00 AM 05/05/2022   10:31 PM 05/05/2022    9:33 PM  CBC  WBC  5.6  4.5   Hemoglobin 13.5 - 17.5 10.7     9.5  10.1   Hematocrit 41 - 53 31     28.0  29.4   Platelets 150 - 400 K/uL 79      71      This result is from an external source.    Latest Reference Range & Units 06/07/22 00:00  Sodium 137 - 147  139 (E)  Potassium 3.5 - 5.1 mEq/L 4.1 (E)  Chloride 99 - 108  107 (E)  CO2 13 - 22  21 (E)  Glucose  151 (E)  BUN 4 - 21  93 ! (E)  Creatinine 0.6 - 1.3  5.8 ! (E)  Calcium 8.7 - 10.7  9.7 (E)  eGFR  10 (E)  Alkaline Phosphatase 25 - 125  100 (E)  Albumin 3.5 - 5.0  3.8 (E)  AST 14 - 40  42 ! (E)  ALT 10 - 40 U/L 40 (E)  Bilirubin, Total  1.1 (E)  !: Data is abnormal (E): External lab result   Latest Reference Range & Units 06/07/22 10:27  Iron 45 - 182 ug/dL 81  UIBC ug/dL 235  TIBC 250 - 450 ug/dL 316  Saturation Ratios 17.9 - 39.5 % 26  Ferritin 24 - 336 ng/mL 118  Folate >5.9 ng/mL 7.2  Vitamin  B12 180 - 914 pg/mL 379   ASSESSMENT & PLAN:  A 61 y.o. male with anemia secondary to chronic renal insufficiency.  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  I am pleased that his hemoglobin has risen above 10.  This likely reflects the benefits from his Retacrit shots, which he has been receiving monthly.  Nevertheless, his renal function remains very severe, with him being on the precipice of needing dialysis again.    Once again, his labs today do not show any type of nutritional deficiencies factoring into his anemia.  As it pertains to his thrombocytopenia, his platelet count of 79 is decent.  This will continue to be followed conservatively.  However, I did explain to the patient that his cirrhosis is irreversible.  Moving forward, he needs to do whatever necessary to prevent his liver function from getting even worse over time.  That includes keeping his diabetes and weight under much better control.  Doing these things will also help prevent him from developing end-stage renal disease.  I will see him back in 3 months for repeat clinical assessment.  The patient understands all the plans discussed today and is in agreement with them.  Irl Bodie Macarthur Critchley, MD

## 2022-06-09 ENCOUNTER — Encounter: Payer: Self-pay | Admitting: Oncology

## 2022-06-10 ENCOUNTER — Telehealth: Payer: Self-pay | Admitting: Oncology

## 2022-06-10 ENCOUNTER — Inpatient Hospital Stay: Payer: Medicare Other

## 2022-06-10 NOTE — Telephone Encounter (Signed)
Per patient 10/9, His levels were over 10 on 10/6 - Per Dr Bobby Rumpf he does not need Retacrit Injection.  Canceled Appt

## 2022-06-10 NOTE — Telephone Encounter (Signed)
Patient has been scheduled. Aware of appt date and time   Scheduling Message Entered by Lavera Guise A on 06/09/2022 at  8:32 AM Priority: Routine <No visit type provided>  Department: [facility]  Provider:   Scheduling Notes:  Please have pt see me back on 09-06-22 with labs ( instead of April 2024)

## 2022-06-14 ENCOUNTER — Other Ambulatory Visit: Payer: Medicare Other

## 2022-06-14 ENCOUNTER — Ambulatory Visit: Payer: Medicare Other | Admitting: Oncology

## 2022-06-14 DIAGNOSIS — I2089 Other forms of angina pectoris: Secondary | ICD-10-CM | POA: Diagnosis not present

## 2022-06-14 DIAGNOSIS — N185 Chronic kidney disease, stage 5: Secondary | ICD-10-CM | POA: Diagnosis not present

## 2022-06-14 DIAGNOSIS — I130 Hypertensive heart and chronic kidney disease with heart failure and stage 1 through stage 4 chronic kidney disease, or unspecified chronic kidney disease: Secondary | ICD-10-CM | POA: Diagnosis not present

## 2022-06-14 DIAGNOSIS — K7460 Unspecified cirrhosis of liver: Secondary | ICD-10-CM | POA: Diagnosis not present

## 2022-06-14 DIAGNOSIS — E1122 Type 2 diabetes mellitus with diabetic chronic kidney disease: Secondary | ICD-10-CM | POA: Diagnosis not present

## 2022-06-14 DIAGNOSIS — I509 Heart failure, unspecified: Secondary | ICD-10-CM | POA: Diagnosis not present

## 2022-06-14 DIAGNOSIS — N2581 Secondary hyperparathyroidism of renal origin: Secondary | ICD-10-CM | POA: Diagnosis not present

## 2022-06-17 DIAGNOSIS — J449 Chronic obstructive pulmonary disease, unspecified: Secondary | ICD-10-CM | POA: Diagnosis not present

## 2022-06-17 DIAGNOSIS — N184 Chronic kidney disease, stage 4 (severe): Secondary | ICD-10-CM | POA: Diagnosis not present

## 2022-06-17 DIAGNOSIS — I509 Heart failure, unspecified: Secondary | ICD-10-CM | POA: Diagnosis not present

## 2022-06-17 DIAGNOSIS — K7460 Unspecified cirrhosis of liver: Secondary | ICD-10-CM | POA: Diagnosis not present

## 2022-06-19 DIAGNOSIS — K7460 Unspecified cirrhosis of liver: Secondary | ICD-10-CM | POA: Diagnosis not present

## 2022-06-29 ENCOUNTER — Other Ambulatory Visit: Payer: Self-pay | Admitting: Pharmacist

## 2022-07-01 ENCOUNTER — Other Ambulatory Visit (HOSPITAL_COMMUNITY): Payer: Self-pay | Admitting: Internal Medicine

## 2022-07-01 DIAGNOSIS — R609 Edema, unspecified: Secondary | ICD-10-CM
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
## 2022-07-03 ENCOUNTER — Encounter: Payer: Self-pay | Admitting: Cardiovascular Disease

## 2022-07-03 ENCOUNTER — Ambulatory Visit: Payer: Medicare Other | Attending: Cardiovascular Disease | Admitting: Cardiovascular Disease

## 2022-07-03 VITALS — BP 100/60 | HR 58 | Ht 71.0 in | Wt 206.4 lb

## 2022-07-03 DIAGNOSIS — I480 Paroxysmal atrial fibrillation: Secondary | ICD-10-CM | POA: Diagnosis not present

## 2022-07-03 DIAGNOSIS — I2510 Atherosclerotic heart disease of native coronary artery without angina pectoris: Secondary | ICD-10-CM | POA: Diagnosis not present

## 2022-07-03 DIAGNOSIS — I5022 Chronic systolic (congestive) heart failure: Secondary | ICD-10-CM

## 2022-07-03 DIAGNOSIS — I10 Essential (primary) hypertension: Secondary | ICD-10-CM | POA: Diagnosis not present

## 2022-07-03 NOTE — Patient Instructions (Addendum)
Medication Instructions:  STOP Coreg  STOP Insulin   *If you need a refill on your cardiac medications before your next   Follow-Up: At [facility], you and your health needs are our priority.  As part of our continuing mission to provide you with exceptional heart care, we have created designated Provider Care Teams.  These Care Teams include your primary Cardiologist (physician) and Advanced Practice Providers (APPs -  Physician Assistants and Nurse Practitioners) who all work together to provide you with the care you need, when you need it.  We recommend signing up for the patient portal called "MyChart".  Sign up information is provided on this After Visit Summary.  MyChart is used to connect with patients for Virtual Visits (Telemedicine).  Patients are able to view lab/test results, encounter notes, upcoming appointments, etc.  Non-urgent messages can be sent to your provider as well.   To learn more about what you can do with MyChart, go to [email or web address].    Your next appointment:   As needed  The format for your next appointment:   In Person  Provider:   Eleonore Chiquito, MD

## 2022-07-04 ENCOUNTER — Ambulatory Visit (HOSPITAL_COMMUNITY)
Admission: RE | Admit: 2022-07-04 | Discharge: 2022-07-04 | Disposition: A | Discharge status: Home / Self Care | Source: Ambulatory Visit | Attending: Internal Medicine | Admitting: Internal Medicine

## 2022-07-04 DIAGNOSIS — R609 Edema, unspecified: Secondary | ICD-10-CM | POA: Insufficient documentation

## 2022-07-04 DIAGNOSIS — R188 Other ascites: Secondary | ICD-10-CM | POA: Diagnosis not present

## 2022-07-04 DIAGNOSIS — N289 Disorder of kidney and ureter, unspecified: Secondary | ICD-10-CM | POA: Diagnosis not present

## 2022-07-04 HISTORY — PX: IR PARACENTESIS: IMG2679

## 2022-07-04 MED ORDER — LIDOCAINE HCL 1 % IJ SOLN
INTRAMUSCULAR | Status: AC
  Administered 2022-07-04: 10 mL
  Filled 2022-07-04: qty 20

## 2022-07-04 NOTE — Procedures (Signed)
PROCEDURE SUMMARY:  Successful image-guided paracentesis from the left lower abdomen.  Yielded 3 liters of hazy yellow fluid.  No immediate complications.  EBL = trace. Patient tolerated well.   Specimen was not sent for labs.  Please see imaging section of Epic for full dictation.   Lura Em PA-C 07/04/2022 11:43 AM

## 2022-07-08 ENCOUNTER — Ambulatory Visit: Payer: Medicare Other

## 2022-07-08 ENCOUNTER — Inpatient Hospital Stay: Payer: Medicare Other

## 2022-07-09 ENCOUNTER — Other Ambulatory Visit (HOSPITAL_COMMUNITY): Payer: Self-pay | Admitting: Physician Assistant

## 2022-07-09 ENCOUNTER — Inpatient Hospital Stay: Payer: Medicare Other

## 2022-08-02 DEATH — deceased

## 2022-08-05 ENCOUNTER — Ambulatory Visit: Payer: Medicare Other

## 2022-08-05 ENCOUNTER — Other Ambulatory Visit: Payer: Medicare Other

## 2022-08-06 ENCOUNTER — Ambulatory Visit: Payer: Medicare Other

## 2022-08-14 ENCOUNTER — Other Ambulatory Visit (HOSPITAL_COMMUNITY): Payer: Self-pay | Admitting: Physician Assistant

## 2022-09-05 ENCOUNTER — Other Ambulatory Visit: Payer: Medicare Other

## 2022-09-05 ENCOUNTER — Ambulatory Visit: Payer: Medicare Other | Admitting: Oncology

## 2022-12-06 ENCOUNTER — Other Ambulatory Visit: Payer: Medicare Other

## 2022-12-06 ENCOUNTER — Ambulatory Visit: Payer: Medicare Other | Admitting: Oncology
